# Patient Record
Sex: Male | Born: 1940
Health system: Southern US, Community
[De-identification: ages and names within clinical notes are randomized; demographics above are authoritative.]

## PROBLEM LIST (undated history)

## (undated) DIAGNOSIS — I10 Essential (primary) hypertension: Secondary | ICD-10-CM

## (undated) DIAGNOSIS — E119 Type 2 diabetes mellitus without complications: Secondary | ICD-10-CM

## (undated) DIAGNOSIS — I82409 Acute embolism and thrombosis of unspecified deep veins of unspecified lower extremity: Secondary | ICD-10-CM

## (undated) DIAGNOSIS — N183 Chronic kidney disease, stage 3 unspecified: Secondary | ICD-10-CM

## (undated) DIAGNOSIS — K219 Gastro-esophageal reflux disease without esophagitis: Secondary | ICD-10-CM

## (undated) DIAGNOSIS — Z794 Long term (current) use of insulin: Secondary | ICD-10-CM

## (undated) DIAGNOSIS — IMO0001 Reserved for inherently not codable concepts without codable children: Secondary | ICD-10-CM

## (undated) DIAGNOSIS — D472 Monoclonal gammopathy: Secondary | ICD-10-CM

## (undated) DIAGNOSIS — C61 Malignant neoplasm of prostate: Secondary | ICD-10-CM

## (undated) DIAGNOSIS — I5032 Chronic diastolic (congestive) heart failure: Secondary | ICD-10-CM

## (undated) DIAGNOSIS — E7800 Pure hypercholesterolemia, unspecified: Secondary | ICD-10-CM

## (undated) HISTORY — DX: Gastro-esophageal reflux disease without esophagitis: K21.9

## (undated) HISTORY — PX: PROSTATECTOMY: SHX69

---

## 2013-03-07 DIAGNOSIS — C61 Malignant neoplasm of prostate: Secondary | ICD-10-CM

## 2013-03-07 HISTORY — DX: Malignant neoplasm of prostate: C61

## 2015-02-18 ENCOUNTER — Emergency Department
Admission: EM | Admit: 2015-02-18 | Discharge: 2015-02-18 | Disposition: A | Discharge status: Home / Self Care | Payer: Medicare (Managed Care) | Attending: Emergency Medicine | Admitting: Emergency Medicine

## 2015-02-18 ENCOUNTER — Emergency Department: Payer: Medicare (Managed Care)

## 2015-02-18 DIAGNOSIS — E119 Type 2 diabetes mellitus without complications: Secondary | ICD-10-CM | POA: Diagnosis not present

## 2015-02-18 DIAGNOSIS — Z794 Long term (current) use of insulin: Secondary | ICD-10-CM | POA: Insufficient documentation

## 2015-02-18 DIAGNOSIS — Z79899 Other long term (current) drug therapy: Secondary | ICD-10-CM | POA: Insufficient documentation

## 2015-02-18 DIAGNOSIS — M545 Low back pain: Secondary | ICD-10-CM | POA: Diagnosis present

## 2015-02-18 DIAGNOSIS — I10 Essential (primary) hypertension: Secondary | ICD-10-CM | POA: Diagnosis not present

## 2015-02-18 DIAGNOSIS — M5442 Lumbago with sciatica, left side: Secondary | ICD-10-CM | POA: Insufficient documentation

## 2015-02-18 HISTORY — DX: Malignant neoplasm of prostate: C61

## 2015-02-18 HISTORY — DX: Pure hypercholesterolemia, unspecified: E78.00

## 2015-02-18 HISTORY — DX: Essential (primary) hypertension: I10

## 2015-02-18 HISTORY — DX: Acute embolism and thrombosis of unspecified deep veins of unspecified lower extremity: I82.409

## 2015-02-18 MED ORDER — TRAMADOL HCL 50 MG PO TABS: 50.0000 mg | ORAL_TABLET | Freq: Four times a day (QID) | ORAL | Status: DC | PRN

## 2015-02-18 NOTE — ED Provider Notes (Signed)
[facility] Emergency Department Provider Note  ____________________________________________  Time seen: Approximately 11:27 AM  I have reviewed the triage vital signs and the nursing notes.   HISTORY  Chief Complaint Back Pain  HPI Jesse Dickson is a 74 y.o. male complaint of back pain for 3 years. Patient states that currently his left lower back hurts with radiation to his left leg for the past 2-3 weeks. He denies any injury to his back. He states that he has had back pain for approximately 3 years and was seeing someone in [place]. He states that his back was x-rayed one year ago that he was never told what was wrong with his back. Family member states that he was told to exercise and not to sit for long periods of time. Patient has not been taking any over-the-counter medication at home for his pain. He denies any urinary symptoms, fever, chills, nausea. Currently he rates his pain as a 10 out of 10.   Past Medical History  Diagnosis Date  . Hypertension   . Diabetes mellitus without complication ([place])   . CHF (congestive heart failure) ([place])   . DVT (deep venous thrombosis) ([place])   . Hypercholesteremia   . Prostate CA ([place])     There are no active problems to display for this patient.   Past Surgical History  Procedure Laterality Date  . Prostatectomy      Current Outpatient Rx  Name  Route  Sig  Dispense  Refill  . atorvastatin (LIPITOR) 20 MG tablet   Oral   Take 20 mg by mouth daily.         . carvedilol (COREG) 25 MG tablet   Oral   Take 25 mg by mouth 2 (two) times daily.         Marland Kitchen docusate sodium (COLACE) 100 MG capsule   Oral   Take 100 mg by mouth 2 (two) times daily.         . furosemide (LASIX) 40 MG tablet   Oral   Take 40 mg by mouth 2 (two) times daily.         . insulin aspart (NOVOLOG) 100 UNIT/ML injection   Subcutaneous   Inject into the skin 3 (three) times daily before meals. Sliding scale coverage          . insulin glargine (LANTUS) 100 UNIT/ML injection   Subcutaneous   Inject 20 Units into the skin at bedtime.         . tamsulosin (FLOMAX) 0.4 MG CAPS capsule   Oral   Take 0.4 mg by mouth at bedtime.         . traMADol (ULTRAM) 50 MG tablet   Oral   Take 1 tablet (50 mg total) by mouth every 6 (six) hours as needed.   20 tablet   0     Allergies Review of patient's allergies indicates not on file.  No family history on file.  Social History Social History  Substance Use Topics  . Smoking status: Never Smoker   . Smokeless tobacco: None  . Alcohol Use: Yes    Review of Systems Constitutional: No fever/chills Eyes: No visual changes. ENT: No sore throat. Cardiovascular: Denies chest pain. Respiratory: Denies shortness of breath. Gastrointestinal: No abdominal pain.  No nausea, no vomiting.   Genitourinary: Negative for dysuria. Musculoskeletal: Positive for back pain. Skin: Negative for rash. Neurological: Negative for headaches. Positive left leg radicular pain  10-point ROS otherwise negative.  ____________________________________________   PHYSICAL EXAM:  VITAL SIGNS: ED Triage Vitals  Enc Vitals Group     BP 02/18/15 1052 132/55 mmHg     Pulse Rate 02/18/15 1052 79     Resp 02/18/15 1052 16     Temp 02/18/15 1052 97.8 F (36.6 C)     Temp Source 02/18/15 1052 Oral     SpO2 02/18/15 1052 100 %     Weight 02/18/15 1052 190 lb (86.183 kg)     Height 02/18/15 1052 5\' 8"  (1.727 m)     Head Cir --      Peak Flow --      Pain Score 02/18/15 1105 10     Pain Loc --      Pain Edu? --      Excl. in [place]? --     Constitutional: Alert and oriented. Well appearing and in no acute distress. Eyes: Conjunctivae are normal. PERRL. EOMI. Head: Atraumatic. Nose: No congestion/rhinnorhea. Mouth/Throat: Mucous membranes are moist.  Oropharynx non-erythematous. Neck: No stridor.  Moderate tenderness on palpation of cervical spine posteriorly and  trapezius muscle to the left. Range of motion is restricted secondary to pain. Hematological/Lymphatic/Immunilogical: No cervical lymphadenopathy. Cardiovascular: Normal rate, regular rhythm. Grossly normal heart sounds.  Good peripheral circulation. Respiratory: Normal respiratory effort.  No retractions. Lungs CTAB. Gastrointestinal: Soft and nontender. No distention.  Musculoskeletal: No lower extremity tenderness nor edema.  No joint effusions. Neurologic:  Normal speech and language. No gross focal neurologic deficits are appreciated.  Reflexes are 1+ bilaterally. Skin:  Skin is warm, dry and intact. No rash noted. Psychiatric: Mood and affect are normal. Speech and behavior are normal.  ____________________________________________   LABS (all labs ordered are listed, but only abnormal results are displayed)  Labs Reviewed - No data to display   RADIOLOGY  Lumbar spine x-ray per radiologist shows L5-S1 disc space loss. No acute findings were seen. ____________________________________________   PROCEDURES  Procedure(s) performed: None  Critical Care performed: No  ____________________________________________   INITIAL IMPRESSION / ASSESSMENT AND PLAN / ED COURSE  Pertinent labs & imaging results that were available during my care of the patient were reviewed by me and considered in my medical decision making (see chart for details).  Patient was placed tramadol as needed for pain. He was given the name of the orthopedist on call to call and make an appointment since he does not have a PCP in this area.  Appointment with Dr. Marry Guan, he is encouraged to call. ____________________________________________   FINAL CLINICAL IMPRESSION(S) / ED DIAGNOSES  Final diagnoses:  Left-sided low back pain with left-sided sciatica      Johnn Hai, PA-C 02/18/15 1512  Eula Listen, MD 02/20/15 [phone]

## 2015-02-18 NOTE — Discharge Instructions (Signed)
You will need to  make an appointment with Dr. Marry Guan about your chronic back pain. Take tramadol as needed for pain. Continued to move frequently. You may continue using icy hot to your back but do not apply heating pad when using this ointment.

## 2015-02-18 NOTE — ED Notes (Signed)
Pt c/o left lower back pain that radiates into leg for the past couple of weeks.Jesse Dickson

## 2015-04-08 ENCOUNTER — Other Ambulatory Visit: Payer: Self-pay | Admitting: Orthopedic Surgery

## 2015-04-08 DIAGNOSIS — M5416 Radiculopathy, lumbar region: Secondary | ICD-10-CM

## 2015-04-08 DIAGNOSIS — M5136 Other intervertebral disc degeneration, lumbar region: Secondary | ICD-10-CM

## 2015-04-28 ENCOUNTER — Ambulatory Visit: Payer: Medicare (Managed Care)

## 2015-05-14 DIAGNOSIS — E7800 Pure hypercholesterolemia, unspecified: Secondary | ICD-10-CM | POA: Diagnosis not present

## 2015-05-14 DIAGNOSIS — I5022 Chronic systolic (congestive) heart failure: Secondary | ICD-10-CM | POA: Diagnosis not present

## 2015-05-14 DIAGNOSIS — E119 Type 2 diabetes mellitus without complications: Secondary | ICD-10-CM | POA: Diagnosis not present

## 2015-05-14 DIAGNOSIS — Z794 Long term (current) use of insulin: Secondary | ICD-10-CM | POA: Diagnosis not present

## 2015-05-14 DIAGNOSIS — M5442 Lumbago with sciatica, left side: Secondary | ICD-10-CM | POA: Diagnosis not present

## 2015-05-15 DIAGNOSIS — Z794 Long term (current) use of insulin: Secondary | ICD-10-CM | POA: Diagnosis not present

## 2015-05-15 DIAGNOSIS — E119 Type 2 diabetes mellitus without complications: Secondary | ICD-10-CM | POA: Diagnosis not present

## 2015-05-18 DIAGNOSIS — N183 Chronic kidney disease, stage 3 unspecified: Secondary | ICD-10-CM | POA: Insufficient documentation

## 2015-05-18 DIAGNOSIS — D638 Anemia in other chronic diseases classified elsewhere: Secondary | ICD-10-CM | POA: Insufficient documentation

## 2015-05-22 DIAGNOSIS — I5022 Chronic systolic (congestive) heart failure: Secondary | ICD-10-CM | POA: Diagnosis not present

## 2015-06-02 ENCOUNTER — Ambulatory Visit
Admission: RE | Admit: 2015-06-02 | Discharge: 2015-06-02 | Disposition: A | Discharge status: Home / Self Care | Payer: Medicare Other | Source: Ambulatory Visit | Attending: Orthopedic Surgery | Admitting: Orthopedic Surgery

## 2015-06-02 DIAGNOSIS — M5416 Radiculopathy, lumbar region: Secondary | ICD-10-CM

## 2015-06-02 DIAGNOSIS — M5136 Other intervertebral disc degeneration, lumbar region: Secondary | ICD-10-CM | POA: Diagnosis not present

## 2015-06-02 DIAGNOSIS — M4806 Spinal stenosis, lumbar region: Secondary | ICD-10-CM | POA: Diagnosis not present

## 2015-06-02 DIAGNOSIS — M5186 Other intervertebral disc disorders, lumbar region: Secondary | ICD-10-CM | POA: Diagnosis not present

## 2015-06-02 DIAGNOSIS — M5126 Other intervertebral disc displacement, lumbar region: Secondary | ICD-10-CM | POA: Diagnosis not present

## 2016-03-15 DIAGNOSIS — N183 Chronic kidney disease, stage 3 (moderate): Secondary | ICD-10-CM | POA: Diagnosis not present

## 2016-03-25 DIAGNOSIS — E1122 Type 2 diabetes mellitus with diabetic chronic kidney disease: Secondary | ICD-10-CM | POA: Diagnosis not present

## 2016-03-25 DIAGNOSIS — D631 Anemia in chronic kidney disease: Secondary | ICD-10-CM | POA: Diagnosis not present

## 2016-03-25 DIAGNOSIS — N183 Chronic kidney disease, stage 3 (moderate): Secondary | ICD-10-CM | POA: Diagnosis not present

## 2016-03-25 DIAGNOSIS — I10 Essential (primary) hypertension: Secondary | ICD-10-CM | POA: Diagnosis not present

## 2016-04-08 DIAGNOSIS — Z794 Long term (current) use of insulin: Secondary | ICD-10-CM | POA: Diagnosis not present

## 2016-04-08 DIAGNOSIS — E119 Type 2 diabetes mellitus without complications: Secondary | ICD-10-CM | POA: Diagnosis not present

## 2016-04-08 DIAGNOSIS — D638 Anemia in other chronic diseases classified elsewhere: Secondary | ICD-10-CM | POA: Diagnosis not present

## 2016-04-08 DIAGNOSIS — M5442 Lumbago with sciatica, left side: Secondary | ICD-10-CM | POA: Diagnosis not present

## 2016-04-08 DIAGNOSIS — G8929 Other chronic pain: Secondary | ICD-10-CM | POA: Diagnosis not present

## 2016-04-08 DIAGNOSIS — E7800 Pure hypercholesterolemia, unspecified: Secondary | ICD-10-CM | POA: Diagnosis not present

## 2016-04-08 DIAGNOSIS — N183 Chronic kidney disease, stage 3 (moderate): Secondary | ICD-10-CM | POA: Diagnosis not present

## 2016-06-20 DIAGNOSIS — E119 Type 2 diabetes mellitus without complications: Secondary | ICD-10-CM | POA: Diagnosis not present

## 2016-06-20 DIAGNOSIS — C61 Malignant neoplasm of prostate: Secondary | ICD-10-CM | POA: Diagnosis not present

## 2016-06-20 DIAGNOSIS — E784 Other hyperlipidemia: Secondary | ICD-10-CM | POA: Diagnosis not present

## 2016-06-20 DIAGNOSIS — R0602 Shortness of breath: Secondary | ICD-10-CM | POA: Diagnosis not present

## 2016-06-20 DIAGNOSIS — I10 Essential (primary) hypertension: Secondary | ICD-10-CM | POA: Diagnosis not present

## 2016-06-20 DIAGNOSIS — Z125 Encounter for screening for malignant neoplasm of prostate: Secondary | ICD-10-CM | POA: Diagnosis not present

## 2016-06-20 DIAGNOSIS — R5381 Other malaise: Secondary | ICD-10-CM | POA: Diagnosis not present

## 2016-06-20 DIAGNOSIS — R079 Chest pain, unspecified: Secondary | ICD-10-CM | POA: Diagnosis not present

## 2016-06-20 DIAGNOSIS — M5440 Lumbago with sciatica, unspecified side: Secondary | ICD-10-CM | POA: Diagnosis not present

## 2016-06-22 DIAGNOSIS — M5440 Lumbago with sciatica, unspecified side: Secondary | ICD-10-CM | POA: Diagnosis not present

## 2016-06-22 DIAGNOSIS — C61 Malignant neoplasm of prostate: Secondary | ICD-10-CM | POA: Diagnosis not present

## 2016-06-22 DIAGNOSIS — R079 Chest pain, unspecified: Secondary | ICD-10-CM | POA: Diagnosis not present

## 2016-07-05 DIAGNOSIS — M5440 Lumbago with sciatica, unspecified side: Secondary | ICD-10-CM | POA: Diagnosis not present

## 2016-07-05 DIAGNOSIS — C61 Malignant neoplasm of prostate: Secondary | ICD-10-CM | POA: Diagnosis not present

## 2016-07-05 DIAGNOSIS — R0602 Shortness of breath: Secondary | ICD-10-CM | POA: Diagnosis not present

## 2016-08-04 DIAGNOSIS — E875 Hyperkalemia: Secondary | ICD-10-CM | POA: Diagnosis not present

## 2016-08-04 DIAGNOSIS — D631 Anemia in chronic kidney disease: Secondary | ICD-10-CM | POA: Diagnosis not present

## 2016-08-04 DIAGNOSIS — N2581 Secondary hyperparathyroidism of renal origin: Secondary | ICD-10-CM | POA: Diagnosis not present

## 2016-08-04 DIAGNOSIS — I10 Essential (primary) hypertension: Secondary | ICD-10-CM | POA: Diagnosis not present

## 2016-08-04 DIAGNOSIS — N183 Chronic kidney disease, stage 3 (moderate): Secondary | ICD-10-CM | POA: Diagnosis not present

## 2016-08-06 ENCOUNTER — Emergency Department
Admission: EM | Admit: 2016-08-06 | Discharge: 2016-08-06 | Disposition: A | Discharge status: Home / Self Care | Payer: Medicare HMO | Attending: Emergency Medicine | Admitting: Emergency Medicine

## 2016-08-06 ENCOUNTER — Encounter: Payer: Self-pay | Admitting: Emergency Medicine

## 2016-08-06 DIAGNOSIS — Z79899 Other long term (current) drug therapy: Secondary | ICD-10-CM | POA: Insufficient documentation

## 2016-08-06 DIAGNOSIS — Z794 Long term (current) use of insulin: Secondary | ICD-10-CM | POA: Insufficient documentation

## 2016-08-06 DIAGNOSIS — I110 Hypertensive heart disease with heart failure: Secondary | ICD-10-CM | POA: Diagnosis not present

## 2016-08-06 DIAGNOSIS — Z8546 Personal history of malignant neoplasm of prostate: Secondary | ICD-10-CM | POA: Diagnosis not present

## 2016-08-06 DIAGNOSIS — I509 Heart failure, unspecified: Secondary | ICD-10-CM | POA: Diagnosis not present

## 2016-08-06 DIAGNOSIS — R319 Hematuria, unspecified: Secondary | ICD-10-CM | POA: Diagnosis not present

## 2016-08-06 DIAGNOSIS — E119 Type 2 diabetes mellitus without complications: Secondary | ICD-10-CM | POA: Insufficient documentation

## 2016-08-06 LAB — CBC
HCT: 28 % — ABNORMAL LOW (ref 40.0–52.0)
HEMOGLOBIN: 9.2 g/dL — AB (ref 13.0–18.0)
MCH: 29.9 pg (ref 26.0–34.0)
MCHC: 32.9 g/dL (ref 32.0–36.0)
MCV: 90.9 fL (ref 80.0–100.0)
Platelets: 204 10*3/uL (ref 150–440)
RBC: 3.08 MIL/uL — ABNORMAL LOW (ref 4.40–5.90)
RDW: 12.8 % (ref 11.5–14.5)
WBC: 6.1 10*3/uL (ref 3.8–10.6)

## 2016-08-06 LAB — COMPREHENSIVE METABOLIC PANEL
ALBUMIN: 3.9 g/dL (ref 3.5–5.0)
ALK PHOS: 91 U/L (ref 38–126)
ALT: 21 U/L (ref 17–63)
ANION GAP: 6 (ref 5–15)
AST: 16 U/L (ref 15–41)
BUN: 34 mg/dL — ABNORMAL HIGH (ref 6–20)
CO2: 28 mmol/L (ref 22–32)
Calcium: 9.4 mg/dL (ref 8.9–10.3)
Chloride: 106 mmol/L (ref 101–111)
Creatinine, Ser: 1.85 mg/dL — ABNORMAL HIGH (ref 0.61–1.24)
GFR calc Af Amer: 40 mL/min — ABNORMAL LOW (ref >60)
GFR calc non Af Amer: 34 mL/min — ABNORMAL LOW (ref >60)
GLUCOSE: 95 mg/dL (ref 65–99)
POTASSIUM: 5.1 mmol/L (ref 3.5–5.1)
Sodium: 140 mmol/L (ref 135–145)
Total Bilirubin: 0.6 mg/dL (ref 0.3–1.2)
Total Protein: 7 g/dL (ref 6.5–8.1)

## 2016-08-06 LAB — URINALYSIS, COMPLETE (UACMP) WITH MICROSCOPIC
Bacteria, UA: NONE SEEN
Bilirubin Urine: NEGATIVE
GLUCOSE, UA: NEGATIVE mg/dL
KETONES UR: NEGATIVE mg/dL
Leukocytes, UA: NEGATIVE
NITRITE: NEGATIVE
PROTEIN: 100 mg/dL — AB
Specific Gravity, Urine: 1.016 (ref 1.005–1.030)
pH: 5 (ref 5.0–8.0)

## 2016-08-06 NOTE — ED Provider Notes (Signed)
[facility] Emergency Department Provider Note   ____________________________________________    I have reviewed the triage vital signs and the nursing notes.   HISTORY  Chief Complaint Hematuria     HPI Jesse Dickson is a 76 y.o. male who presents with complaints of blood in his urine. Patient reports he is urinating today and noticed a reddish tinge to his urine. He denies dysuria. No abdominal pain. No fevers or chills. No back pain. No difficulty urinating. He does have a distant history of prostate cancer. No history of kidney disease. No flank pain.   Past Medical History:  Diagnosis Date  . CHF (congestive heart failure) ([place])   . Diabetes mellitus without complication ([place])   . DVT (deep venous thrombosis) ([place])   . Hypercholesteremia   . Hypertension   . Prostate CA ([place])     There are no active problems to display for this patient.   Past Surgical History:  Procedure Laterality Date  . PROSTATECTOMY      Prior to Admission medications   Medication Sig Start Date End Date Taking? Authorizing Provider  atorvastatin (LIPITOR) 20 MG tablet Take 20 mg by mouth daily.    [provider]  carvedilol (COREG) 25 MG tablet Take 25 mg by mouth 2 (two) times daily.    [provider]  docusate sodium (COLACE) 100 MG capsule Take 100 mg by mouth 2 (two) times daily.    [provider]  furosemide (LASIX) 40 MG tablet Take 40 mg by mouth 2 (two) times daily.    [provider]  insulin aspart (NOVOLOG) 100 UNIT/ML injection Inject into the skin 3 (three) times daily before meals. Sliding scale coverage    [provider]  insulin glargine (LANTUS) 100 UNIT/ML injection Inject 20 Units into the skin at bedtime.    [provider]  tamsulosin (FLOMAX) 0.4 MG CAPS capsule Take 0.4 mg by mouth at bedtime.    [provider]  traMADol (ULTRAM) 50 MG tablet Take 1 tablet (50 mg  total) by mouth every 6 (six) hours as needed. 02/18/15   Johnn Hai, PA-C     Allergies Patient has no known allergies.  History reviewed. No pertinent family history.  Social History Social History  Substance Use Topics  . Smoking status: Never Smoker  . Smokeless tobacco: Never Used  . Alcohol use Yes    Review of Systems  Constitutional: No fever/chills Eyes: No visual changes.  ENT: No sore throat. Cardiovascular: Denies chest pain. Respiratory: Denies shortness of breath. Gastrointestinal: No abdominal pain.  No nausea, no vomiting.   Genitourinary: Negative for dysuria. Musculoskeletal: Negative for back pain. Skin: Negative for rash. Neurological: Negative for headaches    ____________________________________________   PHYSICAL EXAM:  VITAL SIGNS: ED Triage Vitals  Enc Vitals Group     BP 08/06/16 1240 122/61     Pulse Rate 08/06/16 1240 67     Resp 08/06/16 1240 16     Temp 08/06/16 1240 98 F (36.7 C)     Temp Source 08/06/16 1240 Oral     SpO2 08/06/16 1240 99 %     Weight 08/06/16 1240 86.2 kg (190 lb)     Height --      Head Circumference --      Peak Flow --      Pain Score 08/06/16 1238 0     Pain Loc --      Pain Edu? --  Excl. in [place]? --     Constitutional: Alert and oriented. No acute distress. Pleasant and interactive Eyes: Conjunctivae are normal.   Mouth/Throat: Mucous membranes are moist.    Cardiovascular: Normal rate, regular rhythm. Grossly normal heart sounds.  Good peripheral circulation. Respiratory: Normal respiratory effort.  No retractions. Lungs CTAB. Gastrointestinal: Soft and nontender. No distention.  No CVA tenderness. Genitourinary: Normal exam Musculoskeletal:   Warm and well perfused Neurologic:  Normal speech and language. No gross focal neurologic deficits are appreciated.  Skin:  Skin is warm, dry and intact. No rash noted. Psychiatric: Mood and affect are normal. Speech and behavior are  normal.  ____________________________________________   LABS (all labs ordered are listed, but only abnormal results are displayed)  Labs Reviewed  URINALYSIS, COMPLETE (UACMP) WITH MICROSCOPIC - Abnormal; Notable for the following:       Result Value   Color, Urine YELLOW (*)    APPearance HAZY (*)    Hgb urine dipstick MODERATE (*)    Protein, ur 100 (*)    Squamous Epithelial / LPF 0-5 (*)    All other components within normal limits  CBC - Abnormal; Notable for the following:    RBC 3.08 (*)    Hemoglobin 9.2 (*)    HCT 28.0 (*)    All other components within normal limits  COMPREHENSIVE METABOLIC PANEL - Abnormal; Notable for the following:    BUN 34 (*)    Creatinine, Ser 1.85 (*)    GFR calc non Af Amer 34 (*)    GFR calc Af Amer 40 (*)    All other components within normal limits  URINE CULTURE   ____________________________________________  EKG  None ____________________________________________  RADIOLOGY  None ____________________________________________   PROCEDURES  Procedure(s) performed: No    Critical Care performed: No ____________________________________________   INITIAL IMPRESSION / ASSESSMENT AND PLAN / ED COURSE  Pertinent labs & imaging results that were available during my care of the patient were reviewed by me and considered in my medical decision making (see chart for details).  Urinalysis does show red blood cells but no evidence of infection, lab work is overall unremarkable although he has a mild elevation of BUN/creatinine, encouraged increased by mouth hydration. We will send urine culture and the patient follow up with urology for further evaluation of his hematuria. Not consistent with kidney stone or UTI    ____________________________________________   FINAL CLINICAL IMPRESSION(S) / ED DIAGNOSES  Final diagnoses:  Hematuria, unspecified type      NEW MEDICATIONS STARTED DURING THIS VISIT:  Discharge Medication  List as of 08/06/2016  3:08 PM       Note:  This document was prepared using Dragon voice recognition software and may include unintentional dictation errors.    Lavonia Drafts, MD 08/06/16 [phone]

## 2016-08-06 NOTE — ED Triage Notes (Signed)
Pt to ed with c/o bloody urine since yesterday.  Denies pain.

## 2016-08-08 LAB — URINE CULTURE

## 2016-08-16 DIAGNOSIS — M5440 Lumbago with sciatica, unspecified side: Secondary | ICD-10-CM | POA: Diagnosis not present

## 2016-08-16 DIAGNOSIS — C61 Malignant neoplasm of prostate: Secondary | ICD-10-CM | POA: Diagnosis not present

## 2016-08-31 DIAGNOSIS — R69 Illness, unspecified: Secondary | ICD-10-CM | POA: Diagnosis not present

## 2016-09-13 ENCOUNTER — Inpatient Hospital Stay: Payer: Medicare HMO | Attending: Oncology | Admitting: Oncology

## 2016-09-13 ENCOUNTER — Encounter: Payer: Self-pay | Admitting: Oncology

## 2016-09-13 ENCOUNTER — Inpatient Hospital Stay: Payer: Medicare HMO

## 2016-09-13 VITALS — BP 137/61 | HR 72 | Temp 96.5°F | Resp 18 | Ht 66.93 in | Wt 186.5 lb

## 2016-09-13 DIAGNOSIS — M545 Low back pain, unspecified: Secondary | ICD-10-CM | POA: Insufficient documentation

## 2016-09-13 DIAGNOSIS — E7800 Pure hypercholesterolemia, unspecified: Secondary | ICD-10-CM | POA: Insufficient documentation

## 2016-09-13 DIAGNOSIS — I130 Hypertensive heart and chronic kidney disease with heart failure and stage 1 through stage 4 chronic kidney disease, or unspecified chronic kidney disease: Secondary | ICD-10-CM | POA: Diagnosis not present

## 2016-09-13 DIAGNOSIS — Z7982 Long term (current) use of aspirin: Secondary | ICD-10-CM

## 2016-09-13 DIAGNOSIS — I5032 Chronic diastolic (congestive) heart failure: Secondary | ICD-10-CM

## 2016-09-13 DIAGNOSIS — N189 Chronic kidney disease, unspecified: Secondary | ICD-10-CM

## 2016-09-13 DIAGNOSIS — R5381 Other malaise: Secondary | ICD-10-CM | POA: Insufficient documentation

## 2016-09-13 DIAGNOSIS — Z809 Family history of malignant neoplasm, unspecified: Secondary | ICD-10-CM | POA: Diagnosis not present

## 2016-09-13 DIAGNOSIS — Z794 Long term (current) use of insulin: Secondary | ICD-10-CM

## 2016-09-13 DIAGNOSIS — K219 Gastro-esophageal reflux disease without esophagitis: Secondary | ICD-10-CM | POA: Diagnosis not present

## 2016-09-13 DIAGNOSIS — R531 Weakness: Secondary | ICD-10-CM | POA: Insufficient documentation

## 2016-09-13 DIAGNOSIS — R5383 Other fatigue: Secondary | ICD-10-CM | POA: Insufficient documentation

## 2016-09-13 DIAGNOSIS — IMO0001 Reserved for inherently not codable concepts without codable children: Secondary | ICD-10-CM | POA: Insufficient documentation

## 2016-09-13 DIAGNOSIS — Z79899 Other long term (current) drug therapy: Secondary | ICD-10-CM

## 2016-09-13 DIAGNOSIS — D8989 Other specified disorders involving the immune mechanism, not elsewhere classified: Secondary | ICD-10-CM | POA: Insufficient documentation

## 2016-09-13 DIAGNOSIS — M549 Dorsalgia, unspecified: Secondary | ICD-10-CM | POA: Insufficient documentation

## 2016-09-13 DIAGNOSIS — N183 Chronic kidney disease, stage 3 (moderate): Secondary | ICD-10-CM

## 2016-09-13 DIAGNOSIS — E119 Type 2 diabetes mellitus without complications: Secondary | ICD-10-CM | POA: Diagnosis not present

## 2016-09-13 DIAGNOSIS — G8929 Other chronic pain: Secondary | ICD-10-CM | POA: Diagnosis not present

## 2016-09-13 DIAGNOSIS — Z8546 Personal history of malignant neoplasm of prostate: Secondary | ICD-10-CM | POA: Insufficient documentation

## 2016-09-13 DIAGNOSIS — N2581 Secondary hyperparathyroidism of renal origin: Secondary | ICD-10-CM

## 2016-09-13 DIAGNOSIS — D631 Anemia in chronic kidney disease: Secondary | ICD-10-CM | POA: Insufficient documentation

## 2016-09-13 DIAGNOSIS — E1122 Type 2 diabetes mellitus with diabetic chronic kidney disease: Secondary | ICD-10-CM | POA: Insufficient documentation

## 2016-09-13 DIAGNOSIS — R0602 Shortness of breath: Secondary | ICD-10-CM

## 2016-09-13 DIAGNOSIS — I5033 Acute on chronic diastolic (congestive) heart failure: Secondary | ICD-10-CM | POA: Insufficient documentation

## 2016-09-13 DIAGNOSIS — M459 Ankylosing spondylitis of unspecified sites in spine: Secondary | ICD-10-CM | POA: Diagnosis not present

## 2016-09-13 DIAGNOSIS — D638 Anemia in other chronic diseases classified elsewhere: Secondary | ICD-10-CM

## 2016-09-13 DIAGNOSIS — I129 Hypertensive chronic kidney disease with stage 1 through stage 4 chronic kidney disease, or unspecified chronic kidney disease: Secondary | ICD-10-CM

## 2016-09-13 LAB — COMPREHENSIVE METABOLIC PANEL
ALBUMIN: 3.9 g/dL (ref 3.5–5.0)
ALT: 17 U/L (ref 17–63)
AST: 16 U/L (ref 15–41)
Alkaline Phosphatase: 87 U/L (ref 38–126)
Anion gap: 10 (ref 5–15)
BUN: 34 mg/dL — AB (ref 6–20)
CHLORIDE: 101 mmol/L (ref 101–111)
CO2: 27 mmol/L (ref 22–32)
CREATININE: 1.99 mg/dL — AB (ref 0.61–1.24)
Calcium: 9.2 mg/dL (ref 8.9–10.3)
GFR calc Af Amer: 37 mL/min — ABNORMAL LOW (ref >60)
GFR, EST NON AFRICAN AMERICAN: 32 mL/min — AB (ref >60)
GLUCOSE: 154 mg/dL — AB (ref 65–99)
POTASSIUM: 4.8 mmol/L (ref 3.5–5.1)
Sodium: 138 mmol/L (ref 135–145)
Total Bilirubin: 0.6 mg/dL (ref 0.3–1.2)
Total Protein: 7 g/dL (ref 6.5–8.1)

## 2016-09-13 LAB — CBC WITH DIFFERENTIAL/PLATELET
BASOS PCT: 1 %
Basophils Absolute: 0.1 10*3/uL (ref 0–0.1)
Eosinophils Absolute: 0.2 10*3/uL (ref 0–0.7)
Eosinophils Relative: 3 %
HEMATOCRIT: 28.5 % — AB (ref 40.0–52.0)
HEMOGLOBIN: 9.6 g/dL — AB (ref 13.0–18.0)
LYMPHS PCT: 34 %
Lymphs Abs: 2.3 10*3/uL (ref 1.0–3.6)
MCH: 29.8 pg (ref 26.0–34.0)
MCHC: 33.6 g/dL (ref 32.0–36.0)
MCV: 88.6 fL (ref 80.0–100.0)
MONO ABS: 0.6 10*3/uL (ref 0.2–1.0)
Monocytes Relative: 9 %
NEUTROS ABS: 3.5 10*3/uL (ref 1.4–6.5)
NEUTROS PCT: 53 %
Platelets: 203 10*3/uL (ref 150–440)
RBC: 3.22 MIL/uL — ABNORMAL LOW (ref 4.40–5.90)
RDW: 13.1 % (ref 11.5–14.5)
WBC: 6.7 10*3/uL (ref 3.8–10.6)

## 2016-09-13 LAB — RETICULOCYTES
RBC.: 3.33 MIL/uL — AB (ref 4.40–5.90)
RETIC COUNT ABSOLUTE: 46.6 10*3/uL (ref 19.0–183.0)
Retic Ct Pct: 1.4 % (ref 0.4–3.1)

## 2016-09-13 LAB — IRON AND TIBC
Iron: 43 ug/dL — ABNORMAL LOW (ref 45–182)
SATURATION RATIOS: 18 % (ref 17.9–39.5)
TIBC: 240 ug/dL — AB (ref 250–450)
UIBC: 197 ug/dL

## 2016-09-13 LAB — FERRITIN: Ferritin: 371 ng/mL — ABNORMAL HIGH (ref 24–336)

## 2016-09-13 LAB — TSH: TSH: 1.247 u[IU]/mL (ref 0.350–4.500)

## 2016-09-13 LAB — FOLATE: FOLATE: 13 ng/mL (ref >5.9)

## 2016-09-13 LAB — VITAMIN B12: VITAMIN B 12: 570 pg/mL (ref 180–914)

## 2016-09-13 NOTE — Progress Notes (Signed)
Here for new pt evaluation. Vague medical and family hx given.

## 2016-09-13 NOTE — Progress Notes (Signed)
Hematology/Oncology Consult note [facility] [phone] [phone]  Patient Care Team: Cletis Athens, MD as PCP - General (Internal Medicine)   Name of the patient: Jesse Dickson  [id number]  1940/11/17    Reason for referral- anemia of chronic kidney disease    Referring physician- Dr. Holley Raring  Date of visit: 09/13/16   History of presenting illness- patient is a 76 year old male who was seen by Dr. Holley Raring for chronic kidney disease. He has type 2 diabetes, anemia of chronic kidney disease and stage III CKD along with hypertension and secondary hyperparathyroidism. In January 2018 his hemoglobin was10.7. Iron saturation 20% serum creatinine was elevated at 1.64 CBC in 08/05/2016 showed white count of 6.4, H&H of 9.1/28.6 with an MCV of 91 and a platelet count of 210. Serum creatinine elevated at 1.4. Patient also noted to have M spike in his urine of 13.2%  Reports fatigue and exertional SOB. Has chronic back pain. Denies other complaints  ECOG PS- 1  Pain scale- 9- chronic back pain   Review of systems- Review of Systems  Constitutional: Negative for chills, fever, malaise/fatigue and weight loss.  HENT: Negative for congestion, ear discharge and nosebleeds.   Eyes: Negative for blurred vision.  Respiratory: Negative for cough, hemoptysis, sputum production, shortness of breath and wheezing.   Cardiovascular: Negative for chest pain, palpitations, orthopnea and claudication.  Gastrointestinal: Negative for abdominal pain, blood in stool, constipation, diarrhea, heartburn, melena, nausea and vomiting.  Genitourinary: Negative for dysuria, flank pain, frequency, hematuria and urgency.  Musculoskeletal: Negative for back pain, joint pain and myalgias.  Skin: Negative for rash.  Neurological: Negative for dizziness, tingling, focal weakness, seizures, weakness and headaches.  Endo/Heme/Allergies: Does not bruise/bleed easily.    Psychiatric/Behavioral: Negative for depression and suicidal ideas. The patient does not have insomnia.     No Known Allergies  Patient Active Problem List   Diagnosis Date Noted  . Chronic diastolic congestive heart failure ([place]) 09/13/2016  . Chronic low back pain 09/13/2016  . GERD without esophagitis 09/13/2016  . Insulin dependent diabetes mellitus ([place]) 09/13/2016  . Pure hypercholesterolemia 09/13/2016  . Anemia of chronic disease 05/18/2015  . CKD (chronic kidney disease) stage 3, GFR 30-59 ml/min 05/18/2015     Past Medical History:  Diagnosis Date  . CHF (congestive heart failure) ([place])   . Diabetes mellitus without complication ([place])   . DVT (deep venous thrombosis) ([place])   . GERD (gastroesophageal reflux disease)   . Hypercholesteremia   . Hypertension   . Prostate CA ([place]) 2015   only surgery per pt     Past Surgical History:  Procedure Laterality Date  . PROSTATECTOMY      Social History   Social History  . Marital status: Married    Spouse name: N/A  . Number of children: N/A  . Years of education: N/A   Occupational History  . Not on file.   Social History Main Topics  . Smoking status: Never Smoker  . Smokeless tobacco: Never Used  . Alcohol use Yes  . Drug use: No  . Sexual activity: No   Other Topics Concern  . Not on file   Social History Narrative  . No narrative on file     Family History  Problem Relation Age of Onset  . Diabetes Mother   . COPD Mother   . Cancer Sister   . Cancer Sister   . Dementia Sister      Current Outpatient Prescriptions:  .  aspirin EC 81 MG tablet, Take by mouth., Disp: , Rfl:  .  atorvastatin (LIPITOR) 20 MG tablet, Take 20 mg by mouth daily., Disp: , Rfl:  .  carvedilol (COREG) 25 MG tablet, Take 25 mg by mouth 2 (two) times daily., Disp: , Rfl:  .  docusate sodium (COLACE) 100 MG capsule, Take 100 mg by mouth 2 (two) times daily., Disp: , Rfl:  .  famotidine (PEPCID) 20 MG tablet, Take by  mouth., Disp: , Rfl:  .  furosemide (LASIX) 40 MG tablet, Take 40 mg by mouth 2 (two) times daily., Disp: , Rfl:  .  gabapentin (NEURONTIN) 100 MG capsule, Take 200 mg by mouth daily., Disp: , Rfl: 2 .  glucose blood (BAYER CONTOUR TEST) test strip, Use 2 (two) times daily. Use as instructed., Disp: , Rfl:  .  insulin glargine (LANTUS) 100 UNIT/ML injection, Inject 25 Units into the skin at bedtime. , Disp: , Rfl:  .  Insulin Syringe-Needle U-100 (INSULIN SYRINGE 1CC/30GX5/16") 30G X 5/16" 1 ML MISC, Use 1 Dose 4 (four) times daily., Disp: , Rfl:  .  tamsulosin (FLOMAX) 0.4 MG CAPS capsule, Take 0.4 mg by mouth at bedtime., Disp: , Rfl:  .  traMADol (ULTRAM) 50 MG tablet, Take 1 tablet (50 mg total) by mouth every 6 (six) hours as needed., Disp: 20 tablet, Rfl: 0 .  polyethylene glycol (MIRALAX / GLYCOLAX) packet, Take by mouth., Disp: , Rfl:    Physical exam:  Vitals:   09/13/16 1457  BP: 137/61  Pulse: 72  Resp: 18  Temp: (!) 96.5 F (35.8 C)  TempSrc: Tympanic  Weight: 186 lb 8 oz (84.6 kg)  Height: 5' 6.93" (1.7 m)   Physical Exam  Constitutional: He is oriented to person, place, and time and well-developed, well-nourished, and in no distress.  HENT:  Head: Normocephalic and atraumatic.  Eyes: EOM are normal. Pupils are equal, round, and reactive to light.  Neck: Normal range of motion.  Cardiovascular: Normal rate, regular rhythm and normal heart sounds.   Pulmonary/Chest: Effort normal and breath sounds normal.  Abdominal: Soft. Bowel sounds are normal.  Neurological: He is alert and oriented to person, place, and time.  Skin: Skin is warm and dry.       CMP Latest Ref Rng & Units 08/06/2016  Glucose 65 - 99 mg/dL 95  BUN 6 - 20 mg/dL 34(H)  Creatinine 0.61 - 1.24 mg/dL 1.85(H)  Sodium 135 - 145 mmol/L 140  Potassium 3.5 - 5.1 mmol/L 5.1  Chloride 101 - 111 mmol/L 106  CO2 22 - 32 mmol/L 28  Calcium 8.9 - 10.3 mg/dL 9.4  Total Protein 6.5 - 8.1 g/dL 7.0  Total  Bilirubin 0.3 - 1.2 mg/dL 0.6  Alkaline Phos 38 - 126 U/L 91  AST 15 - 41 U/L 16  ALT 17 - 63 U/L 21   CBC Latest Ref Rng & Units 08/06/2016  WBC 3.8 - 10.6 K/uL 6.1  Hemoglobin 13.0 - 18.0 g/dL 9.2(L)  Hematocrit 40.0 - 52.0 % 28.0(L)  Platelets 150 - 440 K/uL 204     Assessment and plan- Patient is a 76 y.o. male referred for anemia of chronic kidney disease  Today I will check cbc with diff, cmp, iron studies, ferritin, b12, folate, retic count, haptoglobin, myeloma panel, TSH and UPEP and urine IFE. I will see her back in 2 weeks time and discuss further management. If no other cause for anemia apparent, we could consider EPO shots for  her if his hb is consistently less than 10.   Thank you for this kind referral and the opportunity to participate in the care of this patient   Visit Diagnosis 1. Anemia of chronic renal failure, unspecified CKD stage     Dr. Randa Evens, MD, MPH [place] [facility] Pager- [phone] 09/13/2016

## 2016-09-27 ENCOUNTER — Encounter: Payer: Self-pay | Admitting: Oncology

## 2016-09-27 ENCOUNTER — Inpatient Hospital Stay (HOSPITAL_BASED_OUTPATIENT_CLINIC_OR_DEPARTMENT_OTHER): Payer: Medicare HMO | Admitting: Oncology

## 2016-09-27 VITALS — BP 103/60 | HR 69 | Temp 97.6°F | Ht 68.0 in | Wt 188.3 lb

## 2016-09-27 DIAGNOSIS — R5383 Other fatigue: Secondary | ICD-10-CM

## 2016-09-27 DIAGNOSIS — R531 Weakness: Secondary | ICD-10-CM

## 2016-09-27 DIAGNOSIS — Z794 Long term (current) use of insulin: Secondary | ICD-10-CM | POA: Diagnosis not present

## 2016-09-27 DIAGNOSIS — R0602 Shortness of breath: Secondary | ICD-10-CM

## 2016-09-27 DIAGNOSIS — K219 Gastro-esophageal reflux disease without esophagitis: Secondary | ICD-10-CM

## 2016-09-27 DIAGNOSIS — E1122 Type 2 diabetes mellitus with diabetic chronic kidney disease: Secondary | ICD-10-CM | POA: Diagnosis not present

## 2016-09-27 DIAGNOSIS — G8929 Other chronic pain: Secondary | ICD-10-CM

## 2016-09-27 DIAGNOSIS — D8989 Other specified disorders involving the immune mechanism, not elsewhere classified: Secondary | ICD-10-CM

## 2016-09-27 DIAGNOSIS — Z8546 Personal history of malignant neoplasm of prostate: Secondary | ICD-10-CM

## 2016-09-27 DIAGNOSIS — N2581 Secondary hyperparathyroidism of renal origin: Secondary | ICD-10-CM

## 2016-09-27 DIAGNOSIS — E119 Type 2 diabetes mellitus without complications: Secondary | ICD-10-CM | POA: Diagnosis not present

## 2016-09-27 DIAGNOSIS — I130 Hypertensive heart and chronic kidney disease with heart failure and stage 1 through stage 4 chronic kidney disease, or unspecified chronic kidney disease: Secondary | ICD-10-CM | POA: Diagnosis not present

## 2016-09-27 DIAGNOSIS — E7800 Pure hypercholesterolemia, unspecified: Secondary | ICD-10-CM

## 2016-09-27 DIAGNOSIS — Z809 Family history of malignant neoplasm, unspecified: Secondary | ICD-10-CM

## 2016-09-27 DIAGNOSIS — Z7982 Long term (current) use of aspirin: Secondary | ICD-10-CM

## 2016-09-27 DIAGNOSIS — I129 Hypertensive chronic kidney disease with stage 1 through stage 4 chronic kidney disease, or unspecified chronic kidney disease: Secondary | ICD-10-CM | POA: Diagnosis not present

## 2016-09-27 DIAGNOSIS — M549 Dorsalgia, unspecified: Secondary | ICD-10-CM

## 2016-09-27 DIAGNOSIS — D631 Anemia in chronic kidney disease: Secondary | ICD-10-CM

## 2016-09-27 DIAGNOSIS — R5381 Other malaise: Secondary | ICD-10-CM

## 2016-09-27 DIAGNOSIS — Z79899 Other long term (current) drug therapy: Secondary | ICD-10-CM

## 2016-09-27 DIAGNOSIS — N183 Chronic kidney disease, stage 3 (moderate): Secondary | ICD-10-CM

## 2016-09-27 DIAGNOSIS — I5032 Chronic diastolic (congestive) heart failure: Secondary | ICD-10-CM

## 2016-09-27 NOTE — Progress Notes (Signed)
Patient here for follow up states he has been feeling tired lately and shrot of breath on excertion.

## 2016-09-28 ENCOUNTER — Other Ambulatory Visit: Payer: Self-pay | Admitting: Internal Medicine

## 2016-09-29 NOTE — Progress Notes (Signed)
Hematology/Oncology Consult note [facility]  Telephone:([phone] [phone]  Patient Care Team: Cletis Athens, MD as PCP - General (Internal Medicine)   Name of the patient: Jesse Dickson  [id number]  [birth year]   Date of visit: 09/29/16  Diagnosis- 1. Anemia of chronic kidney disease  2. 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 Holley Raring for chronic kidney disease. He has type 2 diabetes, anemia of chronic kidney disease and stage III CKD along with hypertension and secondary hyperparathyroidism. In January 2018 his hemoglobin was10.7. Iron saturation 20% serum creatinine was elevated at 1.64 CBC in 08/05/2016 showed white count of 6.4, H&H of 9.1/28.6 with an MCV of 91 and a platelet count of 210. Serum creatinine elevated at 1.4. Patient also noted to have M spike in his urine of 13.2%  Results of bloodwork from 09/13/2016 were as follows: CBC showed white count of 6.7, H&H of 9.6/28.5 and a platelet count of 238. CMP was significant for elevated BUN of 34 and creatinine of 1.99. Blood sugar was elevated at 154. TSH was within normal limits. B12 folate and iron studies were within normal limits. Ferritin was high normal at 371. Haptoglobin was elevated at 432. Multiple myeloma panel revealed a monoclonal free lambda light chains. Quantitative immunoglobulins are within normal limits. Serum free lambda Light chain ratio was elevated at 50. Random urine protein electrophoresis and immunofixation revealed Bence Jones protein positive lambda type. M spike of 14.6% noted total protein in the random sample was 110 mg/dL   Interval history- reports feeling tired and has chronic back pain.  ECOG PS- 2 Pain scale- 4  Review of systems- Review of Systems  Constitutional: Positive for malaise/fatigue.  Negative for chills, fever and weight loss.  HENT: Negative for congestion, ear discharge and nosebleeds.   Eyes: Negative for blurred vision.  Respiratory: Positive for shortness of breath. Negative for cough, hemoptysis, sputum production and wheezing.   Cardiovascular: Negative for chest pain, palpitations, orthopnea and claudication.  Gastrointestinal: Negative for abdominal pain, blood in stool, constipation, diarrhea, heartburn, melena, nausea and vomiting.  Genitourinary: Negative for dysuria, flank pain, frequency, hematuria and urgency.  Musculoskeletal: Positive for back pain. Negative for joint pain and myalgias.  Skin: Negative for rash.  Neurological: Positive for weakness. Negative for dizziness, tingling, focal weakness, seizures and headaches.  Endo/Heme/Allergies: Does not bruise/bleed easily.  Psychiatric/Behavioral: Negative for depression and suicidal ideas. The patient does not have insomnia.       No Known Allergies   Past Medical History:  Diagnosis Date  . CHF (congestive heart failure) ([place])   . Diabetes mellitus without complication ([place])   . DVT (deep venous thrombosis) ([place])   . GERD (gastroesophageal reflux disease)   . Hypercholesteremia   . Hypertension   . Prostate CA ([place]) 2015   only surgery per pt     Past Surgical History:  Procedure Laterality Date  . PROSTATECTOMY      Social History   Social History  . Marital status: Married    Spouse name: N/A  . Number of children: N/A  . Years of education: N/A   Occupational History  . Not on file.   Social History Main Topics  . Smoking status: Never Smoker  . Smokeless tobacco: Never Used  . Alcohol use Yes  . Drug use: No  .  Sexual activity: No   Other Topics Concern  . Not on file   Social History Narrative  . No narrative on file    Family History  Problem Relation Age of Onset  . Diabetes Mother   . COPD Mother   . Cancer Sister   . Cancer Sister   . Dementia Sister        Current Outpatient Prescriptions:  .  aspirin EC 81 MG tablet, Take by mouth., Disp: , Rfl:  .  atorvastatin (LIPITOR) 20 MG tablet, Take 20 mg by mouth daily., Disp: , Rfl:  .  carvedilol (COREG) 25 MG tablet, Take 25 mg by mouth 2 (two) times daily., Disp: , Rfl:  .  docusate sodium (COLACE) 100 MG capsule, Take 100 mg by mouth 2 (two) times daily., Disp: , Rfl:  .  famotidine (PEPCID) 20 MG tablet, Take by mouth., Disp: , Rfl:  .  furosemide (LASIX) 40 MG tablet, Take 40 mg by mouth 2 (two) times daily., Disp: , Rfl:  .  gabapentin (NEURONTIN) 100 MG capsule, Take 200 mg by mouth daily., Disp: , Rfl: 2 .  glucose blood (BAYER CONTOUR TEST) test strip, Use 2 (two) times daily. Use as instructed., Disp: , Rfl:  .  insulin glargine (LANTUS) 100 UNIT/ML injection, Inject 25 Units into the skin at bedtime. , Disp: , Rfl:  .  Insulin Syringe-Needle U-100 (INSULIN SYRINGE 1CC/30GX5/16") 30G X 5/16" 1 ML MISC, Use 1 Dose 4 (four) times daily., Disp: , Rfl:  .  polyethylene glycol (MIRALAX / GLYCOLAX) packet, Take by mouth., Disp: , Rfl:  .  tamsulosin (FLOMAX) 0.4 MG CAPS capsule, Take 0.4 mg by mouth at bedtime., Disp: , Rfl:  .  traMADol (ULTRAM) 50 MG tablet, Take 1 tablet (50 mg total) by mouth every 6 (six) hours as needed., Disp: 20 tablet, Rfl: 0  Physical exam:  Vitals:   09/27/16 1154  BP: 103/60  Pulse: 69  Temp: 97.6 F (36.4 C)  TempSrc: Tympanic  Weight: 188 lb 4.8 oz (85.4 kg)  Height: '5\' 8"'$  (1.727 m)   Physical Exam  Constitutional: He is oriented to person, place, and time and well-developed, well-nourished, and in no distress.  Ambulates with a cane  HENT:  Head: Normocephalic and atraumatic.  Eyes: Pupils are equal, round, and reactive to light. EOM are normal.  Neck: Normal range of motion.  Cardiovascular: Normal rate, regular rhythm and normal heart sounds.   Pulmonary/Chest: Effort normal and breath sounds normal.  Abdominal: Soft. Bowel sounds are normal.   Neurological: He is alert and oriented to person, place, and time.  Skin: Skin is warm and dry.     CMP Latest Ref Rng & Units 09/13/2016  Glucose 65 - 99 mg/dL 154(H)  BUN 6 - 20 mg/dL 34(H)  Creatinine 0.61 - 1.24 mg/dL 1.99(H)  Sodium 135 - 145 mmol/L 138  Potassium 3.5 - 5.1 mmol/L 4.8  Chloride 101 - 111 mmol/L 101  CO2 22 - 32 mmol/L 27  Calcium 8.9 - 10.3 mg/dL 9.2  Total Protein 6.5 - 8.1 g/dL 7.0  Total Bilirubin 0.3 - 1.2 mg/dL 0.6  Alkaline Phos 38 - 126 U/L 87  AST 15 - 41 U/L 16  ALT 17 - 63 U/L 17   CBC Latest Ref Rng & Units 09/13/2016  WBC 3.8 - 10.6 K/uL 6.7  Hemoglobin 13.0 - 18.0 g/dL 9.6(L)  Hematocrit 40.0 - 52.0 % 28.5(L)  Platelets 150 - 440 K/uL 203  Assessment and plan- Patient is a 76 y.o. male referred for anemia of chronic kidney disease  I discussed the results of the blood work with the patient which shows abnormal free light chain ratio of 50. Patient also noted to have free light chains in his serum as well as urine. Light chain disease could be seen in conditions such as multiple myeloma, waldenstorms macroglobulinemia or amyloidosis. I will therefore proceed with bone marrow biopsy for definitive diagnosis. I will also obtain 24-hour urine protein as well as urine protein electrophoresis, immunofixation and free light chain ratio in the urine. I will proceed with a PET scan to look for any bony lesions that may be suggestive of multiple myeloma. I will see the patient back in 2 weeks' time to discuss the results of his blood work and further workup and management   Visit Diagnosis 1. Light chain disease ([place])      Dr. Randa Evens, MD, MPH [facility] at [facility] Pager- [phone] 09/29/2016 8:08 AM

## 2016-10-02 DIAGNOSIS — D631 Anemia in chronic kidney disease: Secondary | ICD-10-CM | POA: Diagnosis not present

## 2016-10-02 DIAGNOSIS — I130 Hypertensive heart and chronic kidney disease with heart failure and stage 1 through stage 4 chronic kidney disease, or unspecified chronic kidney disease: Secondary | ICD-10-CM | POA: Diagnosis not present

## 2016-10-02 DIAGNOSIS — E1122 Type 2 diabetes mellitus with diabetic chronic kidney disease: Secondary | ICD-10-CM | POA: Diagnosis not present

## 2016-10-02 DIAGNOSIS — E119 Type 2 diabetes mellitus without complications: Secondary | ICD-10-CM | POA: Diagnosis not present

## 2016-10-02 DIAGNOSIS — Z794 Long term (current) use of insulin: Secondary | ICD-10-CM | POA: Diagnosis not present

## 2016-10-02 DIAGNOSIS — N2581 Secondary hyperparathyroidism of renal origin: Secondary | ICD-10-CM | POA: Diagnosis not present

## 2016-10-02 DIAGNOSIS — D8989 Other specified disorders involving the immune mechanism, not elsewhere classified: Secondary | ICD-10-CM | POA: Diagnosis not present

## 2016-10-02 DIAGNOSIS — N183 Chronic kidney disease, stage 3 (moderate): Secondary | ICD-10-CM | POA: Diagnosis not present

## 2016-10-02 DIAGNOSIS — I129 Hypertensive chronic kidney disease with stage 1 through stage 4 chronic kidney disease, or unspecified chronic kidney disease: Secondary | ICD-10-CM | POA: Diagnosis not present

## 2016-10-02 DIAGNOSIS — R531 Weakness: Secondary | ICD-10-CM | POA: Diagnosis not present

## 2016-10-03 ENCOUNTER — Other Ambulatory Visit: Payer: Self-pay | Admitting: *Deleted

## 2016-10-03 DIAGNOSIS — N183 Chronic kidney disease, stage 3 unspecified: Secondary | ICD-10-CM

## 2016-10-03 LAB — IFE AND PE, RANDOM URINE
% BETA, Urine: 34.4 %
ALPHA 1 URINE: 3.2 %
ALPHA 2 UR: 5.1 %
Albumin, U: 42.4 %
GAMMA GLOBULIN URINE: 14.9 %
M-SPIKE %, Urine: 14.6 % — ABNORMAL HIGH
TOTAL PROTEIN, URINE-UPE24: 110.6 mg/dL

## 2016-10-03 LAB — MULTIPLE MYELOMA PANEL, SERUM
ALPHA2 GLOB SERPL ELPH-MCNC: 1.1 g/dL — AB (ref 0.4–1.0)
Albumin SerPl Elph-Mcnc: 3.5 g/dL (ref 2.9–4.4)
Albumin/Glob SerPl: 1.1 (ref 0.7–1.7)
Alpha 1: 0.3 g/dL (ref 0.0–0.4)
B-Globulin SerPl Elph-Mcnc: 1 g/dL (ref 0.7–1.3)
Gamma Glob SerPl Elph-Mcnc: 1 g/dL (ref 0.4–1.8)
Globulin, Total: 3.3 g/dL (ref 2.2–3.9)
IGG (IMMUNOGLOBIN G), SERUM: 1003 mg/dL (ref 700–1600)
IGM, SERUM: 37 mg/dL (ref 15–143)
IgA: 132 mg/dL (ref 61–437)
TOTAL PROTEIN ELP: 6.8 g/dL (ref 6.0–8.5)

## 2016-10-03 LAB — KAPPA/LAMBDA LIGHT CHAINS
KAPPA FREE LGHT CHN: 24.5 mg/L — AB (ref 3.3–19.4)
KAPPA, LAMDA LIGHT CHAIN RATIO: 0.02 — AB (ref 0.26–1.65)
LAMDA FREE LIGHT CHAINS: 1376.2 mg/L — AB (ref 5.7–26.3)

## 2016-10-03 LAB — HAPTOGLOBIN: HAPTOGLOBIN: 432 mg/dL — AB (ref 34–200)

## 2016-10-04 ENCOUNTER — Ambulatory Visit: Payer: Medicare HMO

## 2016-10-04 ENCOUNTER — Other Ambulatory Visit: Payer: Self-pay | Admitting: *Deleted

## 2016-10-04 ENCOUNTER — Telehealth: Payer: Self-pay | Admitting: *Deleted

## 2016-10-04 DIAGNOSIS — D8989 Other specified disorders involving the immune mechanism, not elsewhere classified: Secondary | ICD-10-CM

## 2016-10-04 LAB — IFE+PROTEIN ELECTRO, 24-HR UR
% BETA, Urine: 35.1 %
ALBUMIN, U: 38.7 %
ALPHA 1 URINE: 2.4 %
ALPHA 2 UR: 7.6 %
GAMMA GLOBULIN URINE: 16.2 %
M-SPIKE %, Urine: 15.9 % — ABNORMAL HIGH
M-Spike, Mg/24 Hr: 91 mg/24 hr — ABNORMAL HIGH
TOTAL PROTEIN, URINE-UPE24: 28.7 mg/dL
Total Protein, Urine-Ur/day: 574 mg/24 hr — ABNORMAL HIGH (ref 30–150)
Total Volume: 2000

## 2016-10-04 NOTE — Telephone Encounter (Signed)
Called pt's house and spoke to pt's wife and let her know that the PET scan was denied. Dr. Janese Banks got on the phone with medical director at insurance and she was still denied. We are going to move ahead with a skeletal survey.  It is an xray and it does not require and appt. If [facility] could [facility] anytime Monday through friday 7:30 to 6:30 and check in at admitting desk and tell them he is here to have xray and once he is checked in he will go to next desk-radiology and have xray.  It wil take about 15-20 min. Dr Janese Banks would like him to have it before he has bone marrow bs which is 8/7.  Wife will tell pt and they will try to get over there before above date. She is agreeable to the plan

## 2016-10-05 ENCOUNTER — Telehealth: Payer: Self-pay | Admitting: *Deleted

## 2016-10-05 NOTE — Telephone Encounter (Signed)
I spoke to pt's wife yest. And I did cancel the pet scan due to insurance denial. Thank you.

## 2016-10-05 NOTE — Telephone Encounter (Signed)
[place] call to inform MD that the PET Scan had been denied for this patient. If you have questions you can call [phone] refer to case number [id number].

## 2016-10-06 ENCOUNTER — Ambulatory Visit: Payer: Medicare HMO

## 2016-10-10 ENCOUNTER — Other Ambulatory Visit: Payer: Self-pay | Admitting: Radiology

## 2016-10-11 ENCOUNTER — Other Ambulatory Visit (HOSPITAL_COMMUNITY)
Admission: RE | Admit: 2016-10-11 | Discharge: 2016-10-11 | Disposition: A | Discharge status: Home / Self Care | Payer: Medicare HMO | Source: Ambulatory Visit | Attending: Oncology | Admitting: Oncology

## 2016-10-11 ENCOUNTER — Ambulatory Visit: Admission: RE | Admit: 2016-10-11 | Payer: Medicare HMO | Source: Ambulatory Visit

## 2016-10-11 ENCOUNTER — Ambulatory Visit: Payer: Medicare HMO | Admitting: Oncology

## 2016-10-11 ENCOUNTER — Ambulatory Visit
Admission: RE | Admit: 2016-10-11 | Discharge: 2016-10-11 | Disposition: A | Discharge status: Home / Self Care | Payer: Medicare HMO | Source: Ambulatory Visit | Attending: Oncology | Admitting: Oncology

## 2016-10-11 DIAGNOSIS — D8989 Other specified disorders involving the immune mechanism, not elsewhere classified: Secondary | ICD-10-CM | POA: Insufficient documentation

## 2016-10-11 DIAGNOSIS — D4989 Neoplasm of unspecified behavior of other specified sites: Secondary | ICD-10-CM | POA: Diagnosis not present

## 2016-10-11 DIAGNOSIS — D649 Anemia, unspecified: Secondary | ICD-10-CM | POA: Diagnosis not present

## 2016-10-11 DIAGNOSIS — D808 Other immunodeficiencies with predominantly antibody defects: Secondary | ICD-10-CM | POA: Diagnosis not present

## 2016-10-11 LAB — CBC WITH DIFFERENTIAL/PLATELET
Basophils Absolute: 0 10*3/uL (ref 0–0.1)
Basophils Relative: 1 %
Eosinophils Absolute: 0.3 10*3/uL (ref 0–0.7)
Eosinophils Relative: 5 %
HCT: 28.9 % — ABNORMAL LOW (ref 40.0–52.0)
HEMOGLOBIN: 9.6 g/dL — AB (ref 13.0–18.0)
LYMPHS ABS: 1.4 10*3/uL (ref 1.0–3.6)
LYMPHS PCT: 25 %
MCH: 29.6 pg (ref 26.0–34.0)
MCHC: 33.3 g/dL (ref 32.0–36.0)
MCV: 88.7 fL (ref 80.0–100.0)
MONOS PCT: 9 %
Monocytes Absolute: 0.5 10*3/uL (ref 0.2–1.0)
NEUTROS PCT: 60 %
Neutro Abs: 3.4 10*3/uL (ref 1.4–6.5)
Platelets: 179 10*3/uL (ref 150–440)
RBC: 3.26 MIL/uL — AB (ref 4.40–5.90)
RDW: 13.7 % (ref 11.5–14.5)
WBC: 5.5 10*3/uL (ref 3.8–10.6)

## 2016-10-11 LAB — APTT: APTT: 31 s (ref 24–36)

## 2016-10-11 LAB — GLUCOSE, CAPILLARY: GLUCOSE-CAPILLARY: 130 mg/dL — AB (ref 65–99)

## 2016-10-11 LAB — PROTIME-INR
INR: 1.11
Prothrombin Time: 14.4 seconds (ref 11.4–15.2)

## 2016-10-11 MED ORDER — FENTANYL CITRATE (PF) 100 MCG/2ML IJ SOLN
INTRAMUSCULAR | Status: AC | PRN
  Administered 2016-10-11: 25 ug via INTRAVENOUS

## 2016-10-11 MED ORDER — SODIUM CHLORIDE 0.9 % IV SOLN
INTRAVENOUS | Status: DC
  Administered 2016-10-11: 10 mL/h via INTRAVENOUS

## 2016-10-11 MED ORDER — MIDAZOLAM HCL 2 MG/2ML IJ SOLN
INTRAMUSCULAR | Status: AC
  Filled 2016-10-11: qty 4

## 2016-10-11 MED ORDER — HEPARIN SOD (PORK) LOCK FLUSH 100 UNIT/ML IV SOLN
INTRAVENOUS | Status: AC
  Filled 2016-10-11: qty 5

## 2016-10-11 MED ORDER — HYDROCODONE-ACETAMINOPHEN 5-325 MG PO TABS
1.0000 | ORAL_TABLET | ORAL | Status: DC | PRN
  Filled 2016-10-11: qty 2

## 2016-10-11 MED ORDER — BUPIVACAINE HCL (PF) 0.25 % IJ SOLN
INTRAMUSCULAR | Status: AC
  Filled 2016-10-11: qty 30

## 2016-10-11 MED ORDER — FENTANYL CITRATE (PF) 100 MCG/2ML IJ SOLN
INTRAMUSCULAR | Status: AC
  Filled 2016-10-11: qty 2

## 2016-10-11 NOTE — Procedures (Signed)
CT Bone Marrow Biopsy without difficulty  Complications:  None  Blood Loss: none  See dictation in canopy pacs  

## 2016-10-11 NOTE — Discharge Instructions (Signed)
Bone Marrow Aspiration and Bone Marrow Biopsy, Adult, Care After °This sheet gives you information about how to care for yourself after your procedure. Your health care provider may also give you more specific instructions. If you have problems or questions, contact your health care provider. °What can I expect after the procedure? °After the procedure, it is common to have: °· Mild pain and tenderness. °· Swelling. °· Bruising. ° °Follow these instructions at home: °· Take over-the-counter or prescription medicines only as told by your health care provider. °· Do not take baths, swim, or use a hot tub until your health care provider approves. Ask if you can take a shower or have a sponge bath. °· Follow instructions from your health care provider about how to take care of the puncture site. Make sure you: °? Wash your hands with soap and water before you change your bandage (dressing). If soap and water are not available, use hand sanitizer. °? Change your dressing as told by your health care provider. °· Check your puncture site every day for signs of infection. Check for: °? More redness, swelling, or pain. °? More fluid or blood. °? Warmth. °? Pus or a bad smell. °· Return to your normal activities as told by your health care provider. Ask your health care provider what activities are safe for you. °· Do not drive for 24 hours if you were given a medicine to help you relax (sedative). °· Keep all follow-up visits as told by your health care provider. This is important. °Contact a health care provider if: °· You have more redness, swelling, or pain around the puncture site. °· You have more fluid or blood coming from the puncture site. °· Your puncture site feels warm to the touch. °· You have pus or a bad smell coming from the puncture site. °· You have a fever. °· Your pain is not controlled with medicine. °This information is not intended to replace advice given to you by your health care provider. Make sure  you discuss any questions you have with your health care provider. °Document Released: 09/10/2004 Document Revised: 09/11/2015 Document Reviewed: 08/05/2015 °Elsevier Interactive Patient Education © 2018 [employer] Inc. ° °

## 2016-10-14 ENCOUNTER — Ambulatory Visit: Payer: Medicare HMO | Admitting: Oncology

## 2016-10-31 ENCOUNTER — Encounter (HOSPITAL_COMMUNITY): Payer: Self-pay

## 2016-11-01 ENCOUNTER — Telehealth: Payer: Self-pay | Admitting: *Deleted

## 2016-11-01 ENCOUNTER — Ambulatory Visit
Admission: RE | Admit: 2016-11-01 | Discharge: 2016-11-01 | Disposition: A | Discharge status: Home / Self Care | Payer: Medicare HMO | Source: Ambulatory Visit | Attending: Oncology | Admitting: Oncology

## 2016-11-01 ENCOUNTER — Inpatient Hospital Stay: Payer: Medicare HMO | Attending: Oncology | Admitting: Oncology

## 2016-11-01 ENCOUNTER — Encounter: Payer: Self-pay | Admitting: Oncology

## 2016-11-01 VITALS — BP 110/63 | HR 77 | Temp 98.0°F | Ht 68.0 in | Wt 188.4 lb

## 2016-11-01 DIAGNOSIS — D8989 Other specified disorders involving the immune mechanism, not elsewhere classified: Secondary | ICD-10-CM | POA: Diagnosis not present

## 2016-11-01 DIAGNOSIS — D631 Anemia in chronic kidney disease: Secondary | ICD-10-CM | POA: Diagnosis not present

## 2016-11-01 DIAGNOSIS — Z79899 Other long term (current) drug therapy: Secondary | ICD-10-CM

## 2016-11-01 DIAGNOSIS — I10 Essential (primary) hypertension: Secondary | ICD-10-CM | POA: Diagnosis not present

## 2016-11-01 DIAGNOSIS — Z7982 Long term (current) use of aspirin: Secondary | ICD-10-CM

## 2016-11-01 DIAGNOSIS — K219 Gastro-esophageal reflux disease without esophagitis: Secondary | ICD-10-CM | POA: Diagnosis not present

## 2016-11-01 DIAGNOSIS — Z794 Long term (current) use of insulin: Secondary | ICD-10-CM

## 2016-11-01 DIAGNOSIS — C61 Malignant neoplasm of prostate: Secondary | ICD-10-CM | POA: Diagnosis not present

## 2016-11-01 DIAGNOSIS — M7989 Other specified soft tissue disorders: Secondary | ICD-10-CM | POA: Diagnosis not present

## 2016-11-01 DIAGNOSIS — I129 Hypertensive chronic kidney disease with stage 1 through stage 4 chronic kidney disease, or unspecified chronic kidney disease: Secondary | ICD-10-CM

## 2016-11-01 DIAGNOSIS — N183 Chronic kidney disease, stage 3 (moderate): Secondary | ICD-10-CM | POA: Diagnosis not present

## 2016-11-01 DIAGNOSIS — Z86718 Personal history of other venous thrombosis and embolism: Secondary | ICD-10-CM

## 2016-11-01 DIAGNOSIS — E119 Type 2 diabetes mellitus without complications: Secondary | ICD-10-CM

## 2016-11-01 DIAGNOSIS — D638 Anemia in other chronic diseases classified elsewhere: Secondary | ICD-10-CM

## 2016-11-01 DIAGNOSIS — I509 Heart failure, unspecified: Secondary | ICD-10-CM | POA: Diagnosis not present

## 2016-11-01 DIAGNOSIS — E7800 Pure hypercholesterolemia, unspecified: Secondary | ICD-10-CM | POA: Diagnosis not present

## 2016-11-01 LAB — CHROMOSOME ANALYSIS, BONE MARROW

## 2016-11-01 LAB — TISSUE HYBRIDIZATION (BONE MARROW)-NCBH

## 2016-11-01 NOTE — Telephone Encounter (Signed)
Called dr. Gari Crown and asked him to add on congo red stain and he said he will and put the results in addendum.

## 2016-11-01 NOTE — Progress Notes (Signed)
Hematology/Oncology Consult note [facility]  Telephone:([phone] Fax:([phone]  Patient Care Team: Corky Downs, MD as PCP - General (Internal Medicine)   Name of the patient: Jesse Dickson  [id number]  [birth year]   Date of visit: 11/01/16  Diagnosis- 1. Anemia of chronic kidney disease  2. Possible light chain disease  Chief complaint/ Reason for visit- discuss bone marrow biopsy results  Heme/Onc history: patient is a 76 year old male who was seen by Dr. Cherylann Ratel for chronic kidney disease. He has type 2 diabetes, anemia of chronic kidney disease and stage III CKD along with hypertension and secondary hyperparathyroidism. In January 2018 hishemoglobin was10.7. Iron saturation 20% serum creatinine was elevated at 1.64 CBC in 08/05/2016 showed white count of 6.4, H&H of 9.1/28.6 with an MCV of 91 and a platelet count of 210. Serum creatinine elevated at 1.4. Patient also noted to have M spike in his urine of13.2%  Results of bloodwork from 09/13/2016 were as follows: CBC showed white count of 6.7, H&H of 9.6/28.5 and a platelet count of 238. CMP was significant for elevated BUN of 34 and creatinine of 1.99. Blood sugar was elevated at 154. TSH was within normal limits. B12 folate and iron studies were within normal limits. Ferritin was high normal at 371. Haptoglobin was elevated at 432. Multiple myeloma panel revealed a monoclonal free lambda light chains. Quantitative immunoglobulins are within normal limits. Serum free lambda Light chain ratio was elevated at 50. Random urine protein electrophoresis and immunofixation revealed Bence Jones protein positive lambda type. M spike of 14.6% noted total protein in the random sample was 110 mg/dL  Bone marrow biopsy showed slightly increased number of plasma cells about 6% with lack of large aggregate partially. Immunohistochemical stain showed plasma cells are lambda light chain restricted consistent  with a plasma cell neoplasm. No molecular cytogenetic abnormalities were noted. 24-hour urine protein revealed 574 mg of protein and 91 mg of M spike. Immunofixation in the urine showed Bence Jones protein positive lambda type   Interval history- reports fatigue and back pain. Denies other complaints  ECOG PS- 1-2 Pain scale- 5 Opioid associated constipation- no  Review of systems- Review of Systems  Constitutional: Positive for malaise/fatigue. Negative for chills, fever and weight loss.  HENT: Negative for congestion, ear discharge and nosebleeds.   Eyes: Negative for blurred vision.  Respiratory: Negative for cough, hemoptysis, sputum production, shortness of breath and wheezing.   Cardiovascular: Negative for chest pain, palpitations, orthopnea and claudication.  Gastrointestinal: Negative for abdominal pain, blood in stool, constipation, diarrhea, heartburn, melena, nausea and vomiting.  Genitourinary: Negative for dysuria, flank pain, frequency, hematuria and urgency.  Musculoskeletal: Positive for back pain. Negative for joint pain and myalgias.  Skin: Negative for rash.  Neurological: Negative for dizziness, tingling, focal weakness, seizures, weakness and headaches.  Endo/Heme/Allergies: Does not bruise/bleed easily.  Psychiatric/Behavioral: Negative for depression and suicidal ideas. The patient does not have insomnia.       No Known Allergies   Past Medical History:  Diagnosis Date  . CHF (congestive heart failure) ([facility])   . Diabetes mellitus without complication ([facility])   . DVT (deep venous thrombosis) ([facility])   . GERD (gastroesophageal reflux disease)   . Hypercholesteremia   . Hypertension   . Prostate CA ([facility]) 2015   only surgery per pt     Past Surgical History:  Procedure Laterality Date  . PROSTATECTOMY      Social History   Social History  .  Marital status: Married    Spouse name: N/A  . Number of children: N/A  . Years of education: N/A    Occupational History  . Not on file.   Social History Main Topics  . Smoking status: Never Smoker  . Smokeless tobacco: Never Used  . Alcohol use Yes  . Drug use: No  . Sexual activity: No   Other Topics Concern  . Not on file   Social History Narrative  . No narrative on file    Family History  Problem Relation Age of Onset  . Diabetes Mother   . COPD Mother   . Cancer Sister   . Cancer Sister   . Dementia Sister      Current Outpatient Prescriptions:  .  aspirin EC 81 MG tablet, Take by mouth., Disp: , Rfl:  .  atorvastatin (LIPITOR) 20 MG tablet, Take 20 mg by mouth daily., Disp: , Rfl:  .  carvedilol (COREG) 25 MG tablet, Take 25 mg by mouth 2 (two) times daily., Disp: , Rfl:  .  docusate sodium (COLACE) 100 MG capsule, Take 100 mg by mouth 2 (two) times daily., Disp: , Rfl:  .  famotidine (PEPCID) 20 MG tablet, Take by mouth., Disp: , Rfl:  .  furosemide (LASIX) 40 MG tablet, Take 40 mg by mouth 2 (two) times daily., Disp: , Rfl:  .  gabapentin (NEURONTIN) 100 MG capsule, Take 200 mg by mouth daily., Disp: , Rfl: 2 .  glucose blood (BAYER CONTOUR TEST) test strip, Use 2 (two) times daily. Use as instructed., Disp: , Rfl:  .  insulin glargine (LANTUS) 100 UNIT/ML injection, Inject 25 Units into the skin at bedtime. , Disp: , Rfl:  .  Insulin Syringe-Needle U-100 (INSULIN SYRINGE 1CC/30GX5/16") 30G X 5/16" 1 ML MISC, Use 1 Dose 4 (four) times daily., Disp: , Rfl:  .  polyethylene glycol (MIRALAX / GLYCOLAX) packet, Take by mouth., Disp: , Rfl:  .  tamsulosin (FLOMAX) 0.4 MG CAPS capsule, Take 0.4 mg by mouth at bedtime., Disp: , Rfl:  .  traMADol (ULTRAM) 50 MG tablet, Take 1 tablet (50 mg total) by mouth every 6 (six) hours as needed., Disp: 20 tablet, Rfl: 0  Physical exam:  Vitals:   11/01/16 0924  BP: 110/63  Pulse: 77  Temp: 98 F (36.7 C)  TempSrc: Tympanic  Weight: 188 lb 6.4 oz (85.5 kg)  Height: 5\' 8"  (1.727 m)   Physical Exam  Constitutional: He  is oriented to person, place, and time and well-developed, well-nourished, and in no distress.  HENT:  Head: Normocephalic and atraumatic.  Eyes: Pupils are equal, round, and reactive to light. EOM are normal.  Neck: Normal range of motion.  Cardiovascular: Normal rate, regular rhythm and normal heart sounds.   Pulmonary/Chest: Effort normal and breath sounds normal.  Abdominal: Soft. Bowel sounds are normal.  Neurological: He is alert and oriented to person, place, and time.  Skin: Skin is warm and dry.     CMP Latest Ref Rng & Units 09/13/2016  Glucose 65 - 99 mg/dL 11/14/2016)  BUN 6 - 20 mg/dL 072(T)  Creatinine 82(E - 1.24 mg/dL 8.33)  Sodium 7.44(Z - 146 mmol/L 138  Potassium 3.5 - 5.1 mmol/L 4.8  Chloride 101 - 111 mmol/L 101  CO2 22 - 32 mmol/L 27  Calcium 8.9 - 10.3 mg/dL 9.2  Total Protein 6.5 - 8.1 g/dL 7.0  Total Bilirubin 0.3 - 1.2 mg/dL 0.6  Alkaline Phos 38 - 126 U/L  87  AST 15 - 41 U/L 16  ALT 17 - 63 U/L 17   CBC Latest Ref Rng & Units 10/11/2016  WBC 3.8 - 10.6 K/uL 5.5  Hemoglobin 13.0 - 18.0 g/dL 9.6(L)  Hematocrit 40.0 - 52.0 % 28.9(L)  Platelets 150 - 440 K/uL 179    No images are attached to the encounter.  Ct Bone Marrow Biopsy & Aspiration  Result Date: 10/11/2016 INDICATION: Light chain disease EXAM: CT-GUIDED BONE MARROW ASPIRATION AND BIOPSY MEDICATIONS: None. ANESTHESIA/SEDATION: Moderate (conscious) sedation was employed during this procedure. A total of Fentanyl 25 mcg was administered intravenously. Moderate Sedation Time: 10 minutes. The patient's level of consciousness and vital signs were monitored continuously by radiology nursing throughout the procedure under my direct supervision. FLUOROSCOPY TIME:  Not applicable COMPLICATIONS: None immediate. PROCEDURE: Informed written consent was obtained from the patient after a thorough discussion of the procedural risks, benefits and alternatives. All questions were addressed. Maximal Sterile Barrier Technique  was utilized including caps, mask, sterile gowns, sterile gloves, sterile drape, hand hygiene and skin antiseptic. A timeout was performed prior to the initiation of the procedure. Utilizing 0.25% Marcaine and CT fluoroscopic guidance, the bone biopsy needle was placed into the left iliac wing adjacent to the sacroiliac joint. Initial aspiration biopsy was performed and sent to pathology. Two bone marrow core biopsies were then obtained without difficulty. These were also sent to pathology. The patient tolerated the procedure well. Hemostasis was obtained at the puncture site. The puncture site was then dressed in the standard sterile manner. Patient was returned his room in satisfactory condition. IMPRESSION: Successful CT-guided bone marrow aspiration and core biopsy as described. Electronically Signed   By: Inez Catalina M.D.   On: 10/11/2016 11:03     Assessment and plan- Patient is a 76 y.o. malereferred for anemia of chronic kidney disease found to have light chain disease  Results of bone marrow biopsy, urine and peripheral blood work up reveal bence jones lambda light chain protein. He only has 6% plasma cells in his bone marrow and does not yet meet criteria for multiple myeloma. No heavy chain noted. I will obtain congo red staining on his bone marrow. He will proceed with bone survey today and if negative, will need PET/CT scan. No overt evidence of amyloidosis. No palpable hepatosplenomegaly. Last ECHO from 2017 was normal. Will check pro BNP. If above tests are inconclusive he may need kidney biopsy to ascertain etiology of ckd and if he has light chain disease involvement of his kidney  I will see him back in 1 month with cbc, cmp, free light chains, myeloma panel and 24 hour urine studies.    Visit Diagnosis 1. Anemia of chronic disease   2. Light chain disease, lambda type ([place])      Dr. Randa Evens, MD, MPH [facility] Pager- [phone] 11/01/2016 1:06  PM

## 2016-11-01 NOTE — Progress Notes (Signed)
Patient here for follow up. No changes since last appointment.  

## 2016-11-11 DIAGNOSIS — I10 Essential (primary) hypertension: Secondary | ICD-10-CM | POA: Diagnosis not present

## 2016-11-11 DIAGNOSIS — N2581 Secondary hyperparathyroidism of renal origin: Secondary | ICD-10-CM | POA: Diagnosis not present

## 2016-11-11 DIAGNOSIS — D631 Anemia in chronic kidney disease: Secondary | ICD-10-CM | POA: Diagnosis not present

## 2016-11-11 DIAGNOSIS — E875 Hyperkalemia: Secondary | ICD-10-CM | POA: Diagnosis not present

## 2016-11-11 DIAGNOSIS — N183 Chronic kidney disease, stage 3 (moderate): Secondary | ICD-10-CM | POA: Diagnosis not present

## 2016-11-15 DIAGNOSIS — C61 Malignant neoplasm of prostate: Secondary | ICD-10-CM | POA: Diagnosis not present

## 2016-11-15 DIAGNOSIS — E785 Hyperlipidemia, unspecified: Secondary | ICD-10-CM | POA: Diagnosis not present

## 2016-11-15 DIAGNOSIS — E119 Type 2 diabetes mellitus without complications: Secondary | ICD-10-CM | POA: Diagnosis not present

## 2016-11-15 DIAGNOSIS — M5440 Lumbago with sciatica, unspecified side: Secondary | ICD-10-CM | POA: Diagnosis not present

## 2016-11-22 ENCOUNTER — Other Ambulatory Visit: Payer: Self-pay

## 2016-11-22 ENCOUNTER — Inpatient Hospital Stay: Payer: Medicare HMO | Attending: Oncology

## 2016-11-22 DIAGNOSIS — G8929 Other chronic pain: Secondary | ICD-10-CM | POA: Diagnosis not present

## 2016-11-22 DIAGNOSIS — Z809 Family history of malignant neoplasm, unspecified: Secondary | ICD-10-CM | POA: Diagnosis not present

## 2016-11-22 DIAGNOSIS — Z794 Long term (current) use of insulin: Secondary | ICD-10-CM | POA: Diagnosis not present

## 2016-11-22 DIAGNOSIS — N2581 Secondary hyperparathyroidism of renal origin: Secondary | ICD-10-CM | POA: Insufficient documentation

## 2016-11-22 DIAGNOSIS — Z7982 Long term (current) use of aspirin: Secondary | ICD-10-CM | POA: Diagnosis not present

## 2016-11-22 DIAGNOSIS — D8989 Other specified disorders involving the immune mechanism, not elsewhere classified: Secondary | ICD-10-CM

## 2016-11-22 DIAGNOSIS — K219 Gastro-esophageal reflux disease without esophagitis: Secondary | ICD-10-CM | POA: Insufficient documentation

## 2016-11-22 DIAGNOSIS — D631 Anemia in chronic kidney disease: Secondary | ICD-10-CM | POA: Insufficient documentation

## 2016-11-22 DIAGNOSIS — C9000 Multiple myeloma not having achieved remission: Secondary | ICD-10-CM | POA: Insufficient documentation

## 2016-11-22 DIAGNOSIS — Z86718 Personal history of other venous thrombosis and embolism: Secondary | ICD-10-CM | POA: Diagnosis not present

## 2016-11-22 DIAGNOSIS — I509 Heart failure, unspecified: Secondary | ICD-10-CM | POA: Diagnosis not present

## 2016-11-22 DIAGNOSIS — Z79899 Other long term (current) drug therapy: Secondary | ICD-10-CM | POA: Insufficient documentation

## 2016-11-22 DIAGNOSIS — E119 Type 2 diabetes mellitus without complications: Secondary | ICD-10-CM | POA: Diagnosis not present

## 2016-11-22 DIAGNOSIS — E7800 Pure hypercholesterolemia, unspecified: Secondary | ICD-10-CM | POA: Diagnosis not present

## 2016-11-22 DIAGNOSIS — Z8546 Personal history of malignant neoplasm of prostate: Secondary | ICD-10-CM | POA: Insufficient documentation

## 2016-11-22 DIAGNOSIS — I129 Hypertensive chronic kidney disease with stage 1 through stage 4 chronic kidney disease, or unspecified chronic kidney disease: Secondary | ICD-10-CM | POA: Insufficient documentation

## 2016-11-22 DIAGNOSIS — N183 Chronic kidney disease, stage 3 (moderate): Secondary | ICD-10-CM | POA: Diagnosis not present

## 2016-11-22 DIAGNOSIS — M545 Low back pain: Secondary | ICD-10-CM | POA: Insufficient documentation

## 2016-11-22 DIAGNOSIS — D638 Anemia in other chronic diseases classified elsewhere: Secondary | ICD-10-CM

## 2016-11-22 LAB — CBC WITH DIFFERENTIAL/PLATELET
BASOS ABS: 0 10*3/uL (ref 0–0.1)
BASOS PCT: 1 %
EOS PCT: 4 %
Eosinophils Absolute: 0.2 10*3/uL (ref 0–0.7)
HCT: 28.8 % — ABNORMAL LOW (ref 40.0–52.0)
Hemoglobin: 9.6 g/dL — ABNORMAL LOW (ref 13.0–18.0)
LYMPHS PCT: 35 %
Lymphs Abs: 2.1 10*3/uL (ref 1.0–3.6)
MCH: 29.9 pg (ref 26.0–34.0)
MCHC: 33.4 g/dL (ref 32.0–36.0)
MCV: 89.5 fL (ref 80.0–100.0)
MONO ABS: 0.6 10*3/uL (ref 0.2–1.0)
Monocytes Relative: 10 %
Neutro Abs: 3 10*3/uL (ref 1.4–6.5)
Neutrophils Relative %: 50 %
PLATELETS: 201 10*3/uL (ref 150–440)
RBC: 3.22 MIL/uL — ABNORMAL LOW (ref 4.40–5.90)
RDW: 13.1 % (ref 11.5–14.5)
WBC: 6.1 10*3/uL (ref 3.8–10.6)

## 2016-11-22 LAB — COMPREHENSIVE METABOLIC PANEL
ALT: 22 U/L (ref 17–63)
AST: 19 U/L (ref 15–41)
Albumin: 3.8 g/dL (ref 3.5–5.0)
Alkaline Phosphatase: 80 U/L (ref 38–126)
Anion gap: 8 (ref 5–15)
BUN: 36 mg/dL — AB (ref 6–20)
CHLORIDE: 102 mmol/L (ref 101–111)
CO2: 27 mmol/L (ref 22–32)
Calcium: 9.5 mg/dL (ref 8.9–10.3)
Creatinine, Ser: 1.95 mg/dL — ABNORMAL HIGH (ref 0.61–1.24)
GFR calc Af Amer: 38 mL/min — ABNORMAL LOW (ref >60)
GFR, EST NON AFRICAN AMERICAN: 32 mL/min — AB (ref >60)
Glucose, Bld: 156 mg/dL — ABNORMAL HIGH (ref 65–99)
POTASSIUM: 4.6 mmol/L (ref 3.5–5.1)
Sodium: 137 mmol/L (ref 135–145)
Total Bilirubin: 0.6 mg/dL (ref 0.3–1.2)
Total Protein: 7.1 g/dL (ref 6.5–8.1)

## 2016-11-22 LAB — BRAIN NATRIURETIC PEPTIDE: B NATRIURETIC PEPTIDE 5: 234 pg/mL — AB (ref 0.0–100.0)

## 2016-11-23 LAB — KAPPA/LAMBDA LIGHT CHAINS
Kappa free light chain: 23.9 mg/L — ABNORMAL HIGH (ref 3.3–19.4)
Kappa, lambda light chain ratio: 0.02 — ABNORMAL LOW (ref 0.26–1.65)
Lambda free light chains: 1370.1 mg/L — ABNORMAL HIGH (ref 5.7–26.3)

## 2016-11-24 DIAGNOSIS — G8929 Other chronic pain: Secondary | ICD-10-CM | POA: Diagnosis not present

## 2016-11-24 DIAGNOSIS — C9000 Multiple myeloma not having achieved remission: Secondary | ICD-10-CM | POA: Diagnosis not present

## 2016-11-24 DIAGNOSIS — M545 Low back pain: Secondary | ICD-10-CM | POA: Diagnosis not present

## 2016-11-24 DIAGNOSIS — E119 Type 2 diabetes mellitus without complications: Secondary | ICD-10-CM | POA: Diagnosis not present

## 2016-11-24 DIAGNOSIS — I129 Hypertensive chronic kidney disease with stage 1 through stage 4 chronic kidney disease, or unspecified chronic kidney disease: Secondary | ICD-10-CM | POA: Diagnosis not present

## 2016-11-24 DIAGNOSIS — N183 Chronic kidney disease, stage 3 (moderate): Secondary | ICD-10-CM | POA: Diagnosis not present

## 2016-11-24 DIAGNOSIS — K219 Gastro-esophageal reflux disease without esophagitis: Secondary | ICD-10-CM | POA: Diagnosis not present

## 2016-11-24 DIAGNOSIS — N2581 Secondary hyperparathyroidism of renal origin: Secondary | ICD-10-CM | POA: Diagnosis not present

## 2016-11-24 DIAGNOSIS — I509 Heart failure, unspecified: Secondary | ICD-10-CM | POA: Diagnosis not present

## 2016-11-24 DIAGNOSIS — D631 Anemia in chronic kidney disease: Secondary | ICD-10-CM | POA: Diagnosis not present

## 2016-11-24 LAB — MULTIPLE MYELOMA PANEL, SERUM
ALPHA2 GLOB SERPL ELPH-MCNC: 1.1 g/dL — AB (ref 0.4–1.0)
Albumin SerPl Elph-Mcnc: 3.3 g/dL (ref 2.9–4.4)
Albumin/Glob SerPl: 1.1 (ref 0.7–1.7)
Alpha 1: 0.2 g/dL (ref 0.0–0.4)
B-Globulin SerPl Elph-Mcnc: 0.9 g/dL (ref 0.7–1.3)
Gamma Glob SerPl Elph-Mcnc: 1 g/dL (ref 0.4–1.8)
Globulin, Total: 3.2 g/dL (ref 2.2–3.9)
IGA: 128 mg/dL (ref 61–437)
IGG (IMMUNOGLOBIN G), SERUM: 1025 mg/dL (ref 700–1600)
IGM (IMMUNOGLOBULIN M), SRM: 37 mg/dL (ref 15–143)
TOTAL PROTEIN ELP: 6.5 g/dL (ref 6.0–8.5)

## 2016-11-25 ENCOUNTER — Other Ambulatory Visit: Payer: Self-pay

## 2016-11-25 DIAGNOSIS — D8989 Other specified disorders involving the immune mechanism, not elsewhere classified: Secondary | ICD-10-CM

## 2016-11-25 DIAGNOSIS — D638 Anemia in other chronic diseases classified elsewhere: Secondary | ICD-10-CM

## 2016-11-28 LAB — UPEP/TP, 24-HR URINE
ALPHA 2, URINE: 5 %
Albumin, U: 42.3 %
Alpha 1, Urine: 1.5 %
BETA UR: 30.2 %
GAMMA UR: 20.9 %
M-SPIKE, MG/24 HR U24PEL: 66 mg/(24.h) — AB
M-spike, %: 11.5 % — ABNORMAL HIGH
TOTAL PROTEIN, URINE-UPE24: 34.8 mg/dL
Total Protein, Urine-Ur/day: 574 mg/24 hr — ABNORMAL HIGH (ref 30–150)
Total Volume: 1650

## 2016-11-29 ENCOUNTER — Inpatient Hospital Stay (HOSPITAL_BASED_OUTPATIENT_CLINIC_OR_DEPARTMENT_OTHER): Payer: Medicare HMO | Admitting: Oncology

## 2016-11-29 VITALS — BP 118/56 | HR 73 | Temp 95.5°F | Resp 18 | Wt 190.3 lb

## 2016-11-29 DIAGNOSIS — D631 Anemia in chronic kidney disease: Secondary | ICD-10-CM

## 2016-11-29 DIAGNOSIS — Z86718 Personal history of other venous thrombosis and embolism: Secondary | ICD-10-CM

## 2016-11-29 DIAGNOSIS — G8929 Other chronic pain: Secondary | ICD-10-CM

## 2016-11-29 DIAGNOSIS — N183 Chronic kidney disease, stage 3 unspecified: Secondary | ICD-10-CM

## 2016-11-29 DIAGNOSIS — E7800 Pure hypercholesterolemia, unspecified: Secondary | ICD-10-CM

## 2016-11-29 DIAGNOSIS — N2581 Secondary hyperparathyroidism of renal origin: Secondary | ICD-10-CM

## 2016-11-29 DIAGNOSIS — Z8546 Personal history of malignant neoplasm of prostate: Secondary | ICD-10-CM

## 2016-11-29 DIAGNOSIS — C9000 Multiple myeloma not having achieved remission: Secondary | ICD-10-CM | POA: Diagnosis not present

## 2016-11-29 DIAGNOSIS — D8989 Other specified disorders involving the immune mechanism, not elsewhere classified: Secondary | ICD-10-CM

## 2016-11-29 DIAGNOSIS — I129 Hypertensive chronic kidney disease with stage 1 through stage 4 chronic kidney disease, or unspecified chronic kidney disease: Secondary | ICD-10-CM | POA: Diagnosis not present

## 2016-11-29 DIAGNOSIS — E119 Type 2 diabetes mellitus without complications: Secondary | ICD-10-CM | POA: Diagnosis not present

## 2016-11-29 DIAGNOSIS — M545 Low back pain: Secondary | ICD-10-CM | POA: Diagnosis not present

## 2016-11-29 DIAGNOSIS — Z809 Family history of malignant neoplasm, unspecified: Secondary | ICD-10-CM

## 2016-11-29 DIAGNOSIS — Z79899 Other long term (current) drug therapy: Secondary | ICD-10-CM

## 2016-11-29 DIAGNOSIS — Z794 Long term (current) use of insulin: Secondary | ICD-10-CM

## 2016-11-29 DIAGNOSIS — K219 Gastro-esophageal reflux disease without esophagitis: Secondary | ICD-10-CM | POA: Diagnosis not present

## 2016-11-29 DIAGNOSIS — I509 Heart failure, unspecified: Secondary | ICD-10-CM

## 2016-11-29 DIAGNOSIS — Z7982 Long term (current) use of aspirin: Secondary | ICD-10-CM

## 2016-11-29 NOTE — Progress Notes (Signed)
Hematology/Oncology Consult note [facility]  Telephone:([phone] [phone]  Patient Care Team: Cletis Athens, MD as PCP - General (Internal Medicine)   Name of the patient: Jesse Dickson  [id number]  11-24-1940   Date of visit: 11/29/16  Diagnosis-  1. Anemia of chronic kidney disease  2. 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 Holley Raring for chronic kidney disease. He has type 2 diabetes, anemia of chronic kidney disease and stage III CKD along with hypertension and secondary hyperparathyroidism. In January 2018 hishemoglobin was10.7. Iron saturation 20% serum creatinine was elevated at 1.64 CBC in 08/05/2016 showed white count of 6.4, H&H of 9.1/28.6 with an MCV of 91 and a platelet count of 210. Serum creatinine elevated at 1.4. Patient also noted to have M spike in his urine of13.2%  Results of bloodwork from 09/13/2016 were as follows: CBC showed white count of 6.7, H&H of 9.6/28.5 and a platelet count of 238. CMP was significant for elevated BUN of 34 and creatinine of 1.99. Blood sugar was elevated at 154. TSH was within normal limits. B12 folate and iron studies were within normal limits. Ferritin was high normal at 371. Haptoglobin was elevated at 432. Multiple myeloma panel revealed a monoclonal free lambda light chains. Quantitative immunoglobulins are within normal limits. Serum free lambda Light chain ratio was elevated at 50. Random urine protein electrophoresis and immunofixation revealed Bence Jones protein positive lambda type. M spike of 14.6% noted total protein in the random sample was 110 mg/dL  Bone marrow biopsy showed slightly increased number of plasma cells about 6% with lack of large aggregate partially. Immunohistochemical stain showed plasma cells are lambda light chain restricted consistent with a  plasma cell neoplasm. No molecular cytogenetic abnormalities were noted. 24-hour urine protein revealed 574 mg of protein and 91 mg of M spike. Immunofixation in the urine showed Bence Jones protein positive lambda type  Bone survey did not show any lytic lesions  Interval history- continues to have chronic low back pain that has remaiend unchanged. eh does ot wish to see multiple doctors  ECOG PS- 2 Pain scale- 5   Review of systems- Review of Systems  Constitutional: Positive for malaise/fatigue. Negative for chills, fever and weight loss.  HENT: Negative for congestion, ear discharge and nosebleeds.   Eyes: Negative for blurred vision.  Respiratory: Negative for cough, hemoptysis, sputum production, shortness of breath and wheezing.   Cardiovascular: Negative for chest pain, palpitations, orthopnea and claudication.  Gastrointestinal: Negative for abdominal pain, blood in stool, constipation, diarrhea, heartburn, melena, nausea and vomiting.  Genitourinary: Negative for dysuria, flank pain, frequency, hematuria and urgency.  Musculoskeletal: Positive for back pain. Negative for joint pain and myalgias.  Skin: Negative for rash.  Neurological: Negative for dizziness, tingling, focal weakness, seizures, weakness and headaches.  Endo/Heme/Allergies: Does not bruise/bleed easily.  Psychiatric/Behavioral: Negative for depression and suicidal ideas. The patient does not have insomnia.       No Known Allergies   Past Medical History:  Diagnosis Date  . CHF (congestive heart failure) ([place])   . Diabetes mellitus without complication ([place])   . DVT (deep venous thrombosis) ([place])   . GERD (gastroesophageal reflux disease)   . Hypercholesteremia   . Hypertension   . Prostate CA ([place]) 2015   only surgery per pt     Past Surgical History:  Procedure  Laterality Date  . PROSTATECTOMY      Social History   Social History  . Marital status: Married    Spouse name: N/A  . Number of  children: N/A  . Years of education: N/A   Occupational History  . Not on file.   Social History Main Topics  . Smoking status: Never Smoker  . Smokeless tobacco: Never Used  . Alcohol use Yes  . Drug use: No  . Sexual activity: No   Other Topics Concern  . Not on file   Social History Narrative  . No narrative on file    Family History  Problem Relation Age of Onset  . Diabetes Mother   . COPD Mother   . Cancer Sister   . Cancer Sister   . Dementia Sister      Current Outpatient Prescriptions:  .  aspirin EC 81 MG tablet, Take by mouth., Disp: , Rfl:  .  atorvastatin (LIPITOR) 20 MG tablet, Take 20 mg by mouth daily., Disp: , Rfl:  .  calcitRIOL (ROCALTROL) 0.25 MCG capsule, Take 0.25 mcg by mouth daily., Disp: , Rfl: 6 .  carvedilol (COREG) 25 MG tablet, Take 25 mg by mouth 2 (two) times daily., Disp: , Rfl:  .  docusate sodium (COLACE) 100 MG capsule, Take 100 mg by mouth 2 (two) times daily., Disp: , Rfl:  .  famotidine (PEPCID) 20 MG tablet, Take by mouth., Disp: , Rfl:  .  furosemide (LASIX) 40 MG tablet, Take 40 mg by mouth 2 (two) times daily., Disp: , Rfl:  .  gabapentin (NEURONTIN) 100 MG capsule, Take 200 mg by mouth daily., Disp: , Rfl: 2 .  insulin glargine (LANTUS) 100 UNIT/ML injection, Inject 25 Units into the skin at bedtime. , Disp: , Rfl:  .  Insulin Syringe-Needle U-100 (INSULIN SYRINGE 1CC/30GX5/16") 30G X 5/16" 1 ML MISC, Use 1 Dose 4 (four) times daily., Disp: , Rfl:  .  tamsulosin (FLOMAX) 0.4 MG CAPS capsule, Take 0.4 mg by mouth at bedtime., Disp: , Rfl:  .  traMADol (ULTRAM) 50 MG tablet, Take 1 tablet (50 mg total) by mouth every 6 (six) hours as needed., Disp: 20 tablet, Rfl: 0 .  polyethylene glycol (MIRALAX / GLYCOLAX) packet, Take by mouth., Disp: , Rfl:   Physical exam:  Vitals:   11/29/16 1148  BP: (!) 118/56  Pulse: 73  Resp: 18  Temp: (!) 95.5 F (35.3 C)  TempSrc: Tympanic  Weight: 190 lb 4.8 oz (86.3 kg)   Physical Exam    Constitutional: He is oriented to person, place, and time and well-developed, well-nourished, and in no distress.  HENT:  Head: Normocephalic and atraumatic.  Eyes: Pupils are equal, round, and reactive to light. EOM are normal.  Neck: Normal range of motion.  Cardiovascular: Normal rate, regular rhythm and normal heart sounds.   Pulmonary/Chest: Effort normal and breath sounds normal.  Abdominal: Soft. Bowel sounds are normal.  Neurological: He is alert and oriented to person, place, and time.  Skin: Skin is warm and dry.     CMP Latest Ref Rng & Units 11/22/2016  Glucose 65 - 99 mg/dL 156(H)  BUN 6 - 20 mg/dL 36(H)  Creatinine 0.61 - 1.24 mg/dL 1.95(H)  Sodium 135 - 145 mmol/L 137  Potassium 3.5 - 5.1 mmol/L 4.6  Chloride 101 - 111 mmol/L 102  CO2 22 - 32 mmol/L 27  Calcium 8.9 - 10.3 mg/dL 9.5  Total Protein 6.5 - 8.1 g/dL 7.1  Total Bilirubin 0.3 - 1.2 mg/dL 0.6  Alkaline Phos 38 - 126 U/L 80  AST 15 - 41 U/L 19  ALT 17 - 63 U/L 22   CBC Latest Ref Rng & Units 11/22/2016  WBC 3.8 - 10.6 K/uL 6.1  Hemoglobin 13.0 - 18.0 g/dL 9.6(L)  Hematocrit 40.0 - 52.0 % 28.8(L)  Platelets 150 - 440 K/uL 201    No images are attached to the encounter.  Dg Bone Survey Met  Result Date: 11/01/2016 CLINICAL DATA:  Pelvis and lower extremities. EXAM: METASTATIC BONE SURVEY COMPARISON:  None in PACs FINDINGS: Chest x-ray: The right lung is well-expanded. On the left the hemidiaphragm is elevated. The heart is top-normal in size. The pulmonary vascularity is normal. There is no pleural effusion. No lytic or blastic bony lesions are observed. Calvarium and spine: No lytic or blastic lesions are observed. There are degenerative changes in the mid cervical and lower lumbar spine. Upper extremities: There are soft tissue calcifications associated with the right shoulder. No lytic nor blastic bony lesions of the upper extremities are observed. Pelvis and lower extremities no lytic or blastic bony  lesions are observed. IMPRESSION: There are no bony changes suspicious for myeloma. Electronically Signed   By: David  Martinique M.D.   On: 11/01/2016 14:12     Assessment and plan- Patient is a 76 y.o. male with possible light chain disease  Overall his hb is stable around 11. Creatinine stable around 1.9. He does have elevated free lambda around 1700 which has remained stable and lambda kappa ratio stable at 50. He still does not meet criteria for multiple myemloma. He only had 9% plasma cells in his bone marrow. I would recommend repeating free light chains, cbc, cmp and myeloma panel in 3 and 6 months and I will see him in 6 months  I also spoke to DR. lateef that if he does not wish to see multiple doctors, he should all these labs monitored by them Q3-6 months and if there is any concern for worsening free light chain ratio, anemia of kidney functions- repeat bone marrow biopsy or kidney biopsy can be considered at that time    Visit Diagnosis 1. CKD (chronic kidney disease) stage 3, GFR 30-59 ml/min   2. 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 Randa Evens, MD, MPH [facility] Pager- [phone] 11/29/2016 2:45 PM

## 2016-11-29 NOTE — Progress Notes (Signed)
Here for follow up

## 2016-11-30 ENCOUNTER — Encounter: Payer: Self-pay | Admitting: Student in an Organized Health Care Education/Training Program

## 2016-11-30 ENCOUNTER — Ambulatory Visit
Payer: Medicare HMO | Attending: Student in an Organized Health Care Education/Training Program | Admitting: Student in an Organized Health Care Education/Training Program

## 2016-11-30 VITALS — BP 132/109 | HR 70 | Temp 98.1°F | Resp 18 | Ht 68.0 in | Wt 190.0 lb

## 2016-11-30 DIAGNOSIS — Z7982 Long term (current) use of aspirin: Secondary | ICD-10-CM | POA: Diagnosis not present

## 2016-11-30 DIAGNOSIS — M48061 Spinal stenosis, lumbar region without neurogenic claudication: Secondary | ICD-10-CM

## 2016-11-30 DIAGNOSIS — Z8546 Personal history of malignant neoplasm of prostate: Secondary | ICD-10-CM | POA: Insufficient documentation

## 2016-11-30 DIAGNOSIS — I5032 Chronic diastolic (congestive) heart failure: Secondary | ICD-10-CM | POA: Diagnosis not present

## 2016-11-30 DIAGNOSIS — Z86718 Personal history of other venous thrombosis and embolism: Secondary | ICD-10-CM | POA: Diagnosis not present

## 2016-11-30 DIAGNOSIS — M5416 Radiculopathy, lumbar region: Secondary | ICD-10-CM

## 2016-11-30 DIAGNOSIS — N183 Chronic kidney disease, stage 3 unspecified: Secondary | ICD-10-CM

## 2016-11-30 DIAGNOSIS — K219 Gastro-esophageal reflux disease without esophagitis: Secondary | ICD-10-CM | POA: Insufficient documentation

## 2016-11-30 DIAGNOSIS — M9983 Other biomechanical lesions of lumbar region: Secondary | ICD-10-CM | POA: Diagnosis not present

## 2016-11-30 DIAGNOSIS — I130 Hypertensive heart and chronic kidney disease with heart failure and stage 1 through stage 4 chronic kidney disease, or unspecified chronic kidney disease: Secondary | ICD-10-CM | POA: Insufficient documentation

## 2016-11-30 DIAGNOSIS — E1122 Type 2 diabetes mellitus with diabetic chronic kidney disease: Secondary | ICD-10-CM | POA: Diagnosis not present

## 2016-11-30 DIAGNOSIS — Z825 Family history of asthma and other chronic lower respiratory diseases: Secondary | ICD-10-CM | POA: Insufficient documentation

## 2016-11-30 DIAGNOSIS — E7800 Pure hypercholesterolemia, unspecified: Secondary | ICD-10-CM | POA: Diagnosis not present

## 2016-11-30 DIAGNOSIS — G894 Chronic pain syndrome: Secondary | ICD-10-CM | POA: Insufficient documentation

## 2016-11-30 DIAGNOSIS — M48062 Spinal stenosis, lumbar region with neurogenic claudication: Secondary | ICD-10-CM | POA: Insufficient documentation

## 2016-11-30 DIAGNOSIS — Z833 Family history of diabetes mellitus: Secondary | ICD-10-CM | POA: Insufficient documentation

## 2016-11-30 DIAGNOSIS — Z79899 Other long term (current) drug therapy: Secondary | ICD-10-CM | POA: Diagnosis not present

## 2016-11-30 DIAGNOSIS — Z794 Long term (current) use of insulin: Secondary | ICD-10-CM | POA: Diagnosis not present

## 2016-11-30 DIAGNOSIS — M5136 Other intervertebral disc degeneration, lumbar region: Secondary | ICD-10-CM | POA: Diagnosis not present

## 2016-11-30 DIAGNOSIS — M5116 Intervertebral disc disorders with radiculopathy, lumbar region: Secondary | ICD-10-CM | POA: Diagnosis not present

## 2016-11-30 DIAGNOSIS — Z9079 Acquired absence of other genital organ(s): Secondary | ICD-10-CM | POA: Insufficient documentation

## 2016-11-30 DIAGNOSIS — D631 Anemia in chronic kidney disease: Secondary | ICD-10-CM | POA: Diagnosis not present

## 2016-11-30 DIAGNOSIS — M545 Low back pain: Secondary | ICD-10-CM | POA: Diagnosis present

## 2016-11-30 NOTE — Progress Notes (Signed)
Patient's Name: Jesse Dickson  MRN: [id number]  Referring Provider: Anthonette Legato, MD  DOB: [birth year]  PCP: Jesse [place], MD  DOS: 11/30/2016  Note by: Gillis Santa, MD  Service setting: Ambulatory outpatient  Specialty: Interventional Pain Management  Location: [facility] (AMB) Pain Management Facility  Visit type: Initial Patient Evaluation  Patient type: New Patient   Primary Reason(s) for Visit: Encounter for initial evaluation of one or more chronic problems (new to examiner) potentially causing chronic pain, and posing a threat to normal musculoskeletal function. (Level of risk: High) CC: Back Pain (low) and Leg Pain (left)  HPI  Jesse Dickson is a 76 y.o. year old, male patient, who comes today to see [place] for the first time for an initial evaluation of his chronic pain. He has Anemia of chronic disease; Chronic diastolic congestive heart failure ([place]); Chronic low back pain; CKD (chronic kidney disease) stage 3, GFR 30-59 ml/min; GERD without esophagitis; Insulin dependent diabetes mellitus ([place]); and Pure hypercholesterolemia on his problem list. Today he comes in for evaluation of his Back Pain (low) and Leg Pain (left)  Pain Assessment: Location: Lower Back Radiating: left leg Onset: More than a month ago Duration: Chronic pain Quality: Aching, Stabbing, Shooting Severity: 8 /10 (self-reported pain score)  Note: Reported level is compatible with observation.                   When using our objective Pain Scale, levels between 6 and 10/10 are said to belong in an emergency room, as it progressively worsens from a 6/10, described as severely limiting, requiring emergency care not usually available at an outpatient pain management facility. At a 6/10 level, communication becomes difficult and requires great effort. Assistance to reach the emergency department may be required. Facial flushing and profuse sweating along with potentially dangerous increases in heart rate and blood pressure  will be evident. Effect on ADL:   Timing: Intermittent Modifying factors: sitting  Onset and Duration: Gradual Cause of pain: Arthritis Severity: Getting worse Timing: Not influenced by the time of the day Aggravating Factors: Bending, Kneeling, Lifiting, Motion, Walking, Walking uphill and Walking downhill Alleviating Factors: Resting and Sitting Associated Problems: Constipation and Tingling Quality of Pain: Agonizing, Annoying, Cramping, Disabling, Horrible, Sharp, Shooting, Stabbing and Superficial Previous Examinations or Tests: Bone scan, MRI scan and X-rays Previous Treatments: Epidural steroid injections  The patient comes into the clinics today for the first time for a chronic pain management evaluation.   76 year old male with a past medical history of CHF, type 2 diabetes on insulin, history of DVT, GERD, hypertension, history of prostate cancer status post prostatectomy, stage III, chronic kidney disease who presents with low back pain that radiates into bilateral legs. Patient states that his pain is gone worse over time. This pain is exacerbated by movement specifically ambulating and extending back. He states that when he is at the grocery store he has to lean forward over the shopping cart to walk around. No obvious weakness noted also states that left leg gets weak with prolonged ambulation.No obvious bowel or bladder issues. Patient does endorse chronic constipation.  He has tried Tramadol which isn't very effective. Patient also has had transforaminal epidural steroid injections done over a year ago which were not very helpful  Today I took the time to provide the patient with information regarding my pain practice. The patient was informed that my practice is divided into two sections: an interventional pain management section, as well as a completely separate and  distinct medication management section. I explained that I have procedure days for my interventional therapies,  and evaluation days for follow-ups and medication management. Because of the amount of documentation required during both, they are kept separated. This means that there is the possibility that he may be scheduled for a procedure on one day, and medication management the next. I have also informed him that because of staffing and facility limitations, I no longer take patients for medication management only. To illustrate the reasons for this, I gave the patient the example of surgeons, and how inappropriate it would be to refer a patient to his/her care, just to write for the post-surgical antibiotics on a surgery done by a different surgeon.   Because interventional pain management is my board-certified specialty, the patient was informed that joining my practice means that they are open to any and all interventional therapies. I made it clear that this does not mean that they will be forced to have any procedures done. What this means is that I believe interventional therapies to be essential part of the diagnosis and proper management of chronic pain conditions. Therefore, patients not interested in these interventional alternatives will be better served under the care of a different practitioner.  The patient was also made aware of my Comprehensive Pain Management Safety Guidelines where by joining my practice, they limit all of their nerve blocks and joint injections to those done by our practice, for as long as we are retained to manage their care.   Historic Controlled Substance Pharmacotherapy Review  PMP and historical list of controlled substances: tramadol 50 mg, quantity 90, last fill 10/20/2016. MME/day: 15 mg/day Medications: The patient did not bring the medication(s) to the appointment, as requested in our "New Patient Package" Pharmacodynamics: Desired effects: Analgesia: The patient reports >50% benefit. Reported improvement in function: The patient reports medication allows him to  accomplish basic ADLs. Clinically meaningful improvement in function (CMIF): Sustained CMIF goals met Perceived effectiveness: Described as relatively effective, allowing for increase in activities of daily living (ADL) Undesirable effects: Side-effects or Adverse reactions: None reported Historical Monitoring: The patient  reports that he does not use drugs. List of all UDS Test(s): No results found for: MDMA, COCAINSCRNUR, PCPSCRNUR, PCPQUANT, CANNABQUANT, THCU, [place] List of all Serum Drug Screening Test(s):  No results found for: AMPHSCRSER, BARBSCRSER, BENZOSCRSER, COCAINSCRSER, PCPSCRSER, PCPQUANT, THCSCRSER, CANNABQUANT, OPIATESCRSER, OXYSCRSER, PROPOXSCRSER Historical Background Evaluation: [place] PMP: Six (6) year initial data search conducted.             PMP NARX Score Report:  Narcotic:280 Sedative: 120 Stimulant: 0 [place] Department of public safety, offender search: Editor, commissioning Information) Non-contributory Risk Assessment Profile: Aberrant behavior: None observed or detected today Risk factors for fatal opioid overdose: None identified today PMP NARX Overdose Risk Score: 230 Fatal overdose hazard ratio (HR): Calculation deferred Non-fatal overdose hazard ratio (HR): Calculation deferred Risk of opioid abuse or dependence: 0.7-3.0% with doses ? 36 MME/day and 6.1-26% with doses ? 120 MME/day. Substance use disorder (SUD) risk level: Low Opioid risk tool (ORT) (Total Score): 0     Opioid Risk Tool - 11/30/16 1419      Family History of Substance Abuse   Alcohol Negative   Illegal Drugs Negative   Rx Drugs Negative     Personal History of Substance Abuse   Alcohol Negative   Illegal Drugs Negative   Rx Drugs Negative     Age   Age between 43-45 years  No  History of Preadolescent Sexual Abuse   History of Preadolescent Sexual Abuse Negative or Male     Psychological Disease   Psychological Disease Negative   Depression Negative     Total Score   Opioid Risk Tool  Scoring 0   Opioid Risk Interpretation Low Risk     ORT Scoring interpretation table:  Score <3 = Low Risk for SUD  Score between 4-7 = Moderate Risk for SUD  Score >8 = High Risk for Opioid Abuse     Pharmacologic Plan: Pending ordered tests and/or consults            Initial impression: Pending review of available data and ordered tests.  Meds   Current Outpatient Prescriptions:  .  aspirin EC 81 MG tablet, Take by mouth., Disp: , Rfl:  .  atorvastatin (LIPITOR) 20 MG tablet, Take 20 mg by mouth daily., Disp: , Rfl:  .  calcitRIOL (ROCALTROL) 0.25 MCG capsule, Take 0.25 mcg by mouth daily., Disp: , Rfl: 6 .  carvedilol (COREG) 25 MG tablet, Take 25 mg by mouth 2 (two) times daily., Disp: , Rfl:  .  docusate sodium (COLACE) 100 MG capsule, Take 100 mg by mouth 2 (two) times daily., Disp: , Rfl:  .  famotidine (PEPCID) 20 MG tablet, Take by mouth., Disp: , Rfl:  .  furosemide (LASIX) 40 MG tablet, Take 40 mg by mouth 2 (two) times daily., Disp: , Rfl:  .  gabapentin (NEURONTIN) 100 MG capsule, Take 200 mg by mouth daily., Disp: , Rfl: 2 .  insulin glargine (LANTUS) 100 UNIT/ML injection, Inject 25 Units into the skin at bedtime. , Disp: , Rfl:  .  Insulin Syringe-Needle U-100 (INSULIN SYRINGE 1CC/30GX5/16") 30G X 5/16" 1 ML MISC, Use 1 Dose 4 (four) times daily., Disp: , Rfl:  .  polyethylene glycol (MIRALAX / GLYCOLAX) packet, Take by mouth., Disp: , Rfl:  .  tamsulosin (FLOMAX) 0.4 MG CAPS capsule, Take 0.4 mg by mouth at bedtime., Disp: , Rfl:  .  traMADol (ULTRAM) 50 MG tablet, Take 1 tablet (50 mg total) by mouth every 6 (six) hours as needed., Disp: 20 tablet, Rfl: 0  Imaging Review    Lumbosacral Imaging: Lumbar MR wo contrast:  Results for orders placed during the hospital encounter of 06/02/15  MR Lumbar Spine Wo Contrast   Narrative CLINICAL DATA:  Low back and left buttock pain radiating down the left leg and into the foot for 3-4 years. No known injury.  Initial encounter.  EXAM: MRI LUMBAR SPINE WITHOUT CONTRAST  TECHNIQUE: Multiplanar, multisequence MR imaging of the lumbar spine was performed. No intravenous contrast was administered.  COMPARISON:  Plain films lumbar spine 02/18/2015.  FINDINGS: Vertebral body height, signal alignment are maintained. The conus medullaris is normal in signal and position. Imaged intra-abdominal contents are unremarkable.  T11-12 is imaged in the sagittal plane only and negative.  T12-L1:  Negative.  L1-2:  Negative.  L2-3:  Negative.  L3-4: Shallow broad-based disc bulge causes mild to moderate central canal narrowing. Mild to moderate bilateral foraminal narrowing is also identified and appears worse on the left.  L4-5: Shallow broad-based disc bulge without central canal or foraminal stenosis.  L5-S1:  Negative.  IMPRESSION: Disc bulge at L3-4 causes mild to moderate central canal and bilateral foraminal narrowing. Foraminal narrowing appears somewhat worse on the left.  Shallow disc bulge L4-5 without central canal or foraminal stenosis.   Electronically Signed   By: Inge Rise M.D.  On: 06/02/2015 09:56     Lumbar DG 2-3 views:  Results for orders placed during the hospital encounter of 02/18/15  DG Lumbar Spine 2-3 Views   Narrative CLINICAL DATA:  76 year old male with left side lumbar back pain for 3 weeks radiating to the left lower extremity with no known injury. Initial encounter.  EXAM: LUMBAR SPINE - 2-3 VIEW  COMPARISON:  None.  FINDINGS: Transitional lumbosacral anatomy. For the purposes of this report, the lowest full size ribs are designated at T11, with hypoplastic or absent ribs at T12, and partially sacralized L5 level. Correlation with radiographs is recommended prior to any operative intervention.  L5-S1 disc space loss, endplate spurring, and bilateral assimilation joints. Endplate spurring but preserved disc spaces elsewhere in  the lumbar spine. SI joints within normal limits. Spina bifida occulta at L5.  Extensive calcified iliac artery atherosclerosis.  IMPRESSION: 1. Transitional lumbosacral anatomy. Correlation with radiographs is recommended prior to any operative intervention. 2. No acute osseous abnormality identified in the lumbar spine. Lumbar endplate degeneration. Bilateral assimilation joint sclerosis at L5-S1.   Electronically Signed   By: Genevie Ann M.D.   On: 02/18/2015 12:53      Complexity Note: Imaging results reviewed. Results discussed using Layman's terms.               ROS  Cardiovascular History: Heart trouble, Heart failure and Weak heart (CHF) Pulmonary or Respiratory History: Shortness of breath Neurological History: No reported neurological signs or symptoms such as seizures, abnormal skin sensations, urinary and/or fecal incontinence, being born with an abnormal open spine and/or a tethered spinal cord Review of Past Neurological Studies: No results found for this or any previous visit. Psychological-Psychiatric History: No reported psychological or psychiatric signs or symptoms such as difficulty sleeping, anxiety, depression, delusions or hallucinations (schizophrenial), mood swings (bipolar disorders) or suicidal ideations or attempts Gastrointestinal History: Irregular, infrequent bowel movements (Constipation) Genitourinary History: Kidney disease Hematological History: Coagulation disorder Endocrine History: No reported endocrine signs or symptoms such as high or low blood sugar, rapid heart rate due to high thyroid levels, obesity or weight gain due to slow thyroid or thyroid disease Rheumatologic History: Joint aches and or swelling due to excess weight (Osteoarthritis) Musculoskeletal History: Negative for myasthenia gravis, muscular dystrophy, multiple sclerosis or malignant hyperthermia Work History: Retired  Allergies  Mr. Nicolson has No Known  Allergies.  Laboratory Chemistry  Inflammation Markers (CRP: Acute Phase) (ESR: Chronic Phase) No results found for: CRP, ESRSEDRATE               Renal Function Markers Lab Results  Component Value Date   BUN 36 (H) 11/22/2016   CREATININE 1.95 (H) 11/22/2016   GFRAA 38 (L) 11/22/2016   GFRNONAA 32 (L) 11/22/2016                 Hepatic Function Markers Lab Results  Component Value Date   AST 19 11/22/2016   ALT 22 11/22/2016   ALBUMIN 3.8 11/22/2016   ALKPHOS 80 11/22/2016                 Electrolytes Lab Results  Component Value Date   NA 137 11/22/2016   K 4.6 11/22/2016   CL 102 11/22/2016   CALCIUM 9.5 11/22/2016                 Neuropathy Markers Lab Results  Component Value Date   VITAMINB12 570 09/13/2016  Bone Pathology Markers Lab Results  Component Value Date   ALKPHOS 80 11/22/2016   CALCIUM 9.5 11/22/2016                 Coagulation Parameters Lab Results  Component Value Date   INR 1.11 10/11/2016   LABPROT 14.4 10/11/2016   APTT 31 10/11/2016   PLT 201 11/22/2016                 Cardiovascular Markers Lab Results  Component Value Date   BNP 234.0 (H) 11/22/2016   HGB 9.6 (L) 11/22/2016   HCT 28.8 (L) 11/22/2016                 Note: Lab results reviewed.  [place]  Drug: Mr. Dahms  reports that he does not use drugs. Alcohol:  reports that he drinks alcohol. Tobacco:  reports that he has never smoked. He has never used smokeless tobacco. Medical:  has a past medical history of CHF (congestive heart failure) ([place]); Diabetes mellitus without complication ([place]); DVT (deep venous thrombosis) ([place]); GERD (gastroesophageal reflux disease); Hypercholesteremia; Hypertension; and Prostate CA ([place]) (2015). Family: family history includes COPD in his mother; Cancer in his sister and sister; Dementia in his sister; Diabetes in his mother.  Past Surgical History:  Procedure Laterality Date  . PROSTATECTOMY     Active  Ambulatory Problems    Diagnosis Date Noted  . Anemia of chronic disease 05/18/2015  . Chronic diastolic congestive heart failure ([place]) 09/13/2016  . Chronic low back pain 09/13/2016  . CKD (chronic kidney disease) stage 3, GFR 30-59 ml/min 05/18/2015  . GERD without esophagitis 09/13/2016  . Insulin dependent diabetes mellitus ([place]) 09/13/2016  . Pure hypercholesterolemia 09/13/2016   Resolved Ambulatory Problems    Diagnosis Date Noted  . No Resolved Ambulatory Problems   Past Medical History:  Diagnosis Date  . CHF (congestive heart failure) ([place])   . Diabetes mellitus without complication ([place])   . DVT (deep venous thrombosis) ([place])   . GERD (gastroesophageal reflux disease)   . Hypercholesteremia   . Hypertension   . Prostate CA ([place]) 2015   Constitutional Exam  General appearance: Well nourished, well developed, and well hydrated. In no apparent acute distress Vitals:   11/30/16 1414  BP: (!) 132/109  Pulse: 70  Resp: 18  Temp: 98.1 F (36.7 C)  TempSrc: Oral  SpO2: 99%  Weight: 190 lb (86.2 kg)  Height: '5\' 8"'$  (1.727 m)   BMI Assessment: Estimated body mass index is 28.89 kg/m as calculated from the following:   Height as of this encounter: '5\' 8"'$  (1.727 m).   Weight as of this encounter: 190 lb (86.2 kg).  BMI interpretation table: BMI level Category Range association with higher incidence of chronic pain  <18 kg/m2 Underweight   18.5-24.9 kg/m2 Ideal body weight   25-29.9 kg/m2 Overweight Increased incidence by 20%  30-34.9 kg/m2 Obese (Class I) Increased incidence by 68%  35-39.9 kg/m2 Severe obesity (Class II) Increased incidence by 136%  >40 kg/m2 Extreme obesity (Class III) Increased incidence by 254%   BMI Readings from Last 4 Encounters:  11/30/16 28.89 kg/m  11/29/16 28.94 kg/m  11/01/16 28.65 kg/m  09/27/16 28.63 kg/m   Wt Readings from Last 4 Encounters:  11/30/16 190 lb (86.2 kg)  11/29/16 190 lb 4.8 oz (86.3 kg)  11/01/16 188 lb 6.4  oz (85.5 kg)  09/27/16 188 lb 4.8 oz (85.4 kg)  Psych/Mental status: Alert, oriented x 3 (person, place, &  time)       Eyes: PERLA Respiratory: No evidence of acute respiratory distress  Cervical Spine Area Exam  Skin & Axial Inspection: No masses, redness, edema, swelling, or associated skin lesions Alignment: Symmetrical Functional ROM: Unrestricted ROM      Stability: No instability detected Muscle Tone/Strength: Functionally intact. No obvious neuro-muscular anomalies detected. Sensory (Neurological): Unimpaired Palpation: No palpable anomalies              Upper Extremity (UE) Exam    Side: Right upper extremity  Side: Left upper extremity  Skin & Extremity Inspection: Skin color, temperature, and hair growth are WNL. No peripheral edema or cyanosis. No masses, redness, swelling, asymmetry, or associated skin lesions. No contractures.  Skin & Extremity Inspection: Skin color, temperature, and hair growth are WNL. No peripheral edema or cyanosis. No masses, redness, swelling, asymmetry, or associated skin lesions. No contractures.  Functional ROM: Unrestricted ROM          Functional ROM: Unrestricted ROM          Muscle Tone/Strength: Functionally intact. No obvious neuro-muscular anomalies detected.  Muscle Tone/Strength: Functionally intact. No obvious neuro-muscular anomalies detected.  Sensory (Neurological): Unimpaired          Sensory (Neurological): Unimpaired          Palpation: No palpable anomalies              Palpation: No palpable anomalies              Specialized Test(s): Deferred         Specialized Test(s): Deferred          Thoracic Spine Area Exam  Skin & Axial Inspection: No masses, redness, or swelling Alignment: Symmetrical Functional ROM: Unrestricted ROM Stability: No instability detected Muscle Tone/Strength: Functionally intact. No obvious neuro-muscular anomalies detected. Sensory (Neurological): Unimpaired Muscle strength & Tone: No palpable  anomalies  Lumbar Spine Area Exam  Skin & Axial Inspection: No masses, redness, or swelling Alignment: Asymmetric Functional ROM: Decreased ROM, bilaterallyseverely limited lumbar extension Stability: No instability detected Muscle Tone/Strength: Functionally intact. No obvious neuro-muscular anomalies detected. Sensory (Neurological): Dermatomal pain pattern and musculoskeletal referral pattern Palpation: Complains of area being tender to palpation Bilateral Fist Percussion Test Provocative Tests: Lumbar Hyperextension and rotation test: Positive bilaterally for facet joint pain.left greater than right Lumbar Lateral bending test: Positive ipsilateral radicular pain, bilaterally. Positive for bilateral foraminal stenosis. Patrick's Maneuver: evaluation deferred today                   Positive straight leg raise test bilaterally Gait & Posture Assessment  Ambulation: Limited using cane Gait: Age-related, senile gait pattern Posture: WNL   Lower Extremity Exam    Side: Right lower extremity  Side: Left lower extremity  Skin & Extremity Inspection: Skin color, temperature, and hair growth are WNL. No peripheral edema or cyanosis. No masses, redness, swelling, asymmetry, or associated skin lesions. No contractures.  Skin & Extremity Inspection: Skin color, temperature, and hair growth are WNL. No peripheral edema or cyanosis. No masses, redness, swelling, asymmetry, or associated skin lesions. No contractures.  Functional ROM: Unrestricted ROM          Functional ROM: Unrestricted ROM          Muscle Tone/Strength: Functionally intact. No obvious neuro-muscular anomalies detected.  Muscle Tone/Strength: Functionally intact. No obvious neuro-muscular anomalies detected.  Sensory (Neurological): Unimpaired  Sensory (Neurological): Unimpaired  Palpation: No palpable anomalies  Palpation: No palpable anomalies  Assessment  Primary Diagnosis & Pertinent Problem List: The primary encounter  diagnosis was Lumbar degenerative disc disease. Diagnoses of Spinal stenosis, lumbar region, with neurogenic claudication, Neuroforaminal stenosis of lumbar spine, Chronic pain syndrome, CKD (chronic kidney disease) stage 3, GFR 30-59 ml/min, Chronic diastolic congestive heart failure ([place]), and Lumbar radiculopathy were also pertinent to this visit.  Visit Diagnosis (New problems to examiner): 1. Lumbar degenerative disc disease   2. Spinal stenosis, lumbar region, with neurogenic claudication   3. Neuroforaminal stenosis of lumbar spine   4. Chronic pain syndrome   5. CKD (chronic kidney disease) stage 3, GFR 30-59 ml/min   6. Chronic diastolic congestive heart failure ([facility])   7. Lumbar radiculopathy    Plan of Care (Initial workup plan)  Note: Please be advised that as per protocol, today's visit has been an evaluation only. We have not taken over the patient's controlled substance management.  General Recommendations: The pain condition that the patient suffers from is best treated with a multidisciplinary approach that involves an increase in physical activity to prevent de-conditioning and worsening of the pain cycle, as well as psychological counseling (formal and/or informal) to address the co-morbid psychological affects of pain. Treatment will often involve judicious use of pain medications and interventional procedures to decrease the pain, allowing the patient to participate in the physical activity that will ultimately produce long-lasting pain reductions. The goal of the multidisciplinary approach is to return the patient to a higher level of overall function and to restore their ability to perform activities of daily living.  77 year old male with a past medical history of CHF, type 2 diabetes on insulin, history of DVT, GERD, hypertension, history of prostate cancer status post prostatectomy, stage III, chronic kidney disease who presents with low back pain that radiates into  bilateral legs secondary to lumbar and neuroforaminal stenosis secondary to disc bulge most pronounced at L3-L4, left greater than right. Physical exam is consistent with lumbosacral radiculopathy given positive straight leg raise test and lateral bending were positive for neuroforaminal disease and radicular disease.  We discussed performing an interlaminar lumbar epidural steroid injection as well as the potential risks and benefits of it. Patient is interested and would like to proceed.  Also provide the patient a referral to physical therapy secondary to his lumbar deconditioning and reduced range of motion. I recommend starting with aquatic therapy first followed by land-based therapy with focus on [place] techniques for spinal rehabilitation.  Plan: -Referral to physical therapy, recommend starting with aquatic therapy first -L3-L4 interlaminar lumbar epidural steroid injection   Ordered Lab-work, Procedure(s), Referral(s), & Consult(s): Orders Placed This Encounter  Procedures  . Lumbar Epidural Injection  . Ambulatory referral to Physical Therapy   Pharmacotherapy (current): Medications ordered:  No orders of the defined types were placed in this encounter.  Medications administered during this visit: Mr. Rodin had no medications administered during this visit.   Pharmacological management options:  Opioid Analgesics: The patient was informed that there is no guarantee that he would be a candidate for opioid analgesics. The decision will be made following CDC guidelines. This decision will be based on the results of diagnostic studies, as well as Mr. Newmark's risk profile.   Membrane stabilizer: To be determined at a later timecurrently on gabapentin 200 mg daily at bedtime  Muscle relaxant: To be determined at a later timecan consider tizanidine baclofen, Flexeril. Make sure these are renally dosed.  NSAID: Medically contraindicated  Other analgesic(s): To be determined at  a  later time   Interventional management options: Mr. Bufford was informed that there is no guarantee that he would be a candidate for interventional therapies. The decision will be based on the results of diagnostic studies, as well as Mr. Karpel's risk profile.  Procedure(s) under consideration:  -lumbar epidural steroid injection -Lumbar medial branch nerve blocks at L3-L5 -Spinal cord stimulator   Provider-requested follow-up: Return for Procedure.  Future Appointments Date Time Provider [place]  02/21/2017 2:00 PM [facility] None  05/30/2017 1:45 PM [facility] None  05/30/2017 2:00 PM Sindy Guadeloupe, MD [facility] None    Primary Care Physician: Jesse Athens, MD Location: [facility] Note by: Gillis Santa, M.D, Date: 11/30/2016; Time: 3:02 PM  Patient Instructions   It was nice meeting you today. Today we discussed the following:  -Lumbar epidural steroid injection for your leg pain. -Referral to physical therapy. Recommend starting with aquatic therapy first and then proceeding to land-based therapy thereafter.GENERAL RISKS AND COMPLICATIONS  What are the risk, side effects and possible complications? Generally speaking, most procedures are safe.  However, with any procedure there are risks, side effects, and the possibility of complications.  The risks and complications are dependent upon the sites that are lesioned, or the type of nerve block to be performed.  The closer the procedure is to the spine, the more serious the risks are.  Great care is taken when placing the radio frequency needles, block needles or lesioning probes, but sometimes complications can occur. 1. Infection: Any time there is an injection through the skin, there is a risk of infection.  This is why sterile conditions are used for these blocks.  There are four possible types of infection. 1. Localized skin infection. 2. Central Nervous  System Infection-This can be in the form of Meningitis, which can be deadly. 3. Epidural Infections-This can be in the form of an epidural abscess, which can cause pressure inside of the spine, causing compression of the spinal cord with subsequent paralysis. This would require an emergency surgery to decompress, and there are no guarantees that the patient would recover from the paralysis. 4. Discitis-This is an infection of the intervertebral discs.  It occurs in about 1% of discography procedures.  It is difficult to treat and it may lead to surgery.        2. Pain: the needles have to go through skin and soft tissues, will cause soreness.       3. Damage to internal structures:  The nerves to be lesioned may be near blood vessels or    other nerves which can be potentially damaged.       4. Bleeding: Bleeding is more common if the patient is taking blood thinners such as  aspirin, Coumadin, Ticiid, Plavix, etc., or if he/she have some genetic predisposition  such as hemophilia. Bleeding into the spinal canal can cause compression of the spinal  cord with subsequent paralysis.  This would require an emergency surgery to  decompress and there are no guarantees that the patient would recover from the  paralysis.       5. Pneumothorax:  Puncturing of a lung is a possibility, every time a needle is introduced in  the area of the chest or upper back.  Pneumothorax refers to free air around the  collapsed lung(s), inside of the thoracic cavity (chest cavity).  Another two possible  complications related to a similar event would include: Hemothorax and Chylothorax.  These are variations of the Pneumothorax, where instead of air around the collapsed  lung(s), you may have blood or chyle, respectively.       6. Spinal headaches: They may occur with any procedures in the area of the spine.       7. Persistent CSF (Cerebro-Spinal Fluid) leakage: This is a rare problem, but may occur  with prolonged intrathecal or  epidural catheters either due to the formation of a fistulous  track or a dural tear.       8. Nerve damage: By working so close to the spinal cord, there is always a possibility of  nerve damage, which could be as serious as a permanent spinal cord injury with  paralysis.       9. Death:  Although rare, severe deadly allergic reactions known as "Anaphylactic  reaction" can occur to any of the medications used.      10. Worsening of the symptoms:  We can always make thing worse.  What are the chances of something like this happening? Chances of any of this occuring are extremely low.  By statistics, you have more of a chance of getting killed in a motor vehicle accident: while driving to the hospital than any of the above occurring .  Nevertheless, you should be aware that they are possibilities.  In general, it is similar to taking a shower.  Everybody knows that you can slip, hit your head and get killed.  Does that mean that you should not shower again?  Nevertheless always keep in mind that statistics do not mean anything if you happen to be on the wrong side of them.  Even if a procedure has a 1 (one) in a 1,000,000 (million) chance of going wrong, it you happen to be that one..Also, keep in mind that by statistics, you have more of a chance of having something go wrong when taking medications.  Who should not have this procedure? If you are on a blood thinning medication (e.g. Coumadin, Plavix, see list of "Blood Thinners"), or if you have an active infection going on, you should not have the procedure.  If you are taking any blood thinners, please inform your physician.  How should I prepare for this procedure?  Do not eat or drink anything at least six hours prior to the procedure.  Bring a driver with you .  It cannot be a taxi.  Come accompanied by an adult that can drive you back, and that is strong enough to help you if your legs get weak or numb from the local anesthetic.  Take all of  your medicines the morning of the procedure with just enough water to swallow them.  If you have diabetes, make sure that you are scheduled to have your procedure done first thing in the morning, whenever possible.  If you have diabetes, take only half of your insulin dose and notify our nurse that you have done so as soon as you arrive at the clinic.  If you are diabetic, but only take blood sugar pills (oral hypoglycemic), then do not take them on the morning of your procedure.  You may take them after you have had the procedure.  Do not take aspirin or any aspirin-containing medications, at least eleven (11) days prior to the procedure.  They may prolong bleeding.  Wear loose fitting clothing that may be easy to take off and that you would not mind if it got stained with Betadine or blood.  Do not wear any jewelry or perfume  Remove any nail coloring.  It will interfere with some of our monitoring equipment.  NOTE: Remember that this is not meant to be interpreted as a complete list of all possible complications.  Unforeseen problems may occur.  BLOOD THINNERS The following drugs contain aspirin or other products, which can cause increased bleeding during surgery and should not be taken for 2 weeks prior to and 1 week after surgery.  If you should need take something for relief of minor pain, you may take acetaminophen which is found in Tylenol,m Datril, Anacin-3 and Panadol. It is not blood thinner. The products listed below are.  Do not take any of the products listed below in addition to any listed on your instruction sheet.  A.P.C or A.P.C with Codeine Codeine Phosphate Capsules #3 Ibuprofen Ridaura  ABC compound Congesprin Imuran rimadil  Advil Cope Indocin Robaxisal  Alka-Seltzer Effervescent Pain Reliever and Antacid Coricidin or Coricidin-D  Indomethacin Rufen  Alka-Seltzer plus Cold Medicine Cosprin Ketoprofen S-A-C Tablets  Anacin Analgesic Tablets or Capsules Coumadin  Korlgesic Salflex  Anacin Extra Strength Analgesic tablets or capsules CP-2 Tablets Lanoril Salicylate  Anaprox Cuprimine Capsules Levenox Salocol  Anexsia-D Dalteparin Magan Salsalate  Anodynos Darvon compound Magnesium Salicylate Sine-off  Ansaid Dasin Capsules Magsal Sodium Salicylate  Anturane Depen Capsules Marnal Soma  APF Arthritis pain formula Dewitt's Pills Measurin Stanback  Argesic Dia-Gesic Meclofenamic Sulfinpyrazone  Arthritis Bayer Timed Release Aspirin Diclofenac Meclomen Sulindac  Arthritis pain formula Anacin Dicumarol Medipren Supac  Analgesic (Safety coated) Arthralgen Diffunasal Mefanamic Suprofen  Arthritis Strength Bufferin Dihydrocodeine Mepro Compound Suprol  Arthropan liquid Dopirydamole Methcarbomol with Aspirin Synalgos  ASA tablets/Enseals Disalcid Micrainin Tagament  Ascriptin Doan's Midol Talwin  Ascriptin A/D Dolene Mobidin Tanderil  Ascriptin Extra Strength Dolobid Moblgesic Ticlid  Ascriptin with Codeine Doloprin or Doloprin with Codeine Momentum Tolectin  Asperbuf Duoprin Mono-gesic Trendar  Aspergum Duradyne Motrin or Motrin IB Triminicin  Aspirin plain, buffered or enteric coated Durasal Myochrisine Trigesic  Aspirin Suppositories Easprin Nalfon Trillsate  Aspirin with Codeine Ecotrin Regular or Extra Strength Naprosyn Uracel  Atromid-S Efficin Naproxen Ursinus  Auranofin Capsules Elmiron Neocylate Vanquish  Axotal Emagrin Norgesic Verin  Azathioprine Empirin or Empirin with Codeine Normiflo Vitamin E  Azolid Emprazil Nuprin Voltaren  Bayer Aspirin plain, buffered or children's or timed BC Tablets or powders Encaprin Orgaran Warfarin Sodium  Buff-a-Comp Enoxaparin Orudis Zorpin  Buff-a-Comp with Codeine Equegesic Os-Cal-Gesic   Buffaprin Excedrin plain, buffered or Extra Strength Oxalid   Bufferin Arthritis Strength Feldene Oxphenbutazone   Bufferin plain or Extra Strength Feldene Capsules Oxycodone with Aspirin   Bufferin with Codeine  Fenoprofen Fenoprofen Pabalate or Pabalate-SF   Buffets II Flogesic Panagesic   Buffinol plain or Extra Strength Florinal or Florinal with Codeine Panwarfarin   Buf-Tabs Flurbiprofen Penicillamine   Butalbital Compound Four-way cold tablets Penicillin   Butazolidin Fragmin Pepto-Bismol   Carbenicillin Geminisyn Percodan   Carna Arthritis Reliever Geopen Persantine   Carprofen Gold's salt Persistin   Chloramphenicol Goody's Phenylbutazone   Chloromycetin Haltrain Piroxlcam   Clmetidine heparin Plaquenil   Cllnoril Hyco-pap Ponstel   Clofibrate Hydroxy chloroquine Propoxyphen         Before stopping any of these medications, be sure to consult the physician who ordered them.  Some, such as Coumadin (Warfarin) are ordered to prevent or treat serious conditions such as "deep thrombosis", "pumonary embolisms", and other heart problems.  The amount of time that you may need off of the  medication may also vary with the medication and the reason for which you were taking it.  If you are taking any of these medications, please make sure you notify your pain physician before you undergo any procedures.         Pain Management Discharge Instructions  General Discharge Instructions :  If you need to reach your doctor call: Monday-Friday 8:00 am - 4:00 pm at [phone] or toll free [phone].  After clinic hours [phone] to have operator reach doctor.  Bring all of your medication bottles to all your appointments in the pain clinic.  To cancel or reschedule your appointment with Pain Management please remember to call 24 hours in advance to avoid a fee.  Refer to the educational materials which you have been given on: General Risks, I had my Procedure. Discharge Instructions, Post Sedation.  Post Procedure Instructions:  The drugs you were given will stay in your system until tomorrow, so for the next 24 hours you should not drive, make any legal decisions or drink any alcoholic  beverages.  You may eat anything you prefer, but it is better to start with liquids then soups and crackers, and gradually work up to solid foods.  Please notify your doctor immediately if you have any unusual bleeding, trouble breathing or pain that is not related to your normal pain.  Depending on the type of procedure that was done, some parts of your body may feel week and/or numb.  This usually clears up by tonight or the next day.  Walk with the use of an assistive device or accompanied by an adult for the 24 hours.  You may use ice on the affected area for the first 24 hours.  Put ice in a Ziploc bag and cover with a towel and place against area 15 minutes on 15 minutes off.  You may switch to heat after 24 hours.Epidural Steroid Injection Patient Information  Description: The epidural space surrounds the nerves as they exit the spinal cord.  In some patients, the nerves can be compressed and inflamed by a bulging disc or a tight spinal canal (spinal stenosis).  By injecting steroids into the epidural space, we can bring irritated nerves into direct contact with a potentially helpful medication.  These steroids act directly on the irritated nerves and can reduce swelling and inflammation which often leads to decreased pain.  Epidural steroids may be injected anywhere along the spine and from the neck to the low back depending upon the location of your pain.   After numbing the skin with local anesthetic (like Novocaine), a small needle is passed into the epidural space slowly.  You may experience a sensation of pressure while this is being done.  The entire block usually last less than 10 minutes.  Conditions which may be treated by epidural steroids:   Low back and leg pain  Neck and arm pain  Spinal stenosis  Post-laminectomy syndrome  Herpes zoster (shingles) pain  Pain from compression fractures  Preparation for the injection:  1. Do not eat any solid food or dairy  products within 8 hours of your appointment.  2. You may drink clear liquids up to 3 hours before appointment.  Clear liquids include water, black coffee, juice or soda.  No milk or cream please. 3. You may take your regular medication, including pain medications, with a sip of water before your appointment  Diabetics should hold regular insulin (if taken separately) and take 1/2 normal NPH dos the  morning of the procedure.  Carry some sugar containing items with you to your appointment. 4. A driver must accompany you and be prepared to drive you home after your procedure.  5. Bring all your current medications with your. 6. An IV may be inserted and sedation may be given at the discretion of the physician.   7. A blood pressure cuff, EKG and other monitors will often be applied during the procedure.  Some patients may need to have extra oxygen administered for a short period. 8. You will be asked to provide medical information, including your allergies, prior to the procedure.  We must know immediately if you are taking blood thinners (like Coumadin/Warfarin)  Or if you are allergic to IV iodine contrast (dye). We must know if you could possible be pregnant.  Possible side-effects:  Bleeding from needle site  Infection (rare, may require surgery)  Nerve injury (rare)  Numbness & tingling (temporary)  Difficulty urinating (rare, temporary)  Spinal headache ( a headache worse with upright posture)  Light -headedness (temporary)  Pain at injection site (several days)  Decreased blood pressure (temporary)  Weakness in arm/leg (temporary)  Pressure sensation in back/neck (temporary)  Call if you experience:  Fever/chills associated with headache or increased back/neck pain.  Headache worsened by an upright position.  New onset weakness or numbness of an extremity below the injection site  Hives or difficulty breathing (go to the emergency room)  Inflammation or drainage at the  infection site  Severe back/neck pain  Any new symptoms which are concerning to you  Please note:  Although the local anesthetic injected can often make your back or neck feel good for several hours after the injection, the pain will likely return.  It takes 3-7 days for steroids to work in the epidural space.  You may not notice any pain relief for at least that one week.  If effective, we will often do a series of three injections spaced 3-6 weeks apart to maximally decrease your pain.  After the initial series, we generally will wait several months before considering a repeat injection of the same type.  If you have any questions, please call [phone] [facility]

## 2016-11-30 NOTE — Progress Notes (Signed)
Safety precautions to be maintained throughout the outpatient stay will include: orient to surroundings, keep bed in low position, maintain call bell within reach at all times, provide assistance with transfer out of bed and ambulation.  

## 2016-11-30 NOTE — Patient Instructions (Addendum)
It was nice meeting you today. Today we discussed the following:  -Lumbar epidural steroid injection for your leg pain. -Referral to physical therapy. Recommend starting with aquatic therapy first and then proceeding to land-based therapy thereafter.GENERAL RISKS AND COMPLICATIONS  What are the risk, side effects and possible complications? Generally speaking, most procedures are safe.  However, with any procedure there are risks, side effects, and the possibility of complications.  The risks and complications are dependent upon the sites that are lesioned, or the type of nerve block to be performed.  The closer the procedure is to the spine, the more serious the risks are.  Great care is taken when placing the radio frequency needles, block needles or lesioning probes, but sometimes complications can occur. 1. Infection: Any time there is an injection through the skin, there is a risk of infection.  This is why sterile conditions are used for these blocks.  There are four possible types of infection. 1. Localized skin infection. 2. Central Nervous System Infection-This can be in the form of Meningitis, which can be deadly. 3. Epidural Infections-This can be in the form of an epidural abscess, which can cause pressure inside of the spine, causing compression of the spinal cord with subsequent paralysis. This would require an emergency surgery to decompress, and there are no guarantees that the patient would recover from the paralysis. 4. Discitis-This is an infection of the intervertebral discs.  It occurs in about 1% of discography procedures.  It is difficult to treat and it may lead to surgery.        2. Pain: the needles have to go through skin and soft tissues, will cause soreness.       3. Damage to internal structures:  The nerves to be lesioned may be near blood vessels or    other nerves which can be potentially damaged.       4. Bleeding: Bleeding is more common if the patient is taking  blood thinners such as  aspirin, Coumadin, Ticiid, Plavix, etc., or if he/she have some genetic predisposition  such as hemophilia. Bleeding into the spinal canal can cause compression of the spinal  cord with subsequent paralysis.  This would require an emergency surgery to  decompress and there are no guarantees that the patient would recover from the  paralysis.       5. Pneumothorax:  Puncturing of a lung is a possibility, every time a needle is introduced in  the area of the chest or upper back.  Pneumothorax refers to free air around the  collapsed lung(s), inside of the thoracic cavity (chest cavity).  Another two possible  complications related to a similar event would include: Hemothorax and Chylothorax.   These are variations of the Pneumothorax, where instead of air around the collapsed  lung(s), you may have blood or chyle, respectively.       6. Spinal headaches: They may occur with any procedures in the area of the spine.       7. Persistent CSF (Cerebro-Spinal Fluid) leakage: This is a rare problem, but may occur  with prolonged intrathecal or epidural catheters either due to the formation of a fistulous  track or a dural tear.       8. Nerve damage: By working so close to the spinal cord, there is always a possibility of  nerve damage, which could be as serious as a permanent spinal cord injury with  paralysis.       9. Death:  Although rare, severe deadly allergic reactions known as "Anaphylactic  reaction" can occur to any of the medications used.      10. Worsening of the symptoms:  We can always make thing worse.  What are the chances of something like this happening? Chances of any of this occuring are extremely low.  By statistics, you have more of a chance of getting killed in a motor vehicle accident: while driving to the hospital than any of the above occurring .  Nevertheless, you should be aware that they are possibilities.  In general, it is similar to taking a shower.  Everybody  knows that you can slip, hit your head and get killed.  Does that mean that you should not shower again?  Nevertheless always keep in mind that statistics do not mean anything if you happen to be on the wrong side of them.  Even if a procedure has a 1 (one) in a 1,000,000 (million) chance of going wrong, it you happen to be that one..Also, keep in mind that by statistics, you have more of a chance of having something go wrong when taking medications.  Who should not have this procedure? If you are on a blood thinning medication (e.g. Coumadin, Plavix, see list of "Blood Thinners"), or if you have an active infection going on, you should not have the procedure.  If you are taking any blood thinners, please inform your physician.  How should I prepare for this procedure?  Do not eat or drink anything at least six hours prior to the procedure.  Bring a driver with you .  It cannot be a taxi.  Come accompanied by an adult that can drive you back, and that is strong enough to help you if your legs get weak or numb from the local anesthetic.  Take all of your medicines the morning of the procedure with just enough water to swallow them.  If you have diabetes, make sure that you are scheduled to have your procedure done first thing in the morning, whenever possible.  If you have diabetes, take only half of your insulin dose and notify our nurse that you have done so as soon as you arrive at the clinic.  If you are diabetic, but only take blood sugar pills (oral hypoglycemic), then do not take them on the morning of your procedure.  You may take them after you have had the procedure.  Do not take aspirin or any aspirin-containing medications, at least eleven (11) days prior to the procedure.  They may prolong bleeding.  Wear loose fitting clothing that may be easy to take off and that you would not mind if it got stained with Betadine or blood.  Do not wear any jewelry or perfume  Remove any nail  coloring.  It will interfere with some of our monitoring equipment.  NOTE: Remember that this is not meant to be interpreted as a complete list of all possible complications.  Unforeseen problems may occur.  BLOOD THINNERS The following drugs contain aspirin or other products, which can cause increased bleeding during surgery and should not be taken for 2 weeks prior to and 1 week after surgery.  If you should need take something for relief of minor pain, you may take acetaminophen which is found in Tylenol,m Datril, Anacin-3 and Panadol. It is not blood thinner. The products listed below are.  Do not take any of the products listed below in addition to any listed on your instruction sheet.  A.P.C or A.P.C with Codeine Codeine Phosphate Capsules #3 Ibuprofen Ridaura  ABC compound Congesprin Imuran rimadil  Advil Cope Indocin Robaxisal  Alka-Seltzer Effervescent Pain Reliever and Antacid Coricidin or Coricidin-D  Indomethacin Rufen  Alka-Seltzer plus Cold Medicine Cosprin Ketoprofen S-A-C Tablets  Anacin Analgesic Tablets or Capsules Coumadin Korlgesic Salflex  Anacin Extra Strength Analgesic tablets or capsules CP-2 Tablets Lanoril Salicylate  Anaprox Cuprimine Capsules Levenox Salocol  Anexsia-D Dalteparin Magan Salsalate  Anodynos Darvon compound Magnesium Salicylate Sine-off  Ansaid Dasin Capsules Magsal Sodium Salicylate  Anturane Depen Capsules Marnal Soma  APF Arthritis pain formula Dewitt's Pills Measurin Stanback  Argesic Dia-Gesic Meclofenamic Sulfinpyrazone  Arthritis Bayer Timed Release Aspirin Diclofenac Meclomen Sulindac  Arthritis pain formula Anacin Dicumarol Medipren Supac  Analgesic (Safety coated) Arthralgen Diffunasal Mefanamic Suprofen  Arthritis Strength Bufferin Dihydrocodeine Mepro Compound Suprol  Arthropan liquid Dopirydamole Methcarbomol with Aspirin Synalgos  ASA tablets/Enseals Disalcid Micrainin Tagament  Ascriptin Doan's Midol Talwin  Ascriptin A/D Dolene  Mobidin Tanderil  Ascriptin Extra Strength Dolobid Moblgesic Ticlid  Ascriptin with Codeine Doloprin or Doloprin with Codeine Momentum Tolectin  Asperbuf Duoprin Mono-gesic Trendar  Aspergum Duradyne Motrin or Motrin IB Triminicin  Aspirin plain, buffered or enteric coated Durasal Myochrisine Trigesic  Aspirin Suppositories Easprin Nalfon Trillsate  Aspirin with Codeine Ecotrin Regular or Extra Strength Naprosyn Uracel  Atromid-S Efficin Naproxen Ursinus  Auranofin Capsules Elmiron Neocylate Vanquish  Axotal Emagrin Norgesic Verin  Azathioprine Empirin or Empirin with Codeine Normiflo Vitamin E  Azolid Emprazil Nuprin Voltaren  Bayer Aspirin plain, buffered or children's or timed BC Tablets or powders Encaprin Orgaran Warfarin Sodium  Buff-a-Comp Enoxaparin Orudis Zorpin  Buff-a-Comp with Codeine Equegesic Os-Cal-Gesic   Buffaprin Excedrin plain, buffered or Extra Strength Oxalid   Bufferin Arthritis Strength Feldene Oxphenbutazone   Bufferin plain or Extra Strength Feldene Capsules Oxycodone with Aspirin   Bufferin with Codeine Fenoprofen Fenoprofen Pabalate or Pabalate-SF   Buffets II Flogesic Panagesic   Buffinol plain or Extra Strength Florinal or Florinal with Codeine Panwarfarin   Buf-Tabs Flurbiprofen Penicillamine   Butalbital Compound Four-way cold tablets Penicillin   Butazolidin Fragmin Pepto-Bismol   Carbenicillin Geminisyn Percodan   Carna Arthritis Reliever Geopen Persantine   Carprofen Gold's salt Persistin   Chloramphenicol Goody's Phenylbutazone   Chloromycetin Haltrain Piroxlcam   Clmetidine heparin Plaquenil   Cllnoril Hyco-pap Ponstel   Clofibrate Hydroxy chloroquine Propoxyphen         Before stopping any of these medications, be sure to consult the physician who ordered them.  Some, such as Coumadin (Warfarin) are ordered to prevent or treat serious conditions such as "deep thrombosis", "pumonary embolisms", and other heart problems.  The amount of time that  you may need off of the medication may also vary with the medication and the reason for which you were taking it.  If you are taking any of these medications, please make sure you notify your pain physician before you undergo any procedures.         Pain Management Discharge Instructions  General Discharge Instructions :  If you need to reach your doctor call: Monday-Friday 8:00 am - 4:00 pm at [phone] or toll free [phone].  After clinic hours [phone] to have operator reach doctor.  Bring all of your medication bottles to all your appointments in the pain clinic.  To cancel or reschedule your appointment with Pain Management please remember to call 24 hours in advance to avoid a fee.  Refer to the educational materials which you have been given  on: General Risks, I had my Procedure. Discharge Instructions, Post Sedation.  Post Procedure Instructions:  The drugs you were given will stay in your system until tomorrow, so for the next 24 hours you should not drive, make any legal decisions or drink any alcoholic beverages.  You may eat anything you prefer, but it is better to start with liquids then soups and crackers, and gradually work up to solid foods.  Please notify your doctor immediately if you have any unusual bleeding, trouble breathing or pain that is not related to your normal pain.  Depending on the type of procedure that was done, some parts of your body may feel week and/or numb.  This usually clears up by tonight or the next day.  Walk with the use of an assistive device or accompanied by an adult for the 24 hours.  You may use ice on the affected area for the first 24 hours.  Put ice in a Ziploc bag and cover with a towel and place against area 15 minutes on 15 minutes off.  You may switch to heat after 24 hours.Epidural Steroid Injection Patient Information  Description: The epidural space surrounds the nerves as they exit the spinal cord.  In some  patients, the nerves can be compressed and inflamed by a bulging disc or a tight spinal canal (spinal stenosis).  By injecting steroids into the epidural space, we can bring irritated nerves into direct contact with a potentially helpful medication.  These steroids act directly on the irritated nerves and can reduce swelling and inflammation which often leads to decreased pain.  Epidural steroids may be injected anywhere along the spine and from the neck to the low back depending upon the location of your pain.   After numbing the skin with local anesthetic (like Novocaine), a small needle is passed into the epidural space slowly.  You may experience a sensation of pressure while this is being done.  The entire block usually last less than 10 minutes.  Conditions which may be treated by epidural steroids:   Low back and leg pain  Neck and arm pain  Spinal stenosis  Post-laminectomy syndrome  Herpes zoster (shingles) pain  Pain from compression fractures  Preparation for the injection:  1. Do not eat any solid food or dairy products within 8 hours of your appointment.  2. You may drink clear liquids up to 3 hours before appointment.  Clear liquids include water, black coffee, juice or soda.  No milk or cream please. 3. You may take your regular medication, including pain medications, with a sip of water before your appointment  Diabetics should hold regular insulin (if taken separately) and take 1/2 normal NPH dos the morning of the procedure.  Carry some sugar containing items with you to your appointment. 4. A driver must accompany you and be prepared to drive you home after your procedure.  5. Bring all your current medications with your. 6. An IV may be inserted and sedation may be given at the discretion of the physician.   7. A blood pressure cuff, EKG and other monitors will often be applied during the procedure.  Some patients may need to have extra oxygen administered for a short  period. 8. You will be asked to provide medical information, including your allergies, prior to the procedure.  We must know immediately if you are taking blood thinners (like Coumadin/Warfarin)  Or if you are allergic to IV iodine contrast (dye). We must know if you  could possible be pregnant.  Possible side-effects:  Bleeding from needle site  Infection (rare, may require surgery)  Nerve injury (rare)  Numbness & tingling (temporary)  Difficulty urinating (rare, temporary)  Spinal headache ( a headache worse with upright posture)  Light -headedness (temporary)  Pain at injection site (several days)  Decreased blood pressure (temporary)  Weakness in arm/leg (temporary)  Pressure sensation in back/neck (temporary)  Call if you experience:  Fever/chills associated with headache or increased back/neck pain.  Headache worsened by an upright position.  New onset weakness or numbness of an extremity below the injection site  Hives or difficulty breathing (go to the emergency room)  Inflammation or drainage at the infection site  Severe back/neck pain  Any new symptoms which are concerning to you  Please note:  Although the local anesthetic injected can often make your back or neck feel good for several hours after the injection, the pain will likely return.  It takes 3-7 days for steroids to work in the epidural space.  You may not notice any pain relief for at least that one week.  If effective, we will often do a series of three injections spaced 3-6 weeks apart to maximally decrease your pain.  After the initial series, we generally will wait several months before considering a repeat injection of the same type.  If you have any questions, please call [phone] [facility]

## 2016-12-02 DIAGNOSIS — Z0000 Encounter for general adult medical examination without abnormal findings: Secondary | ICD-10-CM | POA: Diagnosis not present

## 2016-12-12 ENCOUNTER — Ambulatory Visit (HOSPITAL_BASED_OUTPATIENT_CLINIC_OR_DEPARTMENT_OTHER): Payer: Medicare HMO | Admitting: Student in an Organized Health Care Education/Training Program

## 2016-12-12 ENCOUNTER — Encounter: Payer: Self-pay | Admitting: Student in an Organized Health Care Education/Training Program

## 2016-12-12 ENCOUNTER — Ambulatory Visit
Admission: RE | Admit: 2016-12-12 | Discharge: 2016-12-12 | Disposition: A | Discharge status: Home / Self Care | Payer: Medicare HMO | Source: Ambulatory Visit | Attending: Student in an Organized Health Care Education/Training Program | Admitting: Student in an Organized Health Care Education/Training Program

## 2016-12-12 VITALS — BP 125/67 | HR 70 | Temp 98.2°F | Resp 16 | Ht 68.0 in | Wt 193.0 lb

## 2016-12-12 DIAGNOSIS — M5116 Intervertebral disc disorders with radiculopathy, lumbar region: Secondary | ICD-10-CM | POA: Insufficient documentation

## 2016-12-12 DIAGNOSIS — G894 Chronic pain syndrome: Secondary | ICD-10-CM

## 2016-12-12 DIAGNOSIS — M5416 Radiculopathy, lumbar region: Secondary | ICD-10-CM

## 2016-12-12 DIAGNOSIS — M9983 Other biomechanical lesions of lumbar region: Secondary | ICD-10-CM

## 2016-12-12 DIAGNOSIS — M79605 Pain in left leg: Secondary | ICD-10-CM | POA: Insufficient documentation

## 2016-12-12 DIAGNOSIS — M5136 Other intervertebral disc degeneration, lumbar region: Secondary | ICD-10-CM | POA: Diagnosis not present

## 2016-12-12 DIAGNOSIS — M48062 Spinal stenosis, lumbar region with neurogenic claudication: Secondary | ICD-10-CM

## 2016-12-12 DIAGNOSIS — M48061 Spinal stenosis, lumbar region without neurogenic claudication: Secondary | ICD-10-CM

## 2016-12-12 MED ORDER — LIDOCAINE HCL (PF) 1 % IJ SOLN
10.0000 mL | Freq: Once | INTRAMUSCULAR | Status: AC
  Administered 2016-12-12: 5 mL
  Filled 2016-12-12: qty 10

## 2016-12-12 MED ORDER — DEXAMETHASONE SODIUM PHOSPHATE 10 MG/ML IJ SOLN
10.0000 mg | Freq: Once | INTRAMUSCULAR | Status: AC
  Administered 2016-12-12: 10 mg
  Filled 2016-12-12: qty 1

## 2016-12-12 MED ORDER — SODIUM CHLORIDE 0.9% FLUSH
2.0000 mL | Freq: Once | INTRAVENOUS | Status: AC
  Administered 2016-12-12: 10 mL

## 2016-12-12 MED ORDER — IOPAMIDOL (ISOVUE-M 200) INJECTION 41%
10.0000 mL | Freq: Once | INTRAMUSCULAR | Status: AC
  Administered 2016-12-12: 10 mL via EPIDURAL
  Filled 2016-12-12: qty 10

## 2016-12-12 MED ORDER — ROPIVACAINE HCL 2 MG/ML IJ SOLN
2.0000 mL | Freq: Once | INTRAMUSCULAR | Status: AC
  Administered 2016-12-12: 10 mL via EPIDURAL
  Filled 2016-12-12: qty 10

## 2016-12-12 NOTE — Progress Notes (Signed)
Patient's Name: Jesse Dickson  MRN: [id number]  Referring Provider: Gillis Santa, MD  DOB: 1940/08/20  PCP: Cletis Athens, MD  DOS: 12/12/2016  Note by: Gillis Santa, MD  Service setting: Ambulatory outpatient  Specialty: Interventional Pain Management  Patient type: Established  Location: [facility] (AMB) Pain Management Facility  Visit type: Interventional Procedure   Primary Reason for Visit: Interventional Pain Management Treatment. CC: Back Pain (pain radiating down left leg)  Procedure:  Anesthesia, Analgesia, Anxiolysis:  Type: Therapeutic Inter-Laminar Epidural Steroid Injection Region: Lumbar Level: L5-S1 Level. Laterality: Left-Sided         Type: Local Anesthesia with Moderate (Conscious) Sedation Local Anesthetic: Lidocaine 1% Route: Intravenous (IV) IV Access: Secured Sedation: Meaningful verbal contact was maintained at all times during the procedure  Indication(s): Analgesia and Anxiety   Indications: 1. Lumbar degenerative disc disease   2. Lumbar radiculopathy   3. Neuroforaminal stenosis of lumbar spine   4. Spinal stenosis, lumbar region, with neurogenic claudication   5. Chronic pain syndrome    Pain Score: Pre-procedure: 5 /10 Post-procedure: 0-No pain/10  Pre-op Assessment:  Jesse Dickson is a 76 y.o. (year old), male patient, seen today for interventional treatment. He  has a past surgical history that includes Prostatectomy. Jesse Dickson has a current medication list which includes the following prescription(s): aspirin ec, atorvastatin, calcitriol, carvedilol, docusate sodium, famotidine, furosemide, gabapentin, insulin glargine, insulin syringe 1cc/30gx5/16", polyethylene glycol, tamsulosin, and tramadol. His primarily concern today is the Back Pain (pain radiating down left leg)  Initial Vital Signs: There were no vitals taken for this visit. BMI: Estimated body mass index is 29.35 kg/m as calculated from the following:   Height as of this encounter: 5\' 8"   (1.727 m).   Weight as of this encounter: 193 lb (87.5 kg).  Risk Assessment: Allergies: Reviewed. He has No Known Allergies.  Allergy Precautions: None required Coagulopathies: Reviewed. None identified.  Blood-thinner therapy: None at this time Active Infection(s): Reviewed. None identified. Jesse Dickson is afebrile  Site Confirmation: Jesse Dickson was asked to confirm the procedure and laterality before marking the site Procedure checklist: Completed Consent: Before the procedure and under the influence of no sedative(s), amnesic(s), or anxiolytics, the patient was informed of the treatment options, risks and possible complications. To fulfill our ethical and legal obligations, as recommended by the American Medical Association's Code of Ethics, I have informed the patient of my clinical impression; the nature and purpose of the treatment or procedure; the risks, benefits, and possible complications of the intervention; the alternatives, including doing nothing; the risk(s) and benefit(s) of the alternative treatment(s) or procedure(s); and the risk(s) and benefit(s) of doing nothing. The patient was provided information about the general risks and possible complications associated with the procedure. These may include, but are not limited to: failure to achieve desired goals, infection, bleeding, organ or nerve damage, allergic reactions, paralysis, and death. In addition, the patient was informed of those risks and complications associated to Spine-related procedures, such as failure to decrease pain; infection (i.e.: Meningitis, epidural or intraspinal abscess); bleeding (i.e.: epidural hematoma, subarachnoid hemorrhage, or any other type of intraspinal or peri-dural bleeding); organ or nerve damage (i.e.: Any type of peripheral nerve, nerve root, or spinal cord injury) with subsequent damage to sensory, motor, and/or autonomic systems, resulting in permanent pain, numbness, and/or weakness of  one or several areas of the body; allergic reactions; (i.e.: anaphylactic reaction); and/or death. Furthermore, the patient was informed of those risks and complications associated with the medications. These  include, but are not limited to: allergic reactions (i.e.: anaphylactic or anaphylactoid reaction(s)); adrenal axis suppression; blood sugar elevation that in diabetics may result in ketoacidosis or comma; water retention that in patients with history of congestive heart failure may result in shortness of breath, pulmonary edema, and decompensation with resultant heart failure; weight gain; swelling or edema; medication-induced neural toxicity; particulate matter embolism and blood vessel occlusion with resultant organ, and/or nervous system infarction; and/or aseptic necrosis of one or more joints. Finally, the patient was informed that Medicine is not an exact science; therefore, there is also the possibility of unforeseen or unpredictable risks and/or possible complications that may result in a catastrophic outcome. The patient indicated having understood very clearly. We have given the patient no guarantees and we have made no promises. Enough time was given to the patient to ask questions, all of which were answered to the patient's satisfaction. Jesse Dickson has indicated that he wanted to continue with the procedure. Attestation: I, the ordering provider, attest that I have discussed with the patient the benefits, risks, side-effects, alternatives, likelihood of achieving goals, and potential problems during recovery for the procedure that I have provided informed consent. Date: 12/12/2016; Time: 10:03 AM  Pre-Procedure Preparation:  Monitoring: As per clinic protocol. Respiration, ETCO2, SpO2, BP, heart rate and rhythm monitor placed and checked for adequate function Safety Precautions: Patient was assessed for positional comfort and pressure points before starting the procedure. Time-out: I  initiated and conducted the "Time-out" before starting the procedure, as per protocol. The patient was asked to participate by confirming the accuracy of the "Time Out" information. Verification of the correct person, site, and procedure were performed and confirmed by me, the nursing staff, and the patient. "Time-out" conducted as per Joint Commission's Universal Protocol (UP.01.01.01). "Time-out" Date & Time: 12/12/2016; 1040 hrs.  Description of Procedure Process:   Position: Prone with head of the table was raised to facilitate breathing. Target Area: The interlaminar space, initially targeting the lower laminar border of the superior vertebral body. Approach: Paramedial approach. Area Prepped: Entire Posterior Lumbar Region Prepping solution: ChloraPrep (2% chlorhexidine gluconate and 70% isopropyl alcohol) Safety Precautions: Aspiration looking for blood return was conducted prior to all injections. At no point did we inject any substances, as a needle was being advanced. No attempts were made at seeking any paresthesias. Safe injection practices and needle disposal techniques used. Medications properly checked for expiration dates. SDV (single dose vial) medications used. Description of the Procedure: Protocol guidelines were followed. The procedure needle was introduced through the skin, ipsilateral to the reported pain, and advanced to the target area. Bone was contacted and the needle walked caudad, until the lamina was cleared. The epidural space was identified using "loss-of-resistance technique" with 2-3 ml of PF-NaCl (0.9% NSS), in a 5cc LOR glass syringe. Vitals:   12/12/16 1047 12/12/16 1052 12/12/16 1058 12/12/16 1059  BP: 125/66 (!) 121/56 127/69 125/67  Pulse: 68 68 68 70  Resp: 14 14 13 16   Temp:      TempSrc:      SpO2: 99% 100% 100% 100%  Weight:      Height:        Start Time: 1040 hrs. End Time: 1059 hrs. Materials:  Needle(s) Type: Epidural needle Gauge: 17G Length:  3.5-in Medication(s): We administered iopamidol, ropivacaine (PF) 2 mg/mL (0.2%), sodium chloride flush, dexamethasone, and lidocaine (PF). Please see chart orders for dosing details. Solution made of 5 cc of normal saline preservative-free, 1 cc of  Decadron 10 mg/cc, 2 cc of 0.2% ropivacaine.Only got 3 cc into the epidural space given leg spasms that patient was experiencing. Patient was unable to tolerate laying supine given spasms so epidural needle was removed after injection of 3 cc.  Patient was able to sit up and stretch his left leg resulting in improvement of leg spasm. Imaging Guidance (Spinal):  Type of Imaging Technique: Fluoroscopy Guidance (Spinal) Indication(s): Assistance in needle guidance and placement for procedures requiring needle placement in or near specific anatomical locations not easily accessible without such assistance. Exposure Time: Please see nurses notes. Contrast: Before injecting any contrast, we confirmed that the patient did not have an allergy to iodine, shellfish, or radiological contrast. Once satisfactory needle placement was completed at the desired level, radiological contrast was injected. Contrast injected under live fluoroscopy. No contrast complications. See chart for type and volume of contrast used. Fluoroscopic Guidance: I was personally present during the use of fluoroscopy. "Tunnel Vision Technique" used to obtain the best possible view of the target area. Parallax error corrected before commencing the procedure. "Direction-depth-direction" technique used to introduce the needle under continuous pulsed fluoroscopy. Once target was reached, antero-posterior, oblique, and lateral fluoroscopic projection used confirm needle placement in all planes. Images permanently stored in EMR. Interpretation: I personally interpreted the imaging intraoperatively. Adequate needle placement confirmed in multiple planes. Appropriate spread of contrast into desired area was  observed. No evidence of afferent or efferent intravascular uptake. No intrathecal or subarachnoid spread observed. Permanent images saved into the patient's record.  Antibiotic Prophylaxis:  Indication(s): None identified Antibiotic given: None  Post-operative Assessment:  EBL: None Complications: No immediate post-treatment complications observed by team, or reported by patient. Note: The patient tolerated the entire procedure well. A repeat set of vitals were taken after the procedure and the patient was kept under observation following institutional policy, for this type of procedure. Post-procedural neurological assessment was performed, showing return to baseline, prior to discharge. The patient was provided with post-procedure discharge instructions, including a section on how to identify potential problems. Should any problems arise concerning this procedure, the patient was given instructions to immediately contact us, at any time, without hesitation. In any case, we plan to contact the patient by telephone for a follow-up status report regarding this interventional procedure. Comments:  No additional relevant information.  Plan of Care  5 out of 5 strength bilateral lower extremity: Plantar flexion, dorsiflexion, knee flexion, knee extension.  Imaging Orders     DG C-Arm 1-60 Min-No Report Procedure Orders    No procedure(s) ordered today    Medications ordered for procedure: Meds ordered this encounter  Medications  . iopamidol (ISOVUE-M) 41 % intrathecal injection 10 mL  . ropivacaine (PF) 2 mg/mL (0.2%) (NAROPIN) injection 2 mL  . sodium chloride flush (NS) 0.9 % injection 2 mL  . dexamethasone (DECADRON) injection 10 mg  . lidocaine (PF) (XYLOCAINE) 1 % injection 10 mL   Medications administered: We administered iopamidol, ropivacaine (PF) 2 mg/mL (0.2%), sodium chloride flush, dexamethasone, and lidocaine (PF).  See the medical record for exact dosing, route, and time  of administration.  New Prescriptions   No medications on file   Disposition: Discharge home  Discharge Date & Time: 12/12/2016; 1105 hrs.   Physician-requested Follow-up: Return in about 2 weeks (around 12/26/2016) for Post Procedure Evaluation. Future Appointments Date Time Provider [place]  01/10/2017 11:30 AM Gillis Santa, MD [facility] None  02/21/2017 2:00 PM [facility] None  05/30/2017 1:45 PM  [facility] None  05/30/2017 2:00 PM Sindy Guadeloupe, MD [facility] None   Primary Care Physician: Cletis Athens, MD Location: [facility] Out[facility] Facility Note by: Gillis Santa, MD Date: 12/12/2016; Time: 1:49 PM  Disclaimer:  Medicine is not an exact science. The only guarantee in medicine is that nothing is guaranteed. It is important to note that the decision to proceed with this intervention was based on the information collected from the patient. The Data and conclusions were drawn from the patient's questionnaire, the interview, and the physical examination. Because the information was provided in large part by the patient, it cannot be guaranteed that it has not been purposely or unconsciously manipulated. Every effort has been made to obtain as much relevant data as possible for this evaluation. It is important to note that the conclusions that lead to this procedure are derived in large part from the available data. Always take into account that the treatment will also be dependent on availability of resources and existing treatment guidelines, considered by other Pain Management Practitioners as being common knowledge and practice, at the time of the intervention. For Medico-Legal purposes, it is also important to point out that variation in procedural techniques and pharmacological choices are the acceptable norm. The indications, contraindications, technique, and results of the above procedure should only be interpreted and judged by a  Board-Certified Interventional Pain Specialist with extensive familiarity and expertise in the same exact procedure and technique.

## 2016-12-12 NOTE — Patient Instructions (Signed)

## 2016-12-25 DIAGNOSIS — R69 Illness, unspecified: Secondary | ICD-10-CM | POA: Diagnosis not present

## 2016-12-27 ENCOUNTER — Telehealth: Payer: Self-pay | Admitting: Student in an Organized Health Care Education/Training Program

## 2016-12-27 ENCOUNTER — Ambulatory Visit
Payer: Medicare HMO | Attending: Student in an Organized Health Care Education/Training Program | Admitting: Student in an Organized Health Care Education/Training Program

## 2016-12-27 ENCOUNTER — Encounter: Payer: Self-pay | Admitting: Student in an Organized Health Care Education/Training Program

## 2016-12-27 VITALS — BP 121/65 | HR 71 | Temp 98.3°F | Resp 16 | Ht 68.0 in | Wt 193.0 lb

## 2016-12-27 DIAGNOSIS — Z79899 Other long term (current) drug therapy: Secondary | ICD-10-CM | POA: Diagnosis not present

## 2016-12-27 DIAGNOSIS — E1122 Type 2 diabetes mellitus with diabetic chronic kidney disease: Secondary | ICD-10-CM | POA: Insufficient documentation

## 2016-12-27 DIAGNOSIS — Z8546 Personal history of malignant neoplasm of prostate: Secondary | ICD-10-CM | POA: Diagnosis not present

## 2016-12-27 DIAGNOSIS — D638 Anemia in other chronic diseases classified elsewhere: Secondary | ICD-10-CM | POA: Insufficient documentation

## 2016-12-27 DIAGNOSIS — Z7982 Long term (current) use of aspirin: Secondary | ICD-10-CM | POA: Diagnosis not present

## 2016-12-27 DIAGNOSIS — N183 Chronic kidney disease, stage 3 (moderate): Secondary | ICD-10-CM | POA: Insufficient documentation

## 2016-12-27 DIAGNOSIS — M5416 Radiculopathy, lumbar region: Secondary | ICD-10-CM | POA: Diagnosis not present

## 2016-12-27 DIAGNOSIS — M5116 Intervertebral disc disorders with radiculopathy, lumbar region: Secondary | ICD-10-CM | POA: Insufficient documentation

## 2016-12-27 DIAGNOSIS — E7800 Pure hypercholesterolemia, unspecified: Secondary | ICD-10-CM | POA: Insufficient documentation

## 2016-12-27 DIAGNOSIS — I130 Hypertensive heart and chronic kidney disease with heart failure and stage 1 through stage 4 chronic kidney disease, or unspecified chronic kidney disease: Secondary | ICD-10-CM | POA: Insufficient documentation

## 2016-12-27 DIAGNOSIS — Z794 Long term (current) use of insulin: Secondary | ICD-10-CM | POA: Insufficient documentation

## 2016-12-27 DIAGNOSIS — Z79891 Long term (current) use of opiate analgesic: Secondary | ICD-10-CM | POA: Diagnosis not present

## 2016-12-27 DIAGNOSIS — M48062 Spinal stenosis, lumbar region with neurogenic claudication: Secondary | ICD-10-CM | POA: Insufficient documentation

## 2016-12-27 DIAGNOSIS — M5136 Other intervertebral disc degeneration, lumbar region: Secondary | ICD-10-CM

## 2016-12-27 DIAGNOSIS — M9983 Other biomechanical lesions of lumbar region: Secondary | ICD-10-CM | POA: Diagnosis not present

## 2016-12-27 DIAGNOSIS — I5032 Chronic diastolic (congestive) heart failure: Secondary | ICD-10-CM | POA: Diagnosis not present

## 2016-12-27 DIAGNOSIS — G894 Chronic pain syndrome: Secondary | ICD-10-CM | POA: Diagnosis not present

## 2016-12-27 DIAGNOSIS — M48061 Spinal stenosis, lumbar region without neurogenic claudication: Secondary | ICD-10-CM

## 2016-12-27 DIAGNOSIS — M549 Dorsalgia, unspecified: Secondary | ICD-10-CM | POA: Insufficient documentation

## 2016-12-27 DIAGNOSIS — K219 Gastro-esophageal reflux disease without esophagitis: Secondary | ICD-10-CM | POA: Insufficient documentation

## 2016-12-27 DIAGNOSIS — Z86718 Personal history of other venous thrombosis and embolism: Secondary | ICD-10-CM | POA: Insufficient documentation

## 2016-12-27 NOTE — Progress Notes (Signed)
Safety precautions to be maintained throughout the outpatient stay will include: orient to surroundings, keep bed in low position, maintain call bell within reach at all times, provide assistance with transfer out of bed and ambulation.  

## 2016-12-27 NOTE — Patient Instructions (Addendum)
1. Follow up for caudal ESI GENERAL RISKS AND COMPLICATIONS  What are the risk, side effects and possible complications? Generally speaking, most procedures are safe.  However, with any procedure there are risks, side effects, and the possibility of complications.  The risks and complications are dependent upon the sites that are lesioned, or the type of nerve block to be performed.  The closer the procedure is to the spine, the more serious the risks are.  Great care is taken when placing the radio frequency needles, block needles or lesioning probes, but sometimes complications can occur. 1. Infection: Any time there is an injection through the skin, there is a risk of infection.  This is why sterile conditions are used for these blocks.  There are four possible types of infection. 1. Localized skin infection. 2. Central Nervous System Infection-This can be in the form of Meningitis, which can be deadly. 3. Epidural Infections-This can be in the form of an epidural abscess, which can cause pressure inside of the spine, causing compression of the spinal cord with subsequent paralysis. This would require an emergency surgery to decompress, and there are no guarantees that the patient would recover from the paralysis. 4. Discitis-This is an infection of the intervertebral discs.  It occurs in about 1% of discography procedures.  It is difficult to treat and it may lead to surgery.        2. Pain: the needles have to go through skin and soft tissues, will cause soreness.       3. Damage to internal structures:  The nerves to be lesioned may be near blood vessels or    other nerves which can be potentially damaged.       4. Bleeding: Bleeding is more common if the patient is taking blood thinners such as  aspirin, Coumadin, Ticiid, Plavix, etc., or if he/she have some genetic predisposition  such as hemophilia. Bleeding into the spinal canal can cause compression of the spinal  cord with subsequent  paralysis.  This would require an emergency surgery to  decompress and there are no guarantees that the patient would recover from the  paralysis.       5. Pneumothorax:  Puncturing of a lung is a possibility, every time a needle is introduced in  the area of the chest or upper back.  Pneumothorax refers to free air around the  collapsed lung(s), inside of the thoracic cavity (chest cavity).  Another two possible  complications related to a similar event would include: Hemothorax and Chylothorax.   These are variations of the Pneumothorax, where instead of air around the collapsed  lung(s), you may have blood or chyle, respectively.       6. Spinal headaches: They may occur with any procedures in the area of the spine.       7. Persistent CSF (Cerebro-Spinal Fluid) leakage: This is a rare problem, but may occur  with prolonged intrathecal or epidural catheters either due to the formation of a fistulous  track or a dural tear.       8. Nerve damage: By working so close to the spinal cord, there is always a possibility of  nerve damage, which could be as serious as a permanent spinal cord injury with  paralysis.       9. Death:  Although rare, severe deadly allergic reactions known as "Anaphylactic  reaction" can occur to any of the medications used.      10. Worsening of the symptoms:  We can always make thing worse.  What are the chances of something like this happening? Chances of any of this occuring are extremely low.  By statistics, you have more of a chance of getting killed in a motor vehicle accident: while driving to the hospital than any of the above occurring .  Nevertheless, you should be aware that they are possibilities.  In general, it is similar to taking a shower.  Everybody knows that you can slip, hit your head and get killed.  Does that mean that you should not shower again?  Nevertheless always keep in mind that statistics do not mean anything if you happen to be on the wrong side of  them.  Even if a procedure has a 1 (one) in a 1,000,000 (million) chance of going wrong, it you happen to be that one..Also, keep in mind that by statistics, you have more of a chance of having something go wrong when taking medications.  Who should not have this procedure? If you are on a blood thinning medication (e.g. Coumadin, Plavix, see list of "Blood Thinners"), or if you have an active infection going on, you should not have the procedure.  If you are taking any blood thinners, please inform your physician.  How should I prepare for this procedure?  Do not eat or drink anything at least six hours prior to the procedure.  Bring a driver with you .  It cannot be a taxi.  Come accompanied by an adult that can drive you back, and that is strong enough to help you if your legs get weak or numb from the local anesthetic.  Take all of your medicines the morning of the procedure with just enough water to swallow them.  If you have diabetes, make sure that you are scheduled to have your procedure done first thing in the morning, whenever possible.  If you have diabetes, take only half of your insulin dose and notify our nurse that you have done so as soon as you arrive at the clinic.  If you are diabetic, but only take blood sugar pills (oral hypoglycemic), then do not take them on the morning of your procedure.  You may take them after you have had the procedure.  Do not take aspirin or any aspirin-containing medications, at least eleven (11) days prior to the procedure.  They may prolong bleeding.  Wear loose fitting clothing that may be easy to take off and that you would not mind if it got stained with Betadine or blood.  Do not wear any jewelry or perfume  Remove any nail coloring.  It will interfere with some of our monitoring equipment.  NOTE: Remember that this is not meant to be interpreted as a complete list of all possible complications.  Unforeseen problems may occur.  BLOOD  THINNERS The following drugs contain aspirin or other products, which can cause increased bleeding during surgery and should not be taken for 2 weeks prior to and 1 week after surgery.  If you should need take something for relief of minor pain, you may take acetaminophen which is found in Tylenol,m Datril, Anacin-3 and Panadol. It is not blood thinner. The products listed below are.  Do not take any of the products listed below in addition to any listed on your instruction sheet.  A.P.C or A.P.C with Codeine Codeine Phosphate Capsules #3 Ibuprofen Ridaura  ABC compound Congesprin Imuran rimadil  Advil Cope Indocin Robaxisal  Alka-Seltzer Effervescent Pain Reliever and Antacid  Coricidin or Coricidin-D  Indomethacin Rufen  Alka-Seltzer plus Cold Medicine Cosprin Ketoprofen S-A-C Tablets  Anacin Analgesic Tablets or Capsules Coumadin Korlgesic Salflex  Anacin Extra Strength Analgesic tablets or capsules CP-2 Tablets Lanoril Salicylate  Anaprox Cuprimine Capsules Levenox Salocol  Anexsia-D Dalteparin Magan Salsalate  Anodynos Darvon compound Magnesium Salicylate Sine-off  Ansaid Dasin Capsules Magsal Sodium Salicylate  Anturane Depen Capsules Marnal Soma  APF Arthritis pain formula Dewitt's Pills Measurin Stanback  Argesic Dia-Gesic Meclofenamic Sulfinpyrazone  Arthritis Bayer Timed Release Aspirin Diclofenac Meclomen Sulindac  Arthritis pain formula Anacin Dicumarol Medipren Supac  Analgesic (Safety coated) Arthralgen Diffunasal Mefanamic Suprofen  Arthritis Strength Bufferin Dihydrocodeine Mepro Compound Suprol  Arthropan liquid Dopirydamole Methcarbomol with Aspirin Synalgos  ASA tablets/Enseals Disalcid Micrainin Tagament  Ascriptin Doan's Midol Talwin  Ascriptin A/D Dolene Mobidin Tanderil  Ascriptin Extra Strength Dolobid Moblgesic Ticlid  Ascriptin with Codeine Doloprin or Doloprin with Codeine Momentum Tolectin  Asperbuf Duoprin Mono-gesic Trendar  Aspergum Duradyne Motrin or Motrin  IB Triminicin  Aspirin plain, buffered or enteric coated Durasal Myochrisine Trigesic  Aspirin Suppositories Easprin Nalfon Trillsate  Aspirin with Codeine Ecotrin Regular or Extra Strength Naprosyn Uracel  Atromid-S Efficin Naproxen Ursinus  Auranofin Capsules Elmiron Neocylate Vanquish  Axotal Emagrin Norgesic Verin  Azathioprine Empirin or Empirin with Codeine Normiflo Vitamin E  Azolid Emprazil Nuprin Voltaren  Bayer Aspirin plain, buffered or children's or timed BC Tablets or powders Encaprin Orgaran Warfarin Sodium  Buff-a-Comp Enoxaparin Orudis Zorpin  Buff-a-Comp with Codeine Equegesic Os-Cal-Gesic   Buffaprin Excedrin plain, buffered or Extra Strength Oxalid   Bufferin Arthritis Strength Feldene Oxphenbutazone   Bufferin plain or Extra Strength Feldene Capsules Oxycodone with Aspirin   Bufferin with Codeine Fenoprofen Fenoprofen Pabalate or Pabalate-SF   Buffets II Flogesic Panagesic   Buffinol plain or Extra Strength Florinal or Florinal with Codeine Panwarfarin   Buf-Tabs Flurbiprofen Penicillamine   Butalbital Compound Four-way cold tablets Penicillin   Butazolidin Fragmin Pepto-Bismol   Carbenicillin Geminisyn Percodan   Carna Arthritis Reliever Geopen Persantine   Carprofen Gold's salt Persistin   Chloramphenicol Goody's Phenylbutazone   Chloromycetin Haltrain Piroxlcam   Clmetidine heparin Plaquenil   Cllnoril Hyco-pap Ponstel   Clofibrate Hydroxy chloroquine Propoxyphen         Before stopping any of these medications, be sure to consult the physician who ordered them.  Some, such as Coumadin (Warfarin) are ordered to prevent or treat serious conditions such as "deep thrombosis", "pumonary embolisms", and other heart problems.  The amount of time that you may need off of the medication may also vary with the medication and the reason for which you were taking it.  If you are taking any of these medications, please make sure you notify your pain physician before you  undergo any procedures.          Epidural Steroid Injection An epidural steroid injection is a shot of steroid medicine and numbing medicine that is given into the space between the spinal cord and the bones in your back (epidural space). The shot helps relieve pain caused by an irritated or swollen nerve root. The amount of pain relief you get from the injection depends on what is causing the nerve to be swollen and irritated, and how long your pain lasts. You are more likely to benefit from this injection if your pain is strong and comes on suddenly rather than if you have had pain for a long time. Tell a health care provider about:  Any allergies you have.  All medicines you are taking, including vitamins, herbs, eye drops, creams, and over-the-counter medicines.  Any problems you or family members have had with anesthetic medicines.  Any blood disorders you have.  Any surgeries you have had.  Any medical conditions you have.  Whether you are pregnant or may be pregnant. What are the risks? Generally, this is a safe procedure. However, problems may occur, including:  Headache.  Bleeding.  Infection.  Allergic reaction to medicines.  Damage to your nerves.  What happens before the procedure? Staying hydrated Follow instructions from your health care provider about hydration, which may include:  Up to 2 hours before the procedure - you may continue to drink clear liquids, such as water, clear fruit juice, black coffee, and plain tea.  Eating and drinking restrictions Follow instructions from your health care provider about eating and drinking, which may include:  8 hours before the procedure - stop eating heavy meals or foods such as meat, fried foods, or fatty foods.  6 hours before the procedure - stop eating light meals or foods, such as toast or cereal.  6 hours before the procedure - stop drinking milk or drinks that contain milk.  2 hours before the  procedure - stop drinking clear liquids.  Medicine  You may be given medicines to lower anxiety.  Ask your health care provider about: ? Changing or stopping your regular medicines. This is especially important if you are taking diabetes medicines or blood thinners. ? Taking medicines such as aspirin and ibuprofen. These medicines can thin your blood. Do not take these medicines before your procedure if your health care provider instructs you not to. General instructions  Plan to have someone take you home from the hospital or clinic. What happens during the procedure?  You may receive a medicine to help you relax (sedative).  You will be asked to lie on your abdomen.  The injection site will be cleaned.  A numbing medicine (local anesthetic) will be used to numb the injection site.  A needle will be inserted through your skin into the epidural space. You may feel some discomfort when this happens. An X-ray machine will be used to make sure the needle is put as close as possible to the affected nerve.  A steroid medicine and a local anesthetic will be injected into the epidural space.  The needle will be removed.  A bandage (dressing) will be put over the injection site. What happens after the procedure?  Your blood pressure, heart rate, breathing rate, and blood oxygen level will be monitored until the medicines you were given have worn off.  Your arm or leg may feel weak or numb for a few hours.  The injection site may feel sore.  Do not drive for 24 hours if you received a sedative. This information is not intended to replace advice given to you by your health care provider. Make sure you discuss any questions you have with your health care provider. Document Released: 05/31/2007 Document Revised: 08/05/2015 Document Reviewed: 06/09/2015 [employer] Interactive Patient Education  2017 [employer].

## 2016-12-27 NOTE — Progress Notes (Signed)
Patient's Name: Jesse Dickson  MRN: [id number]  Referring Provider: Cletis Athens, MD  DOB: 08/23/1940  PCP: Cletis Athens, MD  DOS: 12/27/2016  Note by: Gillis Santa, MD  Service setting: Ambulatory outpatient  Specialty: Interventional Pain Management  Location: [facility] (AMB) Pain Management Facility    Patient type: Established   Primary Reason(s) for Visit: Encounter for post-procedure evaluation of chronic illness with mild to moderate exacerbation CC: Back Pain (low)  HPI  Jesse Dickson is a 76 y.o. year old, male patient, who comes today for a post-procedure evaluation. He has Anemia of chronic disease; Chronic diastolic congestive heart failure ([place]); Chronic low back pain; CKD (chronic kidney disease) stage 3, GFR 30-59 ml/min (HCC); GERD without esophagitis; Insulin dependent diabetes mellitus ([place]); and Pure hypercholesterolemia on his problem list. His primarily concern today is the Back Pain (low)  Pain Assessment: Location: Lower Back Radiating: radiates down left leg to toes in the back Onset: More than a month ago Duration: Chronic pain Quality: Aching, Radiating, Stabbing Severity: 8 /10 (self-reported pain score)  Note: Reported level is inconsistent with clinical observations. Clinically the patient looks like a 5/10 Jesse Dickson does not seem to understand the use of our objective pain scale When using our objective Pain Scale, levels between 6 and 10/10 are said to belong in an emergency room, as it progressively worsens from a 6/10, described as severely limiting, requiring emergency care not usually available at an outpatient pain management facility. At a 6/10 level, communication becomes difficult and requires great effort. Assistance to reach the emergency department may be required. Facial flushing and profuse sweating along with potentially dangerous increases in heart rate and blood pressure will be evident. Effect on ADL: pain when walking Timing:  Constant Modifying factors: sitting  Jesse Dickson comes in today for post-procedure evaluation after the treatment done on 12/12/2016.  Further details on both, my assessment(s), as well as the proposed treatment plan, please see below.  Post-Procedure Assessment  12/12/2016 Procedure: Left L5-S1 ESI Pre-procedure pain score:  5/10 Post-procedure pain score: 0/10         Influential Factors: BMI: 29.35 kg/m Intra-procedural challenges: None observed.         Assessment challenges: None detected.              Reported side-effects: None.        Post-procedural adverse reactions or complications: None reported         Sedation: Please see nurses note. When no sedatives are used, the analgesic levels obtained are directly associated to the effectiveness of the local anesthetics. However, when sedation is provided, the level of analgesia obtained during the initial 1 hour following the intervention, is believed to be the result of a combination of factors. These factors may include, but are not limited to: 1. The effectiveness of the local anesthetics used. 2. The effects of the analgesic(s) and/or anxiolytic(s) used. 3. The degree of discomfort experienced by the patient at the time of the procedure. 4. The patients ability and reliability in recalling and recording the events. 5. The presence and influence of possible secondary gains and/or psychosocial factors. Reported result: Relief experienced during the 1st hour after the procedure: 0 % (Ultra-Short Term Relief)              Effects of local anesthetic: The analgesic effects attained during this period are directly associated to the localized infiltration of local anesthetics and therefore cary significant diagnostic value as to the etiological location, or  anatomical origin, of the pain. Expected duration of relief is directly dependent on the pharmacodynamics of the local anesthetic used. Long-acting (4-6 hours) anesthetics used.   Reported result: Relief during the next 4 to 6 hour after the procedure: 0 % (Short-Term Relief)              Long-term benefit: Defined as the period of time past the expected duration of local anesthetics (1 hour for short-acting and 4-6 hours for long-acting). With the possible exception of prolonged sympathetic blockade from the local anesthetics, benefits during this period are typically attributed to, or associated with, other factors such as analgesic sensory neuropraxia, antiinflammatory effects, or beneficial biochemical changes provided by agents other than the local anesthetics.  Reported result: Extended relief following procedure: 0 % (Long-Term Relief)              Current benefits: Defined as reported results that persistent at this point in time.   Analgesia: 0-25 %            Function: Somewhat improved ROM: Somewhat improved Interpretative annotation: Recurrence of symptoms. No permanent benefit expected.   Interpretation: Results would suggest Patient follows up for postprocedure evaluation status post left L5-S1 epidural steroid injection 12/12/2016. After entering into the epidural space and as we were injecting medicine, patient had a severe cramp of his left buttock region for his safety epidural needle was removed given patient movement. Approximately 3 cc was injected into the epidural space likely explaining his suboptimal results.                  Plan:  Please see "Plan of Care" for details.       Laboratory Chemistry  Inflammation Markers (CRP: Acute Phase) (ESR: Chronic Phase) No results found for: CRP, ESRSEDRATE               Renal Function Markers Lab Results  Component Value Date   BUN 36 (H) 11/22/2016   CREATININE 1.95 (H) 11/22/2016   GFRAA 38 (L) 11/22/2016   GFRNONAA 32 (L) 11/22/2016                 Hepatic Function Markers Lab Results  Component Value Date   AST 19 11/22/2016   ALT 22 11/22/2016   ALBUMIN 3.8 11/22/2016   ALKPHOS 80  11/22/2016                 Electrolytes Lab Results  Component Value Date   NA 137 11/22/2016   K 4.6 11/22/2016   CL 102 11/22/2016   CALCIUM 9.5 11/22/2016                 Neuropathy Markers Lab Results  Component Value Date   VITAMINB12 570 09/13/2016                 Bone Pathology Markers Lab Results  Component Value Date   ALKPHOS 80 11/22/2016   CALCIUM 9.5 11/22/2016                 Coagulation Parameters Lab Results  Component Value Date   INR 1.11 10/11/2016   LABPROT 14.4 10/11/2016   APTT 31 10/11/2016   PLT 201 11/22/2016                 Cardiovascular Markers Lab Results  Component Value Date   BNP 234.0 (H) 11/22/2016   HGB 9.6 (L) 11/22/2016   HCT 28.8 (L) 11/22/2016  Note: Lab results reviewed.  Recent Diagnostic Imaging Results  DG C-Arm 1-60 Min-No Report Fluoroscopy was utilized by the requesting physician.  No radiographic  interpretation.   Complexity Note: Imaging results reviewed. Results shared with Mr. Villarin, using Layman's terms.                         Meds   Current Outpatient Prescriptions:  .  aspirin EC 81 MG tablet, Take by mouth., Disp: , Rfl:  .  atorvastatin (LIPITOR) 20 MG tablet, Take 20 mg by mouth daily., Disp: , Rfl:  .  calcitRIOL (ROCALTROL) 0.25 MCG capsule, Take 0.25 mcg by mouth daily., Disp: , Rfl: 6 .  carvedilol (COREG) 25 MG tablet, Take 25 mg by mouth 2 (two) times daily., Disp: , Rfl:  .  docusate sodium (COLACE) 100 MG capsule, Take 100 mg by mouth 2 (two) times daily., Disp: , Rfl:  .  famotidine (PEPCID) 20 MG tablet, Take by mouth., Disp: , Rfl:  .  furosemide (LASIX) 40 MG tablet, Take 40 mg by mouth 2 (two) times daily., Disp: , Rfl:  .  gabapentin (NEURONTIN) 100 MG capsule, Take 200 mg by mouth daily., Disp: , Rfl: 2 .  insulin glargine (LANTUS) 100 UNIT/ML injection, Inject 25 Units into the skin at bedtime. , Disp: , Rfl:  .  Insulin Syringe-Needle U-100 (INSULIN SYRINGE  1CC/30GX5/16") 30G X 5/16" 1 ML MISC, Use 1 Dose 4 (four) times daily., Disp: , Rfl:  .  polyethylene glycol (MIRALAX / GLYCOLAX) packet, Take by mouth., Disp: , Rfl:  .  tamsulosin (FLOMAX) 0.4 MG CAPS capsule, Take 0.4 mg by mouth at bedtime., Disp: , Rfl:  .  traMADol (ULTRAM) 50 MG tablet, Take 1 tablet (50 mg total) by mouth every 6 (six) hours as needed., Disp: 20 tablet, Rfl: 0 .  aspirin EC 81 MG tablet, Take by mouth., Disp: , Rfl:  .  atorvastatin (LIPITOR) 20 MG tablet, Take by mouth., Disp: , Rfl:  .  carvedilol (COREG) 25 MG tablet, Take by mouth., Disp: , Rfl:  .  famotidine (PEPCID) 20 MG tablet, Take by mouth., Disp: , Rfl:  .  furosemide (LASIX) 40 MG tablet, Take by mouth., Disp: , Rfl:  .  polyethylene glycol (MIRALAX / GLYCOLAX) packet, Take by mouth., Disp: , Rfl:   ROS  Constitutional: Denies any fever or chills Gastrointestinal: No reported hemesis, hematochezia, vomiting, or acute GI distress Musculoskeletal: Denies any acute onset joint swelling, redness, loss of ROM, or weakness Neurological: No reported episodes of acute onset apraxia, aphasia, dysarthria, agnosia, amnesia, paralysis, loss of coordination, or loss of consciousness  Allergies  Mr. Fridman has No Known Allergies.  [place]  Drug: Mr. Capraro  reports that he does not use drugs. Alcohol:  reports that he drinks alcohol. Tobacco:  reports that he has never smoked. He has never used smokeless tobacco. Medical:  has a past medical history of CHF (congestive heart failure) ([place]); Diabetes mellitus without complication ([place]); DVT (deep venous thrombosis) ([place]); GERD (gastroesophageal reflux disease); Hypercholesteremia; Hypertension; and Prostate CA ([place]) (2015). Surgical: Mr. Blankenship  has a past surgical history that includes Prostatectomy. Family: family history includes COPD in his mother; Cancer in his sister and sister; Dementia in his sister; Diabetes in his mother.  Constitutional Exam  General  appearance: Well nourished, well developed, and well hydrated. In no apparent acute distress Vitals:   12/27/16 1124 12/27/16 1126  BP:  121/65  Pulse: 71   Resp: 16   Temp: 98.3 F (36.8 C)   SpO2: 100%   Weight: 193 lb (87.5 kg)   Height: '5\' 8"'$  (1.727 m)    BMI Assessment: Estimated body mass index is 29.35 kg/m as calculated from the following:   Height as of this encounter: '5\' 8"'$  (1.727 m).   Weight as of this encounter: 193 lb (87.5 kg).  BMI interpretation table: BMI level Category Range association with higher incidence of chronic pain  <18 kg/m2 Underweight   18.5-24.9 kg/m2 Ideal body weight   25-29.9 kg/m2 Overweight Increased incidence by 20%  30-34.9 kg/m2 Obese (Class I) Increased incidence by 68%  35-39.9 kg/m2 Severe obesity (Class II) Increased incidence by 136%  >40 kg/m2 Extreme obesity (Class III) Increased incidence by 254%   BMI Readings from Last 4 Encounters:  12/27/16 29.35 kg/m  12/12/16 29.35 kg/m  11/30/16 28.89 kg/m  11/29/16 28.94 kg/m   Wt Readings from Last 4 Encounters:  12/27/16 193 lb (87.5 kg)  12/12/16 193 lb (87.5 kg)  11/30/16 190 lb (86.2 kg)  11/29/16 190 lb 4.8 oz (86.3 kg)  Psych/Mental status: Alert, oriented x 3 (person, place, & time)       Eyes: PERLA Respiratory: No evidence of acute respiratory distress  Cervical Spine Area Exam  Skin & Axial Inspection: No masses, redness, edema, swelling, or associated skin lesions Alignment: Symmetrical Functional ROM: Unrestricted ROM      Stability: No instability detected Muscle Tone/Strength: Functionally intact. No obvious neuro-muscular anomalies detected. Sensory (Neurological): Unimpaired Palpation: No palpable anomalies              Upper Extremity (UE) Exam    Side: Right upper extremity  Side: Left upper extremity  Skin & Extremity Inspection: Skin color, temperature, and hair growth are WNL. No peripheral edema or cyanosis. No masses, redness, swelling, asymmetry,  or associated skin lesions. No contractures.  Skin & Extremity Inspection: Skin color, temperature, and hair growth are WNL. No peripheral edema or cyanosis. No masses, redness, swelling, asymmetry, or associated skin lesions. No contractures.  Functional ROM: Unrestricted ROM          Functional ROM: Unrestricted ROM          Muscle Tone/Strength: Functionally intact. No obvious neuro-muscular anomalies detected.  Muscle Tone/Strength: Functionally intact. No obvious neuro-muscular anomalies detected.  Sensory (Neurological): Unimpaired          Sensory (Neurological): Unimpaired          Palpation: No palpable anomalies              Palpation: No palpable anomalies              Specialized Test(s): Deferred         Specialized Test(s): Deferred          Thoracic Spine Area Exam  Skin & Axial Inspection: No masses, redness, or swelling Alignment: Symmetrical Functional ROM: Unrestricted ROM Stability: No instability detected Muscle Tone/Strength: Functionally intact. No obvious neuro-muscular anomalies detected. Sensory (Neurological): Unimpaired Muscle strength & Tone: No palpable anomalies   Lumbar Spine Area Exam  Skin & Axial Inspection: No masses, redness, or swelling Alignment: Asymmetric Functional ROM: Decreased ROM, bilaterallyseverely limited lumbar extension Stability: No instability detected Muscle Tone/Strength: Functionally intact. No obvious neuro-muscular anomalies detected. Sensory (Neurological): Dermatomal pain pattern and musculoskeletal referral pattern Palpation: Complains of area being tender to palpation Bilateral Fist Percussion Test Provocative Tests: Lumbar Hyperextension and rotation test: Positive bilaterally for  facet joint pain.left greater than right Lumbar Lateral bending test: Positive ipsilateral radicular pain, bilaterally. Positive for bilateral foraminal stenosis. Patrick's Maneuver: evaluation deferred today                   Positive straight leg  raise test bilaterally, left greater than right.  Gait & Posture Assessment  Ambulation: Limited using cane Gait: Age-related, senile gait pattern Posture: WNL     Lower Extremity Exam    Side: Right lower extremity  Side: Left lower extremity  Skin & Extremity Inspection: Skin color, temperature, and hair growth are WNL. No peripheral edema or cyanosis. No masses, redness, swelling, asymmetry, or associated skin lesions. No contractures.  Skin & Extremity Inspection: Skin color, temperature, and hair growth are WNL. No peripheral edema or cyanosis. No masses, redness, swelling, asymmetry, or associated skin lesions. No contractures.  Functional ROM: Unrestricted ROM          Functional ROM: Unrestricted ROM          Muscle Tone/Strength: Functionally intact. No obvious neuro-muscular anomalies detected.  Muscle Tone/Strength: Functionally intact. No obvious neuro-muscular anomalies detected.  Sensory (Neurological): Unimpaired  Sensory (Neurological): Unimpaired  Palpation: No palpable anomalies  Palpation: No palpable anomalies   Assessment  Primary Diagnosis & Pertinent Problem List: The primary encounter diagnosis was Lumbar radiculopathy. Diagnoses of Lumbar degenerative disc disease, Neuroforaminal stenosis of lumbar spine, Spinal stenosis, lumbar region, with neurogenic claudication, and Chronic pain syndrome were also pertinent to this visit.  Status Diagnosis  Persistent Persistent Persistent 1. Lumbar radiculopathy   2. Lumbar degenerative disc disease   3. Neuroforaminal stenosis of lumbar spine   4. Spinal stenosis, lumbar region, with neurogenic claudication   5. Chronic pain syndrome       General Recommendations: The pain condition that the patient suffers from is best treated with a multidisciplinary approach that involves an increase in physical activity to prevent de-conditioning and worsening of the pain cycle, as well as psychological counseling (formal and/or  informal) to address the co-morbid psychological affects of pain. Treatment will often involve judicious use of pain medications and interventional procedures to decrease the pain, allowing the patient to participate in the physical activity that will ultimately produce long-lasting pain reductions. The goal of the multidisciplinary approach is to return the patient to a higher level of overall function and to restore their ability to perform activities of daily living.  76 year old male with a past medical history of CHF, type 2 diabetes on insulin, history of DVT, GERD, hypertension, history of prostate cancer status post prostatectomy, stage III, chronic kidney disease who presents with low back pain that radiates into bilateral legs secondary to lumbar and neuroforaminal stenosis secondary to disc bulge most pronounced at L3-L4, left greater than right. Physical exam is consistent with lumbosacral radiculopathy given positive straight leg raise test and lateral bending were positive for neuroforaminal disease and radicular disease.  Patient follows up for postprocedure evaluation status post left L5-S1 epidural steroid injection 12/12/2016. After entering into the epidural space and as we were injecting medicine, patient had a severe cramp of his left buttock and left thigh region and subsequently for his safety epidural needle was removed given patient movement. Approximately 3 cc was injected into the epidural space likely explaining his suboptimal results.  Patient wants to repeat epidural steroid injection with hopes of getting more volume into epidural space. I think this is reasonable. We'll plan for caudal approach to maximize the amount of volume that  we can deliver. Patient has an appointment with physical therapy scheduled at the end of this week.  Plan: -Continue with physical therapy -Schedule for caudal epidural steroid injection.   Future: -Sacroiliac joint injection left -Bilateral  lumbar medial branch nerve blocks L3-L5. Orders Placed This Encounter  Procedures  . Caudal Epidural Injection    Standing Status:   Future    Standing Expiration Date:   01/27/2017    Scheduling Instructions:     Laterality: Left-sided     Level(s): Sacrococcygeal canal (Tailbone area)     Sedation: no sedation     ASAP    Order Specific Question:   Where will this procedure be performed?    Answer:   [facility]    Provider-requested follow-up: Return in about 1 week (around 01/03/2017) for Procedure.  Future Appointments Date Time Provider [place]  02/21/2017 2:00 PM [facility] None  05/30/2017 1:45 PM [facility] None  05/30/2017 2:00 PM Sindy Guadeloupe, MD [facility] None    Primary Care Physician: Cletis Athens, MD Location: [facility] Note by: Gillis Santa, M.D Date: 12/27/2016; Time: 1:14 PM  Patient Instructions   1. Follow up for caudal ESI GENERAL RISKS AND COMPLICATIONS  What are the risk, side effects and possible complications? Generally speaking, most procedures are safe.  However, with any procedure there are risks, side effects, and the possibility of complications.  The risks and complications are dependent upon the sites that are lesioned, or the type of nerve block to be performed.  The closer the procedure is to the spine, the more serious the risks are.  Great care is taken when placing the radio frequency needles, block needles or lesioning probes, but sometimes complications can occur. 1. Infection: Any time there is an injection through the skin, there is a risk of infection.  This is why sterile conditions are used for these blocks.  There are four possible types of infection. 1. Localized skin infection. 2. Central Nervous System Infection-This can be in the form of Meningitis, which can be deadly. 3. Epidural Infections-This can be in the form of an epidural abscess, which can  cause pressure inside of the spine, causing compression of the spinal cord with subsequent paralysis. This would require an emergency surgery to decompress, and there are no guarantees that the patient would recover from the paralysis. 4. Discitis-This is an infection of the intervertebral discs.  It occurs in about 1% of discography procedures.  It is difficult to treat and it may lead to surgery.        2. Pain: the needles have to go through skin and soft tissues, will cause soreness.       3. Damage to internal structures:  The nerves to be lesioned may be near blood vessels or    other nerves which can be potentially damaged.       4. Bleeding: Bleeding is more common if the patient is taking blood thinners such as  aspirin, Coumadin, Ticiid, Plavix, etc., or if he/she have some genetic predisposition  such as hemophilia. Bleeding into the spinal canal can cause compression of the spinal  cord with subsequent paralysis.  This would require an emergency surgery to  decompress and there are no guarantees that the patient would recover from the  paralysis.       5. Pneumothorax:  Puncturing of a lung is a possibility, every time a needle is introduced in  the area  of the chest or upper back.  Pneumothorax refers to free air around the  collapsed lung(s), inside of the thoracic cavity (chest cavity).  Another two possible  complications related to a similar event would include: Hemothorax and Chylothorax.   These are variations of the Pneumothorax, where instead of air around the collapsed  lung(s), you may have blood or chyle, respectively.       6. Spinal headaches: They may occur with any procedures in the area of the spine.       7. Persistent CSF (Cerebro-Spinal Fluid) leakage: This is a rare problem, but may occur  with prolonged intrathecal or epidural catheters either due to the formation of a fistulous  track or a dural tear.       8. Nerve damage: By working so close to the spinal cord, there is  always a possibility of  nerve damage, which could be as serious as a permanent spinal cord injury with  paralysis.       9. Death:  Although rare, severe deadly allergic reactions known as "Anaphylactic  reaction" can occur to any of the medications used.      10. Worsening of the symptoms:  We can always make thing worse.  What are the chances of something like this happening? Chances of any of this occuring are extremely low.  By statistics, you have more of a chance of getting killed in a motor vehicle accident: while driving to the hospital than any of the above occurring .  Nevertheless, you should be aware that they are possibilities.  In general, it is similar to taking a shower.  Everybody knows that you can slip, hit your head and get killed.  Does that mean that you should not shower again?  Nevertheless always keep in mind that statistics do not mean anything if you happen to be on the wrong side of them.  Even if a procedure has a 1 (one) in a 1,000,000 (million) chance of going wrong, it you happen to be that one..Also, keep in mind that by statistics, you have more of a chance of having something go wrong when taking medications.  Who should not have this procedure? If you are on a blood thinning medication (e.g. Coumadin, Plavix, see list of "Blood Thinners"), or if you have an active infection going on, you should not have the procedure.  If you are taking any blood thinners, please inform your physician.  How should I prepare for this procedure?  Do not eat or drink anything at least six hours prior to the procedure.  Bring a driver with you .  It cannot be a taxi.  Come accompanied by an adult that can drive you back, and that is strong enough to help you if your legs get weak or numb from the local anesthetic.  Take all of your medicines the morning of the procedure with just enough water to swallow them.  If you have diabetes, make sure that you are scheduled to have your  procedure done first thing in the morning, whenever possible.  If you have diabetes, take only half of your insulin dose and notify our nurse that you have done so as soon as you arrive at the clinic.  If you are diabetic, but only take blood sugar pills (oral hypoglycemic), then do not take them on the morning of your procedure.  You may take them after you have had the procedure.  Do not take aspirin or any aspirin-containing  medications, at least eleven (11) days prior to the procedure.  They may prolong bleeding.  Wear loose fitting clothing that may be easy to take off and that you would not mind if it got stained with Betadine or blood.  Do not wear any jewelry or perfume  Remove any nail coloring.  It will interfere with some of our monitoring equipment.  NOTE: Remember that this is not meant to be interpreted as a complete list of all possible complications.  Unforeseen problems may occur.  BLOOD THINNERS The following drugs contain aspirin or other products, which can cause increased bleeding during surgery and should not be taken for 2 weeks prior to and 1 week after surgery.  If you should need take something for relief of minor pain, you may take acetaminophen which is found in Tylenol,m Datril, Anacin-3 and Panadol. It is not blood thinner. The products listed below are.  Do not take any of the products listed below in addition to any listed on your instruction sheet.  A.P.C or A.P.C with Codeine Codeine Phosphate Capsules #3 Ibuprofen Ridaura  ABC compound Congesprin Imuran rimadil  Advil Cope Indocin Robaxisal  Alka-Seltzer Effervescent Pain Reliever and Antacid Coricidin or Coricidin-D  Indomethacin Rufen  Alka-Seltzer plus Cold Medicine Cosprin Ketoprofen S-A-C Tablets  Anacin Analgesic Tablets or Capsules Coumadin Korlgesic Salflex  Anacin Extra Strength Analgesic tablets or capsules CP-2 Tablets Lanoril Salicylate  Anaprox Cuprimine Capsules Levenox Salocol  Anexsia-D  Dalteparin Magan Salsalate  Anodynos Darvon compound Magnesium Salicylate Sine-off  Ansaid Dasin Capsules Magsal Sodium Salicylate  Anturane Depen Capsules Marnal Soma  APF Arthritis pain formula Dewitt's Pills Measurin Stanback  Argesic Dia-Gesic Meclofenamic Sulfinpyrazone  Arthritis Bayer Timed Release Aspirin Diclofenac Meclomen Sulindac  Arthritis pain formula Anacin Dicumarol Medipren Supac  Analgesic (Safety coated) Arthralgen Diffunasal Mefanamic Suprofen  Arthritis Strength Bufferin Dihydrocodeine Mepro Compound Suprol  Arthropan liquid Dopirydamole Methcarbomol with Aspirin Synalgos  ASA tablets/Enseals Disalcid Micrainin Tagament  Ascriptin Doan's Midol Talwin  Ascriptin A/D Dolene Mobidin Tanderil  Ascriptin Extra Strength Dolobid Moblgesic Ticlid  Ascriptin with Codeine Doloprin or Doloprin with Codeine Momentum Tolectin  Asperbuf Duoprin Mono-gesic Trendar  Aspergum Duradyne Motrin or Motrin IB Triminicin  Aspirin plain, buffered or enteric coated Durasal Myochrisine Trigesic  Aspirin Suppositories Easprin Nalfon Trillsate  Aspirin with Codeine Ecotrin Regular or Extra Strength Naprosyn Uracel  Atromid-S Efficin Naproxen Ursinus  Auranofin Capsules Elmiron Neocylate Vanquish  Axotal Emagrin Norgesic Verin  Azathioprine Empirin or Empirin with Codeine Normiflo Vitamin E  Azolid Emprazil Nuprin Voltaren  Bayer Aspirin plain, buffered or children's or timed BC Tablets or powders Encaprin Orgaran Warfarin Sodium  Buff-a-Comp Enoxaparin Orudis Zorpin  Buff-a-Comp with Codeine Equegesic Os-Cal-Gesic   Buffaprin Excedrin plain, buffered or Extra Strength Oxalid   Bufferin Arthritis Strength Feldene Oxphenbutazone   Bufferin plain or Extra Strength Feldene Capsules Oxycodone with Aspirin   Bufferin with Codeine Fenoprofen Fenoprofen Pabalate or Pabalate-SF   Buffets II Flogesic Panagesic   Buffinol plain or Extra Strength Florinal or Florinal with Codeine Panwarfarin     Buf-Tabs Flurbiprofen Penicillamine   Butalbital Compound Four-way cold tablets Penicillin   Butazolidin Fragmin Pepto-Bismol   Carbenicillin Geminisyn Percodan   Carna Arthritis Reliever Geopen Persantine   Carprofen Gold's salt Persistin   Chloramphenicol Goody's Phenylbutazone   Chloromycetin Haltrain Piroxlcam   Clmetidine heparin Plaquenil   Cllnoril Hyco-pap Ponstel   Clofibrate Hydroxy chloroquine Propoxyphen         Before stopping any of these medications, be sure  to consult the physician who ordered them.  Some, such as Coumadin (Warfarin) are ordered to prevent or treat serious conditions such as "deep thrombosis", "pumonary embolisms", and other heart problems.  The amount of time that you may need off of the medication may also vary with the medication and the reason for which you were taking it.  If you are taking any of these medications, please make sure you notify your pain physician before you undergo any procedures.          Epidural Steroid Injection An epidural steroid injection is a shot of steroid medicine and numbing medicine that is given into the space between the spinal cord and the bones in your back (epidural space). The shot helps relieve pain caused by an irritated or swollen nerve root. The amount of pain relief you get from the injection depends on what is causing the nerve to be swollen and irritated, and how long your pain lasts. You are more likely to benefit from this injection if your pain is strong and comes on suddenly rather than if you have had pain for a long time. Tell a health care provider about:  Any allergies you have.  All medicines you are taking, including vitamins, herbs, eye drops, creams, and over-the-counter medicines.  Any problems you or family members have had with anesthetic medicines.  Any blood disorders you have.  Any surgeries you have had.  Any medical conditions you have.  Whether you are pregnant or may be  pregnant. What are the risks? Generally, this is a safe procedure. However, problems may occur, including:  Headache.  Bleeding.  Infection.  Allergic reaction to medicines.  Damage to your nerves.  What happens before the procedure? Staying hydrated Follow instructions from your health care provider about hydration, which may include:  Up to 2 hours before the procedure - you may continue to drink clear liquids, such as water, clear fruit juice, black coffee, and plain tea.  Eating and drinking restrictions Follow instructions from your health care provider about eating and drinking, which may include:  8 hours before the procedure - stop eating heavy meals or foods such as meat, fried foods, or fatty foods.  6 hours before the procedure - stop eating light meals or foods, such as toast or cereal.  6 hours before the procedure - stop drinking milk or drinks that contain milk.  2 hours before the procedure - stop drinking clear liquids.  Medicine  You may be given medicines to lower anxiety.  Ask your health care provider about: ? Changing or stopping your regular medicines. This is especially important if you are taking diabetes medicines or blood thinners. ? Taking medicines such as aspirin and ibuprofen. These medicines can thin your blood. Do not take these medicines before your procedure if your health care provider instructs you not to. General instructions  Plan to have someone take you home from the hospital or clinic. What happens during the procedure?  You may receive a medicine to help you relax (sedative).  You will be asked to lie on your abdomen.  The injection site will be cleaned.  A numbing medicine (local anesthetic) will be used to numb the injection site.  A needle will be inserted through your skin into the epidural space. You may feel some discomfort when this happens. An X-ray machine will be used to make sure the needle is put as close as  possible to the affected nerve.  A steroid medicine  and a local anesthetic will be injected into the epidural space.  The needle will be removed.  A bandage (dressing) will be put over the injection site. What happens after the procedure?  Your blood pressure, heart rate, breathing rate, and blood oxygen level will be monitored until the medicines you were given have worn off.  Your arm or leg may feel weak or numb for a few hours.  The injection site may feel sore.  Do not drive for 24 hours if you received a sedative. This information is not intended to replace advice given to you by your health care provider. Make sure you discuss any questions you have with your health care provider. Document Released: 05/31/2007 Document Revised: 08/05/2015 Document Reviewed: 06/09/2015 Elsevier Interactive Patient Education  2017 [employer].

## 2016-12-27 NOTE — Telephone Encounter (Signed)
Sonia Baller from [employer]. Therapy lvmail stating she has called patient 3 times. Finally spoke with patient today and was told [place] was too far for them to travel. FYI

## 2017-01-04 ENCOUNTER — Ambulatory Visit
Payer: Medicare HMO | Attending: Student in an Organized Health Care Education/Training Program | Admitting: Student in an Organized Health Care Education/Training Program

## 2017-01-09 DIAGNOSIS — M545 Low back pain: Secondary | ICD-10-CM | POA: Diagnosis not present

## 2017-01-09 DIAGNOSIS — M5416 Radiculopathy, lumbar region: Secondary | ICD-10-CM | POA: Diagnosis not present

## 2017-01-09 DIAGNOSIS — R262 Difficulty in walking, not elsewhere classified: Secondary | ICD-10-CM | POA: Diagnosis not present

## 2017-01-10 ENCOUNTER — Ambulatory Visit: Payer: Medicare HMO | Admitting: Student in an Organized Health Care Education/Training Program

## 2017-01-18 DIAGNOSIS — M545 Low back pain: Secondary | ICD-10-CM | POA: Diagnosis not present

## 2017-01-18 DIAGNOSIS — M5416 Radiculopathy, lumbar region: Secondary | ICD-10-CM | POA: Diagnosis not present

## 2017-01-18 DIAGNOSIS — R262 Difficulty in walking, not elsewhere classified: Secondary | ICD-10-CM | POA: Diagnosis not present

## 2017-01-21 DIAGNOSIS — R69 Illness, unspecified: Secondary | ICD-10-CM | POA: Diagnosis not present

## 2017-02-14 DIAGNOSIS — R5381 Other malaise: Secondary | ICD-10-CM | POA: Diagnosis not present

## 2017-02-14 DIAGNOSIS — M5440 Lumbago with sciatica, unspecified side: Secondary | ICD-10-CM | POA: Diagnosis not present

## 2017-02-14 DIAGNOSIS — R0602 Shortness of breath: Secondary | ICD-10-CM | POA: Diagnosis not present

## 2017-02-14 DIAGNOSIS — E119 Type 2 diabetes mellitus without complications: Secondary | ICD-10-CM | POA: Diagnosis not present

## 2017-02-14 DIAGNOSIS — I10 Essential (primary) hypertension: Secondary | ICD-10-CM | POA: Diagnosis not present

## 2017-02-14 DIAGNOSIS — N183 Chronic kidney disease, stage 3 (moderate): Secondary | ICD-10-CM | POA: Diagnosis not present

## 2017-02-14 DIAGNOSIS — R140 Abdominal distension (gaseous): Secondary | ICD-10-CM | POA: Diagnosis not present

## 2017-02-14 DIAGNOSIS — E785 Hyperlipidemia, unspecified: Secondary | ICD-10-CM | POA: Diagnosis not present

## 2017-02-14 DIAGNOSIS — E7849 Other hyperlipidemia: Secondary | ICD-10-CM | POA: Diagnosis not present

## 2017-02-14 DIAGNOSIS — C61 Malignant neoplasm of prostate: Secondary | ICD-10-CM | POA: Diagnosis not present

## 2017-02-16 ENCOUNTER — Ambulatory Visit
Admission: RE | Admit: 2017-02-16 | Discharge: 2017-02-16 | Disposition: A | Discharge status: Home / Self Care | Payer: Medicare HMO | Source: Ambulatory Visit | Attending: Internal Medicine | Admitting: Internal Medicine

## 2017-02-16 ENCOUNTER — Other Ambulatory Visit: Payer: Self-pay | Admitting: Internal Medicine

## 2017-02-16 DIAGNOSIS — R0602 Shortness of breath: Secondary | ICD-10-CM | POA: Diagnosis not present

## 2017-02-16 DIAGNOSIS — R140 Abdominal distension (gaseous): Secondary | ICD-10-CM | POA: Diagnosis not present

## 2017-02-16 DIAGNOSIS — M5440 Lumbago with sciatica, unspecified side: Secondary | ICD-10-CM | POA: Diagnosis not present

## 2017-02-16 DIAGNOSIS — E785 Hyperlipidemia, unspecified: Secondary | ICD-10-CM | POA: Diagnosis not present

## 2017-02-16 DIAGNOSIS — D649 Anemia, unspecified: Secondary | ICD-10-CM | POA: Diagnosis not present

## 2017-02-20 DIAGNOSIS — R140 Abdominal distension (gaseous): Secondary | ICD-10-CM | POA: Diagnosis not present

## 2017-02-20 DIAGNOSIS — R0602 Shortness of breath: Secondary | ICD-10-CM | POA: Diagnosis not present

## 2017-02-20 DIAGNOSIS — E785 Hyperlipidemia, unspecified: Secondary | ICD-10-CM | POA: Diagnosis not present

## 2017-02-20 DIAGNOSIS — M5440 Lumbago with sciatica, unspecified side: Secondary | ICD-10-CM | POA: Diagnosis not present

## 2017-02-21 ENCOUNTER — Ambulatory Visit: Payer: Medicare HMO | Admitting: Gastroenterology

## 2017-02-21 ENCOUNTER — Inpatient Hospital Stay: Payer: Medicare HMO | Attending: Oncology

## 2017-02-21 ENCOUNTER — Encounter: Payer: Self-pay | Admitting: Gastroenterology

## 2017-02-21 VITALS — BP 113/63 | HR 71 | Temp 97.7°F | Ht 68.0 in | Wt 197.2 lb

## 2017-02-21 DIAGNOSIS — K5900 Constipation, unspecified: Secondary | ICD-10-CM | POA: Diagnosis not present

## 2017-02-21 MED ORDER — PEG 3350-KCL-NA BICARB-NACL 420 G PO SOLR: 4000.0000 mL | Freq: Once | ORAL | 0 refills | Status: AC

## 2017-02-21 NOTE — Progress Notes (Signed)
Jonathon Bellows MD, MRCP(U.K) [address]  [place]  [place], [place] [address]  Main: [phone]  Fax: [phone]   Gastroenterology Consultation  Referring Provider:     Cletis Athens, MD Primary Care Physician:  Cletis Athens, MD Primary Gastroenterologist:  Dr. Jonathon Bellows  Reason for Consultation:     Abdominal pain         HPI:   Jesse Dickson is a 76 y.o. y/o male referred for consultation & management  by Dr. Cletis Athens, MD.     He has been referred for abdominal pain and constipation. He has a history of diabetes, CKD. Chronic low back pain . He had a x ray of the abdomen on 02/16/17 which showed a large qty of stool throughout the colon .   Onset: 2 weeks , long history of constipation - worse recently  Site :distended but no pain- prior to bowel movement , has a bowel movement every 3-4 days, hard like rocks and painful, When he has a bowel movement feels better. No change in shape of his stool. No weight loss. Last colonoscopy 2015 - cant recall the result.   No family history of colon cancer. He takes miralax which does not help.   Past Medical History:  Diagnosis Date  . CHF (congestive heart failure) ([place])   . Diabetes mellitus without complication ([place])   . DVT (deep venous thrombosis) ([place])   . GERD (gastroesophageal reflux disease)   . Hypercholesteremia   . Hypertension   . Prostate CA ([place]) 2015   only surgery per pt    Past Surgical History:  Procedure Laterality Date  . PROSTATECTOMY      Prior to Admission medications   Medication Sig Start Date End Date Taking? Authorizing Provider  aspirin EC 81 MG tablet Take by mouth.    [provider]  atorvastatin (LIPITOR) 20 MG tablet Take 20 mg by mouth daily.    [provider]  calcitRIOL (ROCALTROL) 0.25 MCG capsule Take 0.25 mcg by mouth daily. 11/13/16   [provider]  carvedilol (COREG) 25 MG tablet Take 25 mg by mouth 2 (two) times daily.    [provider]  docusate sodium (COLACE) 100 MG capsule Take 100 mg by mouth 2 (two) times daily.    [provider]  famotidine (PEPCID) 20 MG tablet Take by mouth. 04/08/16   [provider]  furosemide (LASIX) 40 MG tablet Take 40 mg by mouth 2 (two) times daily.    [provider]  gabapentin (NEURONTIN) 100 MG capsule Take 200 mg by mouth daily. 06/20/16   [provider]  insulin glargine (LANTUS) 100 UNIT/ML injection Inject 25 Units into the skin at bedtime.     [provider]  Insulin Syringe-Needle U-100 (INSULIN SYRINGE 1CC/30GX5/16") 30G X 5/16" 1 ML MISC Use 1 Dose 4 (four) times daily. 09/10/15   [provider]  Roma Schanz test strip CHECK DAILY 01/21/17   [provider]  polyethylene glycol (MIRALAX / GLYCOLAX) packet Take by mouth.    [provider]  tamsulosin (FLOMAX) 0.4 MG CAPS capsule Take 0.4 mg by mouth at bedtime.    [provider]  traMADol (ULTRAM) 50 MG tablet Take 1 tablet (50 mg total) by mouth every 6 (six) hours as needed. 02/18/15   Johnn Hai, PA-C  TRESIBA FLEXTOUCH 100 UNIT/ML SOPN FlexTouch Pen INJECT 30UNITS WITH AUTO-INJECTOR ONCE DAILY 02/14/17   [provider]  VENTOLIN HFA  108 (90 Base) MCG/ACT inhaler INHALE 1 PUFF BY MOUTH 3 TIMES A DAY AS NEEDED 02/14/17   [provider]    Family History  Problem Relation Age of Onset  . Diabetes Mother   . COPD Mother   . Cancer Sister   . Cancer Sister   . Dementia Sister      Social History   Tobacco Use  . Smoking status: Never Smoker  . Smokeless tobacco: Never Used  Substance Use Topics  . Alcohol use: Yes  . Drug use: No    Allergies as of 02/21/2017  . (No Known Allergies)    Review of Systems:    All systems reviewed and negative except where noted in HPI.   Physical Exam:  BP 113/63   Pulse 71   Temp 97.7 F (36.5 C) (Oral)   Ht 5\' 8"  (1.727 m)   Wt 197 lb 3.2 oz (89.4 kg)    BMI 29.98 kg/m  No LMP for male patient. Psych:  Alert and cooperative. Normal mood and affect. General:   Alert,  Well-developed, well-nourished, pleasant and cooperative in NAD Head:  Normocephalic and atraumatic. Eyes:  Sclera clear, no icterus.   Conjunctiva pink. Ears:  Normal auditory acuity. Nose:  No deformity, discharge, or lesions. Mouth:  No deformity or lesions,oropharynx pink & moist. Neck:  Supple; no masses or thyromegaly. Lungs:  Respirations even and unlabored.  Clear throughout to auscultation.   No wheezes, crackles, or rhonchi. No acute distress. Heart:  Regular rate and rhythm; no murmurs, clicks, rubs, or gallops. Abdomen:  Normal bowel sounds.  No bruits.  Soft, non-tender and non-distended without masses, hepatosplenomegaly or hernias noted.  No guarding or rebound tenderness.    Neurologic:  Alert and oriented x3;  grossly normal neurologically. Skin:  Intact without significant lesions or rashes. No jaundice. Lymph Nodes:  No significant cervical adenopathy. Psych:  Alert and cooperative. Normal mood and affect.  Imaging Studies: Dg Abd 1 View  Result Date: 02/16/2017 CLINICAL DATA:  Abdominal distention. EXAM: ABDOMEN - 1 VIEW COMPARISON:  02/18/2015 . FINDINGS: Soft tissue structures are unremarkable. Air-filled loops of small and large bowel are noted. No prominent distention . This is a nonspecific finding. Large amount of stool noted throughout colon suggesting constipation. Vascular calcification. Degenerative changes lumbar spine and both hips. Bibasilar atelectasis. IMPRESSION: Large amount of stool noted throughout the colon suggesting constipation. Air-filled loops of small and large bowel noted. No prominent bowel distention. Follow-up exam can be obtained to demonstrate resolution. Electronically Signed   By: Marcello Moores  Register   On: 02/16/2017 15:08    Assessment and Plan:   Jesse Dickson is a 76 y.o. y/o male has been referred for chronic  constipation and abdominal pain. He complains mainly of distension when he does not have a bowel movement for a few weeks.    Plan  1. High fiber diet - patient information provided 2. Commence on Linzess 290 mcg-samples provided . Golytely today to clean out the colon before starting linzess   3. IF pain no better then will need CT scan of the abdomen+/- endoscopy/colonoscopy   F/u in 2- 4 weeks Dr Jonathon Bellows MD,MRCP(U.K)

## 2017-02-21 NOTE — Patient Instructions (Signed)
Thank you for choosing [facility] for your Gastrointestinal Care.  During todays visit Dr. Vicente Males has advised the following:  1.Pick up rx for Golytely at [facility]. 2.  After you have completed Golytely begin daily samples Linzess 1 daily. 3. Notify our office to let us know if Linzess 290 mcg has improved constipation. 4.  Please read and begin High Fiber Diet. 5. Follow up with Dr. Vicente Males in 2 weeks.  Again thank you and have a great holiday season.  Staff at [facility]

## 2017-02-23 DIAGNOSIS — I10 Essential (primary) hypertension: Secondary | ICD-10-CM | POA: Diagnosis not present

## 2017-02-23 DIAGNOSIS — N2581 Secondary hyperparathyroidism of renal origin: Secondary | ICD-10-CM | POA: Diagnosis not present

## 2017-02-23 DIAGNOSIS — E1122 Type 2 diabetes mellitus with diabetic chronic kidney disease: Secondary | ICD-10-CM | POA: Diagnosis not present

## 2017-02-23 DIAGNOSIS — D631 Anemia in chronic kidney disease: Secondary | ICD-10-CM | POA: Diagnosis not present

## 2017-02-23 DIAGNOSIS — N183 Chronic kidney disease, stage 3 (moderate): Secondary | ICD-10-CM | POA: Diagnosis not present

## 2017-02-24 DIAGNOSIS — D631 Anemia in chronic kidney disease: Secondary | ICD-10-CM | POA: Diagnosis not present

## 2017-02-24 DIAGNOSIS — E1122 Type 2 diabetes mellitus with diabetic chronic kidney disease: Secondary | ICD-10-CM | POA: Diagnosis not present

## 2017-02-24 DIAGNOSIS — R809 Proteinuria, unspecified: Secondary | ICD-10-CM | POA: Diagnosis not present

## 2017-02-24 DIAGNOSIS — N183 Chronic kidney disease, stage 3 (moderate): Secondary | ICD-10-CM | POA: Diagnosis not present

## 2017-03-08 ENCOUNTER — Ambulatory Visit: Payer: Medicare HMO | Admitting: Gastroenterology

## 2017-03-12 DIAGNOSIS — R69 Illness, unspecified: Secondary | ICD-10-CM | POA: Diagnosis not present

## 2017-03-14 ENCOUNTER — Ambulatory Visit (INDEPENDENT_AMBULATORY_CARE_PROVIDER_SITE_OTHER): Payer: Medicare HMO | Admitting: Gastroenterology

## 2017-03-14 ENCOUNTER — Inpatient Hospital Stay: Payer: Medicare HMO | Attending: Oncology

## 2017-03-14 ENCOUNTER — Encounter: Payer: Self-pay | Admitting: Gastroenterology

## 2017-03-14 VITALS — BP 116/63 | HR 71 | Temp 97.9°F | Ht 68.0 in | Wt 202.6 lb

## 2017-03-14 DIAGNOSIS — C9000 Multiple myeloma not having achieved remission: Secondary | ICD-10-CM | POA: Diagnosis present

## 2017-03-14 DIAGNOSIS — D631 Anemia in chronic kidney disease: Secondary | ICD-10-CM | POA: Insufficient documentation

## 2017-03-14 DIAGNOSIS — K5900 Constipation, unspecified: Secondary | ICD-10-CM

## 2017-03-14 DIAGNOSIS — N183 Chronic kidney disease, stage 3 unspecified: Secondary | ICD-10-CM

## 2017-03-14 DIAGNOSIS — D8989 Other specified disorders involving the immune mechanism, not elsewhere classified: Secondary | ICD-10-CM

## 2017-03-14 DIAGNOSIS — I129 Hypertensive chronic kidney disease with stage 1 through stage 4 chronic kidney disease, or unspecified chronic kidney disease: Secondary | ICD-10-CM | POA: Diagnosis not present

## 2017-03-14 MED ORDER — LINACLOTIDE 290 MCG PO CAPS: 290.0000 ug | ORAL_CAPSULE | Freq: Every day | ORAL | 2 refills | Status: AC

## 2017-03-14 NOTE — Progress Notes (Signed)
Jonathon Bellows MD, MRCP(U.K) [address]  [place]  [place], [place] [address]  Main: [phone]  Fax: [phone]   Primary Care Physician: Cletis Athens, MD  Primary Gastroenterologist:  Dr. Jonathon Bellows   No chief complaint on file.   HPI: Normal Recinos is a 77 y.o. male    Summary of history : He was initially seen on 02/21/17 for constipation. He has a history of diabetes, CKD. Chronic low back pain . He had a x ray of the abdomen on 02/16/17 which showed a large qty of stool throughout the colon .He was having a bowel movement every 3-4 days like rocks . Failed miralax. Last colonoscopy 2015 in [place] - cant recall the result. No family history of colon cancer   Interval history   02/21/2017-  03/14/2016   Commenced on linzess 290 mcg 2 weeks back and since . Doing well feels over 90% better, 1-2 bowel movements a day , softer, not like rocks which he had earlier. Abdomen also feels better. No pain.  Denies any rectal bleeding .    Current Outpatient Medications  Medication Sig Dispense Refill  . aspirin EC 81 MG tablet Take by mouth.    Marland Kitchen atorvastatin (LIPITOR) 20 MG tablet Take 20 mg by mouth daily.    . calcitRIOL (ROCALTROL) 0.25 MCG capsule Take 0.25 mcg by mouth daily.  6  . carvedilol (COREG) 25 MG tablet Take 25 mg by mouth 2 (two) times daily.    Marland Kitchen docusate sodium (COLACE) 100 MG capsule Take 100 mg by mouth 2 (two) times daily.    . famotidine (PEPCID) 20 MG tablet Take by mouth.    . furosemide (LASIX) 40 MG tablet Take 40 mg by mouth 2 (two) times daily.    Marland Kitchen gabapentin (NEURONTIN) 100 MG capsule Take 200 mg by mouth daily.  2  . insulin glargine (LANTUS) 100 UNIT/ML injection Inject 25 Units into the skin at bedtime.     . Insulin Syringe-Needle U-100 (INSULIN SYRINGE 1CC/30GX5/16") 30G X 5/16" 1 ML MISC Use 1 Dose 4 (four) times daily.    Glory Rosebush VERIO test strip CHECK DAILY  4  . polyethylene glycol (MIRALAX / GLYCOLAX) packet Take by mouth.      . tamsulosin (FLOMAX) 0.4 MG CAPS capsule Take 0.4 mg by mouth at bedtime.    . traMADol (ULTRAM) 50 MG tablet Take 1 tablet (50 mg total) by mouth every 6 (six) hours as needed. 20 tablet 0  . TRESIBA FLEXTOUCH 100 UNIT/ML SOPN FlexTouch Pen INJECT 30UNITS WITH AUTO-INJECTOR ONCE DAILY  6  . VENTOLIN HFA 108 (90 Base) MCG/ACT inhaler INHALE 1 PUFF BY MOUTH 3 TIMES A DAY AS NEEDED  8   No current facility-administered medications for this visit.     Allergies as of 03/14/2017  . (No Known Allergies)    ROS:  General: Negative for anorexia, weight loss, fever, chills, fatigue, weakness. ENT: Negative for hoarseness, difficulty swallowing , nasal congestion. CV: Negative for chest pain, angina, palpitations, dyspnea on exertion, peripheral edema.  Respiratory: Negative for dyspnea at rest, dyspnea on exertion, cough, sputum, wheezing.  GI: See history of present illness. GU:  Negative for dysuria, hematuria, urinary incontinence, urinary frequency, nocturnal urination.  Endo: Negative for unusual weight change.    Physical Examination:   There were no vitals taken for this visit.  General: Well-nourished, well-developed in no acute distress.  Eyes: No icterus. Conjunctivae pink. Mouth: Oropharyngeal mucosa moist and pink ,  no lesions erythema or exudate. Lungs: Clear to auscultation bilaterally. Non-labored. Heart: Regular rate and rhythm, no murmurs rubs or gallops.  Abdomen: Bowel sounds are normal, nontender, nondistended, no hepatosplenomegaly or masses, no abdominal bruits or hernia , no rebound or guarding.   Extremities: No lower extremity edema. No clubbing or deformities. Neuro: Alert and oriented x 3.  Grossly intact. Skin: Warm and dry, no jaundice.   Psych: Alert and cooperative, normal mood and affect.   Imaging Studies: Dg Abd 1 View  Result Date: 02/16/2017 CLINICAL DATA:  Abdominal distention. EXAM: ABDOMEN - 1 VIEW COMPARISON:  02/18/2015 . FINDINGS: Soft  tissue structures are unremarkable. Air-filled loops of small and large bowel are noted. No prominent distention . This is a nonspecific finding. Large amount of stool noted throughout colon suggesting constipation. Vascular calcification. Degenerative changes lumbar spine and both hips. Bibasilar atelectasis. IMPRESSION: Large amount of stool noted throughout the colon suggesting constipation. Air-filled loops of small and large bowel noted. No prominent bowel distention. Follow-up exam can be obtained to demonstrate resolution. Electronically Signed   By: Marcello Moores  Register   On: 02/16/2017 15:08    Assessment and Plan:   Lennell Shanks is a 77 y.o. y/o male *here to follow up for chronic constipation and abdominal pain. Symptoms almost resolved with linzess. Will provide him a refill and a discount card. If symptoms recur  then will need CT scan of the abdomen+/- endoscopy/colonoscopy   Dr Jonathon Bellows  MD,MRCP [facility]) Follow up PRN

## 2017-03-15 ENCOUNTER — Other Ambulatory Visit: Payer: Self-pay

## 2017-03-15 DIAGNOSIS — D8989 Other specified disorders involving the immune mechanism, not elsewhere classified: Secondary | ICD-10-CM

## 2017-03-15 DIAGNOSIS — N183 Chronic kidney disease, stage 3 unspecified: Secondary | ICD-10-CM

## 2017-03-16 ENCOUNTER — Inpatient Hospital Stay: Payer: Medicare HMO

## 2017-03-16 DIAGNOSIS — C9000 Multiple myeloma not having achieved remission: Secondary | ICD-10-CM | POA: Diagnosis not present

## 2017-03-16 DIAGNOSIS — N183 Chronic kidney disease, stage 3 unspecified: Secondary | ICD-10-CM

## 2017-03-16 DIAGNOSIS — I129 Hypertensive chronic kidney disease with stage 1 through stage 4 chronic kidney disease, or unspecified chronic kidney disease: Secondary | ICD-10-CM | POA: Diagnosis not present

## 2017-03-16 DIAGNOSIS — D8989 Other specified disorders involving the immune mechanism, not elsewhere classified: Secondary | ICD-10-CM

## 2017-03-16 DIAGNOSIS — D631 Anemia in chronic kidney disease: Secondary | ICD-10-CM | POA: Diagnosis not present

## 2017-03-16 LAB — COMPREHENSIVE METABOLIC PANEL
ALK PHOS: 103 U/L (ref 38–126)
ALT: 16 U/L — ABNORMAL LOW (ref 17–63)
AST: 17 U/L (ref 15–41)
Albumin: 3.7 g/dL (ref 3.5–5.0)
Anion gap: 10 (ref 5–15)
BUN: 34 mg/dL — AB (ref 6–20)
CALCIUM: 9.1 mg/dL (ref 8.9–10.3)
CHLORIDE: 102 mmol/L (ref 101–111)
CO2: 26 mmol/L (ref 22–32)
CREATININE: 2.22 mg/dL — AB (ref 0.61–1.24)
GFR calc Af Amer: 32 mL/min — ABNORMAL LOW (ref >60)
GFR calc non Af Amer: 27 mL/min — ABNORMAL LOW (ref >60)
GLUCOSE: 214 mg/dL — AB (ref 65–99)
Potassium: 4.6 mmol/L (ref 3.5–5.1)
SODIUM: 138 mmol/L (ref 135–145)
Total Bilirubin: 0.9 mg/dL (ref 0.3–1.2)
Total Protein: 7.3 g/dL (ref 6.5–8.1)

## 2017-03-16 LAB — CBC WITH DIFFERENTIAL/PLATELET
Basophils Absolute: 0 10*3/uL (ref 0–0.1)
Basophils Relative: 1 %
EOS PCT: 3 %
Eosinophils Absolute: 0.3 10*3/uL (ref 0–0.7)
HCT: 28.1 % — ABNORMAL LOW (ref 40.0–52.0)
Hemoglobin: 8.9 g/dL — ABNORMAL LOW (ref 13.0–18.0)
LYMPHS ABS: 2.4 10*3/uL (ref 1.0–3.6)
Lymphocytes Relative: 32 %
MCH: 29 pg (ref 26.0–34.0)
MCHC: 31.7 g/dL — AB (ref 32.0–36.0)
MCV: 91.6 fL (ref 80.0–100.0)
MONO ABS: 0.7 10*3/uL (ref 0.2–1.0)
MONOS PCT: 9 %
Neutro Abs: 4.3 10*3/uL (ref 1.4–6.5)
Neutrophils Relative %: 55 %
PLATELETS: 219 10*3/uL (ref 150–440)
RBC: 3.07 MIL/uL — ABNORMAL LOW (ref 4.40–5.90)
RDW: 13.4 % (ref 11.5–14.5)
WBC: 7.7 10*3/uL (ref 3.8–10.6)

## 2017-03-17 LAB — KAPPA/LAMBDA LIGHT CHAINS
Kappa free light chain: 27 mg/L — ABNORMAL HIGH (ref 3.3–19.4)
Kappa, lambda light chain ratio: 0.02 — ABNORMAL LOW (ref 0.26–1.65)
Lambda free light chains: 1168 mg/L — ABNORMAL HIGH (ref 5.7–26.3)

## 2017-03-20 ENCOUNTER — Ambulatory Visit
Admission: RE | Admit: 2017-03-20 | Discharge: 2017-03-20 | Disposition: A | Discharge status: Home / Self Care | Payer: Medicare HMO | Source: Ambulatory Visit | Attending: Internal Medicine | Admitting: Internal Medicine

## 2017-03-20 ENCOUNTER — Other Ambulatory Visit: Payer: Self-pay | Admitting: Internal Medicine

## 2017-03-20 ENCOUNTER — Telehealth: Payer: Self-pay | Admitting: Gastroenterology

## 2017-03-20 DIAGNOSIS — R0602 Shortness of breath: Secondary | ICD-10-CM | POA: Diagnosis not present

## 2017-03-20 DIAGNOSIS — R05 Cough: Secondary | ICD-10-CM | POA: Diagnosis not present

## 2017-03-20 DIAGNOSIS — R918 Other nonspecific abnormal finding of lung field: Secondary | ICD-10-CM | POA: Diagnosis not present

## 2017-03-20 DIAGNOSIS — C61 Malignant neoplasm of prostate: Secondary | ICD-10-CM | POA: Diagnosis not present

## 2017-03-20 DIAGNOSIS — M5440 Lumbago with sciatica, unspecified side: Secondary | ICD-10-CM | POA: Diagnosis not present

## 2017-03-20 DIAGNOSIS — R079 Chest pain, unspecified: Secondary | ICD-10-CM

## 2017-03-20 DIAGNOSIS — J90 Pleural effusion, not elsewhere classified: Secondary | ICD-10-CM | POA: Insufficient documentation

## 2017-03-20 DIAGNOSIS — E785 Hyperlipidemia, unspecified: Secondary | ICD-10-CM | POA: Diagnosis not present

## 2017-03-20 LAB — MULTIPLE MYELOMA PANEL, SERUM
ALBUMIN SERPL ELPH-MCNC: 3.3 g/dL (ref 2.9–4.4)
ALPHA2 GLOB SERPL ELPH-MCNC: 1.2 g/dL — AB (ref 0.4–1.0)
Albumin/Glob SerPl: 1 (ref 0.7–1.7)
Alpha 1: 0.3 g/dL (ref 0.0–0.4)
B-Globulin SerPl Elph-Mcnc: 0.9 g/dL (ref 0.7–1.3)
Gamma Glob SerPl Elph-Mcnc: 1 g/dL (ref 0.4–1.8)
Globulin, Total: 3.4 g/dL (ref 2.2–3.9)
IGG (IMMUNOGLOBIN G), SERUM: 1124 mg/dL (ref 700–1600)
IgA: 136 mg/dL (ref 61–437)
IgM (Immunoglobulin M), Srm: 33 mg/dL (ref 15–143)
TOTAL PROTEIN ELP: 6.7 g/dL (ref 6.0–8.5)

## 2017-03-20 NOTE — Telephone Encounter (Signed)
Patients wife called and stated that Mr. Jesse Dickson is having pain when passing urine. Per Panya, patient needs to call PCP or go to the ED

## 2017-03-22 NOTE — Progress Notes (Signed)
I called the house and got voicemail and left message to call me back about going over lab results.

## 2017-03-23 ENCOUNTER — Telehealth: Payer: Self-pay | Admitting: *Deleted

## 2017-03-23 NOTE — Telephone Encounter (Signed)
Tried calling pt again today to see if I could get them on the phone to speak about lab results. Left my phone number to call me back to discuss results and what the plan will be.

## 2017-03-23 NOTE — Telephone Encounter (Signed)
-----   Message from Sindy Guadeloupe, MD sent at 03/20/2017 12:41 PM EST ----- I would like him to get PET/CT to rule out lytic lesions. Bone survey in august 2018 did not show any lytic lesions. He is getting more anemic. Repeat cbc, cmp again in 1 month. See me in March 2019 as scheduled. We will see him sooner if concerning findings on PET scan

## 2017-03-27 ENCOUNTER — Telehealth: Payer: Self-pay | Admitting: *Deleted

## 2017-03-27 DIAGNOSIS — J180 Bronchopneumonia, unspecified organism: Secondary | ICD-10-CM | POA: Diagnosis not present

## 2017-03-27 NOTE — Telephone Encounter (Signed)
Called and able to get wife on the phone. I went over the results of the hgb is slightly lower, the kidneys level was a little worse and the kappa/lambda chains increased ratio.  I said that because the labs have gotten slightly worse Dr. Janese Banks would like to have him get a pet scan and then see him.  She is agreeable to the scan.

## 2017-03-28 ENCOUNTER — Other Ambulatory Visit: Payer: Self-pay | Admitting: *Deleted

## 2017-03-28 DIAGNOSIS — D472 Monoclonal gammopathy: Secondary | ICD-10-CM

## 2017-03-28 DIAGNOSIS — D8989 Other specified disorders involving the immune mechanism, not elsewhere classified: Secondary | ICD-10-CM

## 2017-04-03 ENCOUNTER — Telehealth: Payer: Self-pay | Admitting: *Deleted

## 2017-04-03 NOTE — Telephone Encounter (Signed)
I had called wife on Friday last week and she wanted me to wait to tell her about the instructions for PET because we did not have it approved yet.  She was told that it is sch. For 1/29 at 9 [facility]. I called back today to say it is approved and gave instructions of NPO after midnight tonigh, low carb diet tonight for supper and went over staying away from potatoes, pasta, cracker, chips, bread.  He can eat meat and vegetables. She understands and will have pt there tom. am

## 2017-04-04 ENCOUNTER — Encounter
Admission: RE | Admit: 2017-04-04 | Discharge: 2017-04-04 | Disposition: A | Discharge status: Home / Self Care | Payer: Medicare HMO | Source: Ambulatory Visit | Attending: Oncology | Admitting: Oncology

## 2017-04-04 DIAGNOSIS — D472 Monoclonal gammopathy: Secondary | ICD-10-CM | POA: Diagnosis not present

## 2017-04-04 DIAGNOSIS — D8989 Other specified disorders involving the immune mechanism, not elsewhere classified: Secondary | ICD-10-CM | POA: Insufficient documentation

## 2017-04-04 LAB — GLUCOSE, CAPILLARY: GLUCOSE-CAPILLARY: 166 mg/dL — AB (ref 65–99)

## 2017-04-04 MED ORDER — FLUDEOXYGLUCOSE F - 18 (FDG) INJECTION
11.6900 | Freq: Once | INTRAVENOUS | Status: AC | PRN
  Administered 2017-04-04: 11.69 via INTRAVENOUS

## 2017-04-05 DIAGNOSIS — C61 Malignant neoplasm of prostate: Secondary | ICD-10-CM | POA: Diagnosis not present

## 2017-04-05 DIAGNOSIS — R140 Abdominal distension (gaseous): Secondary | ICD-10-CM | POA: Diagnosis not present

## 2017-04-05 DIAGNOSIS — R0602 Shortness of breath: Secondary | ICD-10-CM | POA: Diagnosis not present

## 2017-04-05 DIAGNOSIS — I10 Essential (primary) hypertension: Secondary | ICD-10-CM | POA: Diagnosis not present

## 2017-04-28 DIAGNOSIS — R69 Illness, unspecified: Secondary | ICD-10-CM | POA: Diagnosis not present

## 2017-05-02 DIAGNOSIS — R0602 Shortness of breath: Secondary | ICD-10-CM | POA: Diagnosis not present

## 2017-05-02 DIAGNOSIS — I10 Essential (primary) hypertension: Secondary | ICD-10-CM | POA: Diagnosis not present

## 2017-05-02 DIAGNOSIS — C61 Malignant neoplasm of prostate: Secondary | ICD-10-CM | POA: Diagnosis not present

## 2017-05-02 DIAGNOSIS — R140 Abdominal distension (gaseous): Secondary | ICD-10-CM | POA: Diagnosis not present

## 2017-05-16 DIAGNOSIS — C61 Malignant neoplasm of prostate: Secondary | ICD-10-CM | POA: Diagnosis not present

## 2017-05-16 DIAGNOSIS — E119 Type 2 diabetes mellitus without complications: Secondary | ICD-10-CM | POA: Diagnosis not present

## 2017-05-16 DIAGNOSIS — R0602 Shortness of breath: Secondary | ICD-10-CM | POA: Diagnosis not present

## 2017-05-16 DIAGNOSIS — K2900 Acute gastritis without bleeding: Secondary | ICD-10-CM | POA: Diagnosis not present

## 2017-05-30 ENCOUNTER — Encounter: Payer: Self-pay | Admitting: Oncology

## 2017-05-30 ENCOUNTER — Inpatient Hospital Stay: Payer: Medicare HMO

## 2017-05-30 ENCOUNTER — Inpatient Hospital Stay: Payer: Medicare HMO | Attending: Oncology | Admitting: Oncology

## 2017-05-30 VITALS — BP 97/49 | HR 67 | Temp 97.6°F | Ht 68.0 in | Wt 192.0 lb

## 2017-05-30 DIAGNOSIS — M545 Low back pain: Secondary | ICD-10-CM

## 2017-05-30 DIAGNOSIS — Z809 Family history of malignant neoplasm, unspecified: Secondary | ICD-10-CM

## 2017-05-30 DIAGNOSIS — I509 Heart failure, unspecified: Secondary | ICD-10-CM | POA: Insufficient documentation

## 2017-05-30 DIAGNOSIS — N183 Chronic kidney disease, stage 3 unspecified: Secondary | ICD-10-CM

## 2017-05-30 DIAGNOSIS — Z8546 Personal history of malignant neoplasm of prostate: Secondary | ICD-10-CM | POA: Diagnosis not present

## 2017-05-30 DIAGNOSIS — Z7982 Long term (current) use of aspirin: Secondary | ICD-10-CM | POA: Diagnosis not present

## 2017-05-30 DIAGNOSIS — Z794 Long term (current) use of insulin: Secondary | ICD-10-CM | POA: Diagnosis not present

## 2017-05-30 DIAGNOSIS — K219 Gastro-esophageal reflux disease without esophagitis: Secondary | ICD-10-CM | POA: Diagnosis not present

## 2017-05-30 DIAGNOSIS — N2581 Secondary hyperparathyroidism of renal origin: Secondary | ICD-10-CM | POA: Diagnosis not present

## 2017-05-30 DIAGNOSIS — D631 Anemia in chronic kidney disease: Secondary | ICD-10-CM | POA: Insufficient documentation

## 2017-05-30 DIAGNOSIS — R5382 Chronic fatigue, unspecified: Secondary | ICD-10-CM

## 2017-05-30 DIAGNOSIS — E1122 Type 2 diabetes mellitus with diabetic chronic kidney disease: Secondary | ICD-10-CM | POA: Diagnosis not present

## 2017-05-30 DIAGNOSIS — E7800 Pure hypercholesterolemia, unspecified: Secondary | ICD-10-CM

## 2017-05-30 DIAGNOSIS — C9000 Multiple myeloma not having achieved remission: Secondary | ICD-10-CM

## 2017-05-30 DIAGNOSIS — Z79899 Other long term (current) drug therapy: Secondary | ICD-10-CM | POA: Diagnosis not present

## 2017-05-30 DIAGNOSIS — M25552 Pain in left hip: Secondary | ICD-10-CM | POA: Insufficient documentation

## 2017-05-30 DIAGNOSIS — Z86718 Personal history of other venous thrombosis and embolism: Secondary | ICD-10-CM | POA: Diagnosis not present

## 2017-05-30 DIAGNOSIS — I130 Hypertensive heart and chronic kidney disease with heart failure and stage 1 through stage 4 chronic kidney disease, or unspecified chronic kidney disease: Secondary | ICD-10-CM | POA: Insufficient documentation

## 2017-05-30 DIAGNOSIS — G8929 Other chronic pain: Secondary | ICD-10-CM

## 2017-05-30 DIAGNOSIS — D8989 Other specified disorders involving the immune mechanism, not elsewhere classified: Secondary | ICD-10-CM

## 2017-05-30 DIAGNOSIS — Z9079 Acquired absence of other genital organ(s): Secondary | ICD-10-CM | POA: Diagnosis not present

## 2017-05-30 DIAGNOSIS — N189 Chronic kidney disease, unspecified: Secondary | ICD-10-CM

## 2017-05-30 LAB — COMPREHENSIVE METABOLIC PANEL
ALT: 15 U/L — ABNORMAL LOW (ref 17–63)
AST: 17 U/L (ref 15–41)
Albumin: 3.7 g/dL (ref 3.5–5.0)
Alkaline Phosphatase: 105 U/L (ref 38–126)
Anion gap: 10 (ref 5–15)
BUN: 47 mg/dL — ABNORMAL HIGH (ref 6–20)
CHLORIDE: 102 mmol/L (ref 101–111)
CO2: 26 mmol/L (ref 22–32)
Calcium: 9.7 mg/dL (ref 8.9–10.3)
Creatinine, Ser: 2.42 mg/dL — ABNORMAL HIGH (ref 0.61–1.24)
GFR, EST AFRICAN AMERICAN: 29 mL/min — AB (ref >60)
GFR, EST NON AFRICAN AMERICAN: 25 mL/min — AB (ref >60)
Glucose, Bld: 139 mg/dL — ABNORMAL HIGH (ref 65–99)
POTASSIUM: 5.1 mmol/L (ref 3.5–5.1)
SODIUM: 138 mmol/L (ref 135–145)
Total Bilirubin: 1 mg/dL (ref 0.3–1.2)
Total Protein: 7 g/dL (ref 6.5–8.1)

## 2017-05-30 LAB — CBC WITH DIFFERENTIAL/PLATELET
Basophils Absolute: 0 10*3/uL (ref 0–0.1)
Basophils Relative: 1 %
Eosinophils Absolute: 0.3 10*3/uL (ref 0–0.7)
Eosinophils Relative: 4 %
HEMATOCRIT: 28.3 % — AB (ref 40.0–52.0)
HEMOGLOBIN: 9.3 g/dL — AB (ref 13.0–18.0)
LYMPHS ABS: 2.9 10*3/uL (ref 1.0–3.6)
LYMPHS PCT: 38 %
MCH: 30.1 pg (ref 26.0–34.0)
MCHC: 32.9 g/dL (ref 32.0–36.0)
MCV: 91.4 fL (ref 80.0–100.0)
Monocytes Absolute: 0.7 10*3/uL (ref 0.2–1.0)
Monocytes Relative: 10 %
NEUTROS ABS: 3.7 10*3/uL (ref 1.4–6.5)
NEUTROS PCT: 49 %
Platelets: 221 10*3/uL (ref 150–440)
RBC: 3.09 MIL/uL — AB (ref 4.40–5.90)
RDW: 13 % (ref 11.5–14.5)
WBC: 7.6 10*3/uL (ref 3.8–10.6)

## 2017-05-30 NOTE — Progress Notes (Signed)
No new changes patient c/o of pain to left hip ( but states he has had this pain x 4 yrs with no relief.

## 2017-05-30 NOTE — Progress Notes (Signed)
Hematology/Oncology Consult note [facility]  [phone] [phone]  Patient Care Team: Cletis Athens, MD as PCP - General (Internal Medicine)   Name of the patient: Jesse Dickson  [id number]  19-Oct-1940   Date of visit: 05/30/17  Diagnosis- 1. Anemia of chronic kidney disease  2. 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 Holley Raring for chronic kidney disease. He has type 2 diabetes, anemia of chronic kidney disease and stage III CKD along with hypertension and secondary hyperparathyroidism. In January 2018 hishemoglobin was10.7. Iron saturation 20% serum creatinine was elevated at 1.64 CBC in 08/05/2016 showed white count of 6.4, H&H of 9.1/28.6 with an MCV of 91 and a platelet count of 210. Serum creatinine elevated at 1.4. Patient also noted to have M spike in his urine of13.2%  Results of bloodwork from 09/13/2016 were as follows: CBC showed white count of 6.7, H&H of 9.6/28.5 and a platelet count of 238. CMP was significant for elevated BUN of 34 and creatinine of 1.99. Blood sugar was elevated at 154. TSH was within normal limits. B12 folate and iron studies were within normal limits. Ferritin was high normal at 371. Haptoglobin was elevated at 432. Multiple myeloma panel revealed a monoclonal free lambda light chains. Quantitative immunoglobulins are within normal limits. Serum free lambda Light chain ratio was elevated at 50. Random urine protein electrophoresis and immunofixation revealed Bence Jones protein positive lambda type. M spike of 14.6% noted total protein in the random sample was 110 mg/dL  Bone marrow biopsy showed slightly increased number of plasma cells about 6% with lack of large aggregate partially. Immunohistochemical stain showed plasma cells are lambda  light chain restricted consistent with a plasma cell neoplasm. No molecular cytogenetic abnormalities were noted. 24-hour urine protein revealed 574 mg of protein and 91 mg of M spike. Immunofixation in the urine showed Bence Jones protein positive lambda type  Bone survey did not show any lytic lesions. PET CT in Jan 2019 did not reveal any lytic lesions or findings of malignancy    Interval history- reports chronic low bak and left hip pain that has remained unchanged. Mild chronic fatigue. Denies other complaints  ECOG PS- 1 Pain scale- 9  Review of systems- Review of Systems  Constitutional: Negative for chills, fever, malaise/fatigue and weight loss.  HENT: Negative for congestion, ear discharge and nosebleeds.   Eyes: Negative for blurred vision.  Respiratory: Negative for cough, hemoptysis, sputum production, shortness of breath and wheezing.   Cardiovascular: Negative for chest pain, palpitations, orthopnea and claudication.  Gastrointestinal: Negative for abdominal pain, blood in stool, constipation, diarrhea, heartburn, melena, nausea and vomiting.  Genitourinary: Negative for dysuria, flank pain, frequency, hematuria and urgency.  Musculoskeletal: Positive for back pain. Negative for joint pain and myalgias.  Skin: Negative for rash.  Neurological: Negative for dizziness, tingling, focal weakness, seizures, weakness and headaches.  Endo/Heme/Allergies: Does not bruise/bleed easily.  Psychiatric/Behavioral: Negative for depression and suicidal ideas. The patient does not have insomnia.        No Known Allergies   Past Medical History:  Diagnosis Date  . CHF (congestive heart failure) ([place])   . Diabetes mellitus without complication ([place])   . DVT (deep venous thrombosis) ([place])   . GERD (gastroesophageal reflux disease)   . Hypercholesteremia   . Hypertension   .  Prostate CA ([place]) 2015   only surgery per pt     Past Surgical History:  Procedure Laterality Date    . PROSTATECTOMY      Social History   Socioeconomic History  . Marital status: Married    Spouse name: Not on file  . Number of children: Not on file  . Years of education: Not on file  . Highest education level: Not on file  Occupational History  . Not on file  Social Needs  . Financial resource strain: Not on file  . Food insecurity:    Worry: Not on file    Inability: Not on file  . Transportation needs:    Medical: Not on file    Non-medical: Not on file  Tobacco Use  . Smoking status: Never Smoker  . Smokeless tobacco: Never Used  Substance and Sexual Activity  . Alcohol use: No    Frequency: Never  . Drug use: No  . Sexual activity: Never  Lifestyle  . Physical activity:    Days per week: Not on file    Minutes per session: Not on file  . Stress: Not on file  Relationships  . Social connections:    Talks on phone: Not on file    Gets together: Not on file    Attends religious service: Not on file    Active member of club or organization: Not on file    Attends meetings of clubs or organizations: Not on file    Relationship status: Not on file  . Intimate partner violence:    Fear of current or ex partner: Not on file    Emotionally abused: Not on file    Physically abused: Not on file    Forced sexual activity: Not on file  Other Topics Concern  . Not on file  Social History Narrative  . Not on file    Family History  Problem Relation Age of Onset  . Diabetes Mother   . COPD Mother   . Cancer Sister   . Cancer Sister   . Dementia Sister      Current Outpatient Medications:  .  aspirin EC 81 MG tablet, Take by mouth., Disp: , Rfl:  .  atorvastatin (LIPITOR) 20 MG tablet, Take 20 mg by mouth daily., Disp: , Rfl:  .  calcitRIOL (ROCALTROL) 0.25 MCG capsule, Take 0.25 mcg by mouth daily., Disp: , Rfl: 6 .  carvedilol (COREG) 25 MG tablet, Take 25 mg by mouth 2 (two) times daily., Disp: , Rfl:  .  docusate sodium (COLACE) 100 MG capsule, Take 100  mg by mouth 2 (two) times daily., Disp: , Rfl:  .  famotidine (PEPCID) 20 MG tablet, Take by mouth., Disp: , Rfl:  .  furosemide (LASIX) 40 MG tablet, Take 40 mg by mouth 2 (two) times daily., Disp: , Rfl:  .  gabapentin (NEURONTIN) 100 MG capsule, Take 200 mg by mouth daily., Disp: , Rfl: 2 .  insulin glargine (LANTUS) 100 UNIT/ML injection, Inject 25 Units into the skin at bedtime. , Disp: , Rfl:  .  Insulin Syringe-Needle U-100 (INSULIN SYRINGE 1CC/30GX5/16") 30G X 5/16" 1 ML MISC, Use 1 Dose 4 (four) times daily., Disp: , Rfl:  .  linaclotide (LINZESS) 290 MCG CAPS capsule, Take 1 capsule (290 mcg total) by mouth daily before breakfast., Disp: 59 capsule, Rfl: 2 .  ONETOUCH VERIO test strip, CHECK DAILY, Disp: , Rfl: 4 .  polyethylene glycol (MIRALAX / GLYCOLAX) packet, Take by  mouth., Disp: , Rfl:  .  tamsulosin (FLOMAX) 0.4 MG CAPS capsule, Take 0.4 mg by mouth at bedtime., Disp: , Rfl:  .  traMADol (ULTRAM) 50 MG tablet, Take 1 tablet (50 mg total) by mouth every 6 (six) hours as needed., Disp: 20 tablet, Rfl: 0 .  TRESIBA FLEXTOUCH 100 UNIT/ML SOPN FlexTouch Pen, INJECT 30UNITS WITH AUTO-INJECTOR ONCE DAILY, Disp: , Rfl: 6 .  VENTOLIN HFA 108 (90 Base) MCG/ACT inhaler, INHALE 1 PUFF BY MOUTH 3 TIMES A DAY AS NEEDED, Disp: , Rfl: 8  Physical exam:  Vitals:   05/30/17 1416 05/30/17 1421  BP: (!) 97/49   Pulse: 67   Temp: 97.6 F (36.4 C)   TempSrc: Tympanic   SpO2:  99%  Weight: 192 lb (87.1 kg)   Height: '5\' 8"'$  (1.727 m)    Physical Exam  Constitutional: He is oriented to person, place, and time and well-developed, well-nourished, and in no distress.  Ambulates with a cane  HENT:  Head: Normocephalic and atraumatic.  Eyes: Pupils are equal, round, and reactive to light. EOM are normal.  Neck: Normal range of motion.  Cardiovascular: Normal rate, regular rhythm and normal heart sounds.  Pulmonary/Chest: Effort normal and breath sounds normal.  Abdominal: Soft. Bowel sounds are  normal.  Neurological: He is alert and oriented to person, place, and time.  Skin: Skin is warm and dry.     CMP Latest Ref Rng & Units 05/30/2017  Glucose 65 - 99 mg/dL 139(H)  BUN 6 - 20 mg/dL 47(H)  Creatinine 0.61 - 1.24 mg/dL 2.42(H)  Sodium 135 - 145 mmol/L 138  Potassium 3.5 - 5.1 mmol/L 5.1  Chloride 101 - 111 mmol/L 102  CO2 22 - 32 mmol/L 26  Calcium 8.9 - 10.3 mg/dL 9.7  Total Protein 6.5 - 8.1 g/dL 7.0  Total Bilirubin 0.3 - 1.2 mg/dL 1.0  Alkaline Phos 38 - 126 U/L 105  AST 15 - 41 U/L 17  ALT 17 - 63 U/L 15(L)   CBC Latest Ref Rng & Units 05/30/2017  WBC 3.8 - 10.6 K/uL 7.6  Hemoglobin 13.0 - 18.0 g/dL 9.3(L)  Hematocrit 40.0 - 52.0 % 28.3(L)  Platelets 150 - 440 K/uL 221      Assessment and plan- Patient is a 77 y.o. male  with possible light chain disease  Hb stable around 9 since June 2018. Kidney functions are mildly worse. Myeloma panel in the past showed no M protein on spep but noted to have free light chains on IFE. He does have significantly elevated free lambda light chain of 1700 with lambda to kappa ratio of 50 that has remained unchanged. Myeloma panel anf FLC pending from today. Bone marrow biopsy in the past showed 6% plasma cells. PET/CT showed no lytic lesions. He still does not meet criteria for overt multiple myeloma  If FLC ratio is worse, I will consider repeat BM bx. He will meet criteria if ratio >100  Repeat CBC, CMP, myeloma panel and serum free light chains in 3 months in 6 months.  I will see him back in 6 months.  I will also do a 24-hour urine protein electrophoresis and immunofixation in 3 months   Visit Diagnosis 1. Light chain disease ([place])   2. Anemia of chronic renal failure, unspecified CKD stage      Dr. Randa Evens, MD, MPH [facility] Pager- [phone] 05/30/2017 3:33 PM

## 2017-05-31 DIAGNOSIS — R69 Illness, unspecified: Secondary | ICD-10-CM | POA: Diagnosis not present

## 2017-05-31 LAB — KAPPA/LAMBDA LIGHT CHAINS
KAPPA, LAMDA LIGHT CHAIN RATIO: 0.03 — AB (ref 0.26–1.65)
Kappa free light chain: 35.2 mg/L — ABNORMAL HIGH (ref 3.3–19.4)
LAMDA FREE LIGHT CHAINS: 1364.6 mg/L — AB (ref 5.7–26.3)

## 2017-06-01 LAB — MULTIPLE MYELOMA PANEL, SERUM
ALBUMIN/GLOB SERPL: 1 (ref 0.7–1.7)
Albumin SerPl Elph-Mcnc: 3.3 g/dL (ref 2.9–4.4)
Alpha 1: 0.2 g/dL (ref 0.0–0.4)
Alpha2 Glob SerPl Elph-Mcnc: 1.2 g/dL — ABNORMAL HIGH (ref 0.4–1.0)
B-Globulin SerPl Elph-Mcnc: 0.9 g/dL (ref 0.7–1.3)
GAMMA GLOB SERPL ELPH-MCNC: 1 g/dL (ref 0.4–1.8)
GLOBULIN, TOTAL: 3.4 g/dL (ref 2.2–3.9)
IGA: 133 mg/dL (ref 61–437)
IgG (Immunoglobin G), Serum: 1137 mg/dL (ref 700–1600)
IgM (Immunoglobulin M), Srm: 34 mg/dL (ref 15–143)
Total Protein ELP: 6.7 g/dL (ref 6.0–8.5)

## 2017-06-05 DIAGNOSIS — E1122 Type 2 diabetes mellitus with diabetic chronic kidney disease: Secondary | ICD-10-CM | POA: Diagnosis not present

## 2017-06-05 DIAGNOSIS — N183 Chronic kidney disease, stage 3 (moderate): Secondary | ICD-10-CM | POA: Diagnosis not present

## 2017-06-05 DIAGNOSIS — I10 Essential (primary) hypertension: Secondary | ICD-10-CM | POA: Diagnosis not present

## 2017-06-05 DIAGNOSIS — N2581 Secondary hyperparathyroidism of renal origin: Secondary | ICD-10-CM | POA: Diagnosis not present

## 2017-06-05 DIAGNOSIS — D631 Anemia in chronic kidney disease: Secondary | ICD-10-CM | POA: Diagnosis not present

## 2017-06-12 DIAGNOSIS — K219 Gastro-esophageal reflux disease without esophagitis: Secondary | ICD-10-CM | POA: Diagnosis not present

## 2017-06-12 DIAGNOSIS — K5900 Constipation, unspecified: Secondary | ICD-10-CM | POA: Diagnosis not present

## 2017-06-12 DIAGNOSIS — E785 Hyperlipidemia, unspecified: Secondary | ICD-10-CM | POA: Diagnosis not present

## 2017-06-12 DIAGNOSIS — E11630 Type 2 diabetes mellitus with periodontal disease: Secondary | ICD-10-CM | POA: Diagnosis not present

## 2017-06-12 DIAGNOSIS — E669 Obesity, unspecified: Secondary | ICD-10-CM | POA: Diagnosis not present

## 2017-06-12 DIAGNOSIS — R32 Unspecified urinary incontinence: Secondary | ICD-10-CM | POA: Diagnosis not present

## 2017-06-12 DIAGNOSIS — I509 Heart failure, unspecified: Secondary | ICD-10-CM | POA: Diagnosis not present

## 2017-06-12 DIAGNOSIS — Z794 Long term (current) use of insulin: Secondary | ICD-10-CM | POA: Diagnosis not present

## 2017-06-12 DIAGNOSIS — G8929 Other chronic pain: Secondary | ICD-10-CM | POA: Diagnosis not present

## 2017-06-12 DIAGNOSIS — E1142 Type 2 diabetes mellitus with diabetic polyneuropathy: Secondary | ICD-10-CM | POA: Diagnosis not present

## 2017-06-26 ENCOUNTER — Other Ambulatory Visit: Payer: Self-pay | Admitting: Internal Medicine

## 2017-06-26 ENCOUNTER — Other Ambulatory Visit
Admission: RE | Admit: 2017-06-26 | Discharge: 2017-06-26 | Disposition: A | Discharge status: Home / Self Care | Payer: Medicare HMO | Source: Ambulatory Visit | Attending: Internal Medicine | Admitting: Internal Medicine

## 2017-06-26 ENCOUNTER — Ambulatory Visit
Admission: RE | Admit: 2017-06-26 | Discharge: 2017-06-26 | Disposition: A | Discharge status: Home / Self Care | Payer: Medicare HMO | Source: Ambulatory Visit | Attending: Internal Medicine | Admitting: Internal Medicine

## 2017-06-26 DIAGNOSIS — R079 Chest pain, unspecified: Secondary | ICD-10-CM | POA: Diagnosis not present

## 2017-06-26 DIAGNOSIS — J90 Pleural effusion, not elsewhere classified: Secondary | ICD-10-CM | POA: Diagnosis not present

## 2017-06-26 DIAGNOSIS — I509 Heart failure, unspecified: Secondary | ICD-10-CM

## 2017-06-26 DIAGNOSIS — J9811 Atelectasis: Secondary | ICD-10-CM | POA: Diagnosis not present

## 2017-06-26 DIAGNOSIS — R0602 Shortness of breath: Secondary | ICD-10-CM | POA: Diagnosis not present

## 2017-06-26 DIAGNOSIS — R140 Abdominal distension (gaseous): Secondary | ICD-10-CM | POA: Diagnosis not present

## 2017-06-26 DIAGNOSIS — R531 Weakness: Secondary | ICD-10-CM | POA: Insufficient documentation

## 2017-06-26 DIAGNOSIS — E119 Type 2 diabetes mellitus without complications: Secondary | ICD-10-CM | POA: Diagnosis not present

## 2017-06-26 LAB — BASIC METABOLIC PANEL
Anion gap: 8 (ref 5–15)
BUN: 35 mg/dL — ABNORMAL HIGH (ref 6–20)
CO2: 28 mmol/L (ref 22–32)
Calcium: 8.8 mg/dL — ABNORMAL LOW (ref 8.9–10.3)
Chloride: 102 mmol/L (ref 101–111)
Creatinine, Ser: 2.05 mg/dL — ABNORMAL HIGH (ref 0.61–1.24)
GFR calc Af Amer: 35 mL/min — ABNORMAL LOW (ref >60)
GFR calc non Af Amer: 30 mL/min — ABNORMAL LOW (ref >60)
Glucose, Bld: 230 mg/dL — ABNORMAL HIGH (ref 65–99)
Potassium: 4.7 mmol/L (ref 3.5–5.1)
Sodium: 138 mmol/L (ref 135–145)

## 2017-06-26 LAB — CBC
HCT: 27.4 % — ABNORMAL LOW (ref 40.0–52.0)
Hemoglobin: 9.1 g/dL — ABNORMAL LOW (ref 13.0–18.0)
MCH: 30 pg (ref 26.0–34.0)
MCHC: 33.1 g/dL (ref 32.0–36.0)
MCV: 90.8 fL (ref 80.0–100.0)
Platelets: 229 10*3/uL (ref 150–440)
RBC: 3.02 MIL/uL — ABNORMAL LOW (ref 4.40–5.90)
RDW: 13.7 % (ref 11.5–14.5)
WBC: 7.2 10*3/uL (ref 3.8–10.6)

## 2017-06-26 LAB — VITAMIN B12: Vitamin B-12: 648 pg/mL (ref 180–914)

## 2017-06-26 LAB — FERRITIN: Ferritin: 258 ng/mL (ref 24–336)

## 2017-06-26 LAB — TSH: TSH: 1.723 u[IU]/mL (ref 0.350–4.500)

## 2017-06-26 LAB — BRAIN NATRIURETIC PEPTIDE: B Natriuretic Peptide: 332 pg/mL — ABNORMAL HIGH (ref 0.0–100.0)

## 2017-06-27 DIAGNOSIS — C61 Malignant neoplasm of prostate: Secondary | ICD-10-CM | POA: Diagnosis not present

## 2017-06-27 DIAGNOSIS — I10 Essential (primary) hypertension: Secondary | ICD-10-CM | POA: Diagnosis not present

## 2017-06-27 DIAGNOSIS — R140 Abdominal distension (gaseous): Secondary | ICD-10-CM | POA: Diagnosis not present

## 2017-06-27 DIAGNOSIS — R0602 Shortness of breath: Secondary | ICD-10-CM | POA: Diagnosis not present

## 2017-06-27 LAB — HEMOGLOBIN A1C
Hgb A1c MFr Bld: 6.9 % — ABNORMAL HIGH (ref 4.8–5.6)
Mean Plasma Glucose: 151.33 mg/dL

## 2017-06-28 ENCOUNTER — Other Ambulatory Visit: Payer: Self-pay

## 2017-06-28 ENCOUNTER — Encounter: Payer: Self-pay | Admitting: Emergency Medicine

## 2017-06-28 ENCOUNTER — Observation Stay
Admission: EM | Admit: 2017-06-28 | Discharge: 2017-06-30 | Disposition: A | Discharge status: Home / Self Care | Payer: Medicare HMO | Attending: Internal Medicine | Admitting: Internal Medicine

## 2017-06-28 ENCOUNTER — Emergency Department: Payer: Medicare HMO

## 2017-06-28 ENCOUNTER — Observation Stay (HOSPITAL_BASED_OUTPATIENT_CLINIC_OR_DEPARTMENT_OTHER)
Admit: 2017-06-28 | Discharge: 2017-06-28 | Disposition: A | Discharge status: Home / Self Care | Payer: Medicare HMO | Attending: Cardiovascular Disease | Admitting: Cardiovascular Disease

## 2017-06-28 DIAGNOSIS — I469 Cardiac arrest, cause unspecified: Secondary | ICD-10-CM | POA: Diagnosis not present

## 2017-06-28 DIAGNOSIS — Z7982 Long term (current) use of aspirin: Secondary | ICD-10-CM | POA: Diagnosis not present

## 2017-06-28 DIAGNOSIS — I959 Hypotension, unspecified: Secondary | ICD-10-CM | POA: Diagnosis not present

## 2017-06-28 DIAGNOSIS — E785 Hyperlipidemia, unspecified: Secondary | ICD-10-CM | POA: Insufficient documentation

## 2017-06-28 DIAGNOSIS — Z794 Long term (current) use of insulin: Secondary | ICD-10-CM | POA: Diagnosis not present

## 2017-06-28 DIAGNOSIS — R001 Bradycardia, unspecified: Secondary | ICD-10-CM | POA: Insufficient documentation

## 2017-06-28 DIAGNOSIS — R55 Syncope and collapse: Secondary | ICD-10-CM | POA: Diagnosis not present

## 2017-06-28 DIAGNOSIS — E119 Type 2 diabetes mellitus without complications: Secondary | ICD-10-CM | POA: Diagnosis not present

## 2017-06-28 DIAGNOSIS — D631 Anemia in chronic kidney disease: Secondary | ICD-10-CM | POA: Diagnosis not present

## 2017-06-28 DIAGNOSIS — Z8546 Personal history of malignant neoplasm of prostate: Secondary | ICD-10-CM | POA: Diagnosis not present

## 2017-06-28 DIAGNOSIS — I10 Essential (primary) hypertension: Secondary | ICD-10-CM | POA: Diagnosis not present

## 2017-06-28 DIAGNOSIS — K5900 Constipation, unspecified: Secondary | ICD-10-CM | POA: Diagnosis not present

## 2017-06-28 DIAGNOSIS — R42 Dizziness and giddiness: Secondary | ICD-10-CM | POA: Diagnosis not present

## 2017-06-28 DIAGNOSIS — R079 Chest pain, unspecified: Secondary | ICD-10-CM | POA: Diagnosis present

## 2017-06-28 DIAGNOSIS — Z86718 Personal history of other venous thrombosis and embolism: Secondary | ICD-10-CM | POA: Insufficient documentation

## 2017-06-28 DIAGNOSIS — E1122 Type 2 diabetes mellitus with diabetic chronic kidney disease: Secondary | ICD-10-CM | POA: Diagnosis not present

## 2017-06-28 DIAGNOSIS — E118 Type 2 diabetes mellitus with unspecified complications: Secondary | ICD-10-CM

## 2017-06-28 DIAGNOSIS — R748 Abnormal levels of other serum enzymes: Secondary | ICD-10-CM | POA: Insufficient documentation

## 2017-06-28 DIAGNOSIS — D649 Anemia, unspecified: Secondary | ICD-10-CM | POA: Insufficient documentation

## 2017-06-28 DIAGNOSIS — J90 Pleural effusion, not elsewhere classified: Secondary | ICD-10-CM | POA: Diagnosis not present

## 2017-06-28 DIAGNOSIS — I130 Hypertensive heart and chronic kidney disease with heart failure and stage 1 through stage 4 chronic kidney disease, or unspecified chronic kidney disease: Secondary | ICD-10-CM | POA: Diagnosis not present

## 2017-06-28 DIAGNOSIS — I5032 Chronic diastolic (congestive) heart failure: Secondary | ICD-10-CM | POA: Diagnosis not present

## 2017-06-28 DIAGNOSIS — Z79899 Other long term (current) drug therapy: Secondary | ICD-10-CM | POA: Diagnosis not present

## 2017-06-28 DIAGNOSIS — E7800 Pure hypercholesterolemia, unspecified: Secondary | ICD-10-CM | POA: Diagnosis not present

## 2017-06-28 DIAGNOSIS — I5033 Acute on chronic diastolic (congestive) heart failure: Secondary | ICD-10-CM | POA: Diagnosis not present

## 2017-06-28 DIAGNOSIS — K219 Gastro-esophageal reflux disease without esophagitis: Secondary | ICD-10-CM | POA: Diagnosis not present

## 2017-06-28 DIAGNOSIS — N183 Chronic kidney disease, stage 3 (moderate): Secondary | ICD-10-CM | POA: Insufficient documentation

## 2017-06-28 DIAGNOSIS — N184 Chronic kidney disease, stage 4 (severe): Secondary | ICD-10-CM | POA: Diagnosis not present

## 2017-06-28 DIAGNOSIS — R296 Repeated falls: Secondary | ICD-10-CM | POA: Insufficient documentation

## 2017-06-28 DIAGNOSIS — E782 Mixed hyperlipidemia: Secondary | ICD-10-CM | POA: Diagnosis not present

## 2017-06-28 DIAGNOSIS — I447 Left bundle-branch block, unspecified: Secondary | ICD-10-CM | POA: Insufficient documentation

## 2017-06-28 DIAGNOSIS — K5909 Other constipation: Secondary | ICD-10-CM | POA: Insufficient documentation

## 2017-06-28 DIAGNOSIS — I639 Cerebral infarction, unspecified: Secondary | ICD-10-CM

## 2017-06-28 DIAGNOSIS — K5904 Chronic idiopathic constipation: Secondary | ICD-10-CM | POA: Diagnosis not present

## 2017-06-28 HISTORY — DX: Chronic kidney disease, stage 3 unspecified: N18.30

## 2017-06-28 HISTORY — DX: Chronic kidney disease, stage 3 (moderate): N18.3

## 2017-06-28 HISTORY — DX: Chronic diastolic (congestive) heart failure: I50.32

## 2017-06-28 HISTORY — DX: Monoclonal gammopathy: D47.2

## 2017-06-28 HISTORY — DX: Reserved for inherently not codable concepts without codable children: IMO0001

## 2017-06-28 HISTORY — DX: Type 2 diabetes mellitus without complications: E11.9

## 2017-06-28 HISTORY — DX: Long term (current) use of insulin: Z79.4

## 2017-06-28 LAB — CBC
HCT: 28.6 % — ABNORMAL LOW (ref 40.0–52.0)
Hemoglobin: 9.2 g/dL — ABNORMAL LOW (ref 13.0–18.0)
MCH: 29.2 pg (ref 26.0–34.0)
MCHC: 32.2 g/dL (ref 32.0–36.0)
MCV: 90.7 fL (ref 80.0–100.0)
PLATELETS: 237 10*3/uL (ref 150–440)
RBC: 3.15 MIL/uL — AB (ref 4.40–5.90)
RDW: 13.7 % (ref 11.5–14.5)
WBC: 6 10*3/uL (ref 3.8–10.6)

## 2017-06-28 LAB — BASIC METABOLIC PANEL
Anion gap: 4 — ABNORMAL LOW (ref 5–15)
BUN: 32 mg/dL — ABNORMAL HIGH (ref 6–20)
CALCIUM: 8.9 mg/dL (ref 8.9–10.3)
CHLORIDE: 104 mmol/L (ref 101–111)
CO2: 27 mmol/L (ref 22–32)
CREATININE: 1.83 mg/dL — AB (ref 0.61–1.24)
GFR calc non Af Amer: 35 mL/min — ABNORMAL LOW (ref >60)
GFR, EST AFRICAN AMERICAN: 40 mL/min — AB (ref >60)
GLUCOSE: 276 mg/dL — AB (ref 65–99)
Potassium: 4.7 mmol/L (ref 3.5–5.1)
Sodium: 135 mmol/L (ref 135–145)

## 2017-06-28 LAB — TSH: TSH: 1.542 u[IU]/mL (ref 0.350–4.500)

## 2017-06-28 LAB — TROPONIN I
TROPONIN I: 0.06 ng/mL — AB (ref <0.03)
Troponin I: 0.05 ng/mL (ref <0.03)
Troponin I: 0.05 ng/mL (ref <0.03)
Troponin I: 0.06 ng/mL (ref <0.03)

## 2017-06-28 LAB — GLUCOSE, CAPILLARY
Glucose-Capillary: 207 mg/dL — ABNORMAL HIGH (ref 65–99)
Glucose-Capillary: 215 mg/dL — ABNORMAL HIGH (ref 65–99)
Glucose-Capillary: 199 mg/dL — ABNORMAL HIGH (ref 65–99)
Glucose-Capillary: 104 mg/dL — ABNORMAL HIGH (ref 65–99)
Glucose-Capillary: 165 mg/dL — ABNORMAL HIGH (ref 65–99)

## 2017-06-28 LAB — BRAIN NATRIURETIC PEPTIDE: B Natriuretic Peptide: 362 pg/mL — ABNORMAL HIGH (ref 0.0–100.0)

## 2017-06-28 LAB — ECHOCARDIOGRAM COMPLETE
HEIGHTINCHES: 68 in
Weight: 3163.2 oz

## 2017-06-28 MED ORDER — GI COCKTAIL ~~LOC~~
30.0000 mL | Freq: Once | ORAL | Status: AC
  Administered 2017-06-28: 30 mL via ORAL
  Filled 2017-06-28: qty 30

## 2017-06-28 MED ORDER — ONDANSETRON HCL 4 MG PO TABS: 4.0000 mg | ORAL_TABLET | Freq: Four times a day (QID) | ORAL | Status: DC | PRN

## 2017-06-28 MED ORDER — INSULIN GLARGINE 100 UNIT/ML ~~LOC~~ SOLN
5.0000 [IU] | Freq: Every day | SUBCUTANEOUS | Status: DC
  Administered 2017-06-28 – 2017-06-29 (×2): 5 [IU] via SUBCUTANEOUS
  Filled 2017-06-28 (×3): qty 0.05

## 2017-06-28 MED ORDER — CARVEDILOL 25 MG PO TABS
25.0000 mg | ORAL_TABLET | Freq: Two times a day (BID) | ORAL | Status: DC
  Administered 2017-06-28: 25 mg via ORAL
  Filled 2017-06-28: qty 1

## 2017-06-28 MED ORDER — LINACLOTIDE 290 MCG PO CAPS
290.0000 ug | ORAL_CAPSULE | Freq: Every day | ORAL | Status: DC
  Administered 2017-06-28 – 2017-06-30 (×3): 290 ug via ORAL
  Filled 2017-06-28 (×3): qty 1

## 2017-06-28 MED ORDER — INSULIN ASPART 100 UNIT/ML ~~LOC~~ SOLN
0.0000 [IU] | Freq: Three times a day (TID) | SUBCUTANEOUS | Status: DC
  Administered 2017-06-28: 2 [IU] via SUBCUTANEOUS
  Administered 2017-06-28: 3 [IU] via SUBCUTANEOUS
  Administered 2017-06-29 – 2017-06-30 (×2): 1 [IU] via SUBCUTANEOUS
  Filled 2017-06-28 (×4): qty 1

## 2017-06-28 MED ORDER — INSULIN GLARGINE 100 UNIT/ML ~~LOC~~ SOLN
18.0000 [IU] | Freq: Every day | SUBCUTANEOUS | Status: DC
  Filled 2017-06-28: qty 0.18

## 2017-06-28 MED ORDER — ATORVASTATIN CALCIUM 20 MG PO TABS
20.0000 mg | ORAL_TABLET | Freq: Every day | ORAL | Status: DC
  Administered 2017-06-28 – 2017-06-29 (×2): 20 mg via ORAL
  Filled 2017-06-28 (×2): qty 1

## 2017-06-28 MED ORDER — CARVEDILOL 12.5 MG PO TABS
12.5000 mg | ORAL_TABLET | Freq: Two times a day (BID) | ORAL | Status: DC
  Administered 2017-06-28: 12.5 mg via ORAL
  Filled 2017-06-28: qty 1

## 2017-06-28 MED ORDER — ASPIRIN EC 81 MG PO TBEC
81.0000 mg | DELAYED_RELEASE_TABLET | Freq: Every day | ORAL | Status: DC
  Administered 2017-06-28 – 2017-06-30 (×3): 81 mg via ORAL
  Filled 2017-06-28 (×3): qty 1

## 2017-06-28 MED ORDER — GABAPENTIN 100 MG PO CAPS
200.0000 mg | ORAL_CAPSULE | Freq: Every day | ORAL | Status: DC
  Administered 2017-06-28 – 2017-06-30 (×3): 200 mg via ORAL
  Filled 2017-06-28 (×3): qty 2

## 2017-06-28 MED ORDER — POLYETHYLENE GLYCOL 3350 17 G PO PACK: 17.0000 g | PACK | Freq: Every day | ORAL | Status: DC | PRN

## 2017-06-28 MED ORDER — HEPARIN SODIUM (PORCINE) 5000 UNIT/ML IJ SOLN
5000.0000 [IU] | Freq: Three times a day (TID) | INTRAMUSCULAR | Status: DC
  Administered 2017-06-28 – 2017-06-30 (×7): 5000 [IU] via SUBCUTANEOUS
  Filled 2017-06-28 (×7): qty 1

## 2017-06-28 MED ORDER — CALCITRIOL 0.25 MCG PO CAPS
0.2500 ug | ORAL_CAPSULE | Freq: Every day | ORAL | Status: DC
  Administered 2017-06-28 – 2017-06-30 (×3): 0.25 ug via ORAL
  Filled 2017-06-28 (×3): qty 1

## 2017-06-28 MED ORDER — ACETAMINOPHEN 325 MG PO TABS: 650.0000 mg | ORAL_TABLET | Freq: Four times a day (QID) | ORAL | Status: DC | PRN

## 2017-06-28 MED ORDER — TAMSULOSIN HCL 0.4 MG PO CAPS
0.4000 mg | ORAL_CAPSULE | Freq: Every day | ORAL | Status: DC
  Administered 2017-06-28 – 2017-06-29 (×2): 0.4 mg via ORAL
  Filled 2017-06-28 (×2): qty 1

## 2017-06-28 MED ORDER — FUROSEMIDE 10 MG/ML IJ SOLN
40.0000 mg | Freq: Once | INTRAMUSCULAR | Status: AC
  Administered 2017-06-28: 40 mg via INTRAVENOUS
  Filled 2017-06-28: qty 4

## 2017-06-28 MED ORDER — FAMOTIDINE 20 MG PO TABS
20.0000 mg | ORAL_TABLET | Freq: Every day | ORAL | Status: DC
  Administered 2017-06-28 – 2017-06-30 (×3): 20 mg via ORAL
  Filled 2017-06-28 (×3): qty 1

## 2017-06-28 MED ORDER — ACETAMINOPHEN 650 MG RE SUPP: 650.0000 mg | Freq: Four times a day (QID) | RECTAL | Status: DC | PRN

## 2017-06-28 MED ORDER — FUROSEMIDE 40 MG PO TABS
40.0000 mg | ORAL_TABLET | Freq: Two times a day (BID) | ORAL | Status: DC
  Administered 2017-06-28: 40 mg via ORAL
  Filled 2017-06-28: qty 1

## 2017-06-28 MED ORDER — ALBUTEROL SULFATE (2.5 MG/3ML) 0.083% IN NEBU: 2.5000 mg | INHALATION_SOLUTION | RESPIRATORY_TRACT | Status: DC | PRN

## 2017-06-28 MED ORDER — LOSARTAN POTASSIUM 25 MG PO TABS
25.0000 mg | ORAL_TABLET | Freq: Every day | ORAL | Status: DC
  Administered 2017-06-28: 25 mg via ORAL
  Filled 2017-06-28 (×2): qty 1

## 2017-06-28 MED ORDER — ONDANSETRON HCL 4 MG/2ML IJ SOLN: 4.0000 mg | Freq: Four times a day (QID) | INTRAMUSCULAR | Status: DC | PRN

## 2017-06-28 MED ORDER — DOCUSATE SODIUM 100 MG PO CAPS
100.0000 mg | ORAL_CAPSULE | Freq: Two times a day (BID) | ORAL | Status: DC
  Administered 2017-06-28 – 2017-06-30 (×5): 100 mg via ORAL
  Filled 2017-06-28 (×5): qty 1

## 2017-06-28 MED ORDER — NITROGLYCERIN 2 % TD OINT
1.0000 [in_us] | TOPICAL_OINTMENT | Freq: Once | TRANSDERMAL | Status: AC
  Administered 2017-06-28: 0.5 [in_us] via TOPICAL
  Filled 2017-06-28: qty 1

## 2017-06-28 MED ORDER — MORPHINE SULFATE (PF) 2 MG/ML IV SOLN
1.0000 mg | Freq: Once | INTRAVENOUS | Status: AC
  Administered 2017-06-28: 1 mg via INTRAVENOUS
  Filled 2017-06-28: qty 1

## 2017-06-28 NOTE — ED Notes (Signed)
Medications on hold until IV established.

## 2017-06-28 NOTE — Progress Notes (Signed)
Patient brandying down HR as low as 32 but not sustaining BP 109/64, per patient feels dizzy. Dr. Rockey Situ notified, no new orders received. Will continue to monitor.

## 2017-06-28 NOTE — ED Provider Notes (Signed)
[facility] Emergency Department Provider Note   ____________________________________________   First MD Initiated Contact with Patient 06/28/17 [phone]     (approximate)  I have reviewed the triage vital signs and the nursing notes.   HISTORY  Chief Complaint Chest Pain    HPI Jesse Dickson is a 77 y.o. male who comes into the hospital today with some chest pain.  The pain started about 2 weeks ago.  The pain is in the patient's mid chest and occasionally radiates to his left shoulder.  The patient went to his doctor's a couple of days ago and had an x-ray and some blood work.  They told him that he had some fluid in his lungs.  Tonight the patient was walking across the room when he fainted.  His family decided to bring him in for evaluation.  He was not out for long and he did not hit his head.  He has not had any vomiting or headache.  He has had some shortness of breath with an occasional cough.  He rates his pain a 9 out of 10 in intensity.  He has no abdominal pain.  The patient is here today for evaluation.   Past Medical History:  Diagnosis Date  . CHF (congestive heart failure) ([place])   . CKD (chronic kidney disease)   . Diabetes mellitus without complication ([place])   . DVT (deep venous thrombosis) ([place])   . GERD (gastroesophageal reflux disease)   . Hypercholesteremia   . Hypertension   . MGUS (monoclonal gammopathy of unknown significance)   . Prostate CA ([place]) 2015   only surgery per pt    Patient Active Problem List   Diagnosis Date Noted  . Chest pain 06/28/2017  . Chronic diastolic congestive heart failure ([place]) 09/13/2016  . Chronic low back pain 09/13/2016  . GERD without esophagitis 09/13/2016  . Insulin dependent diabetes mellitus ([place]) 09/13/2016  . Pure hypercholesterolemia 09/13/2016  . Anemia of chronic disease 05/18/2015  . CKD (chronic kidney disease) stage 3, GFR 30-59 ml/min ([facility]) 05/18/2015    Past Surgical History:   Procedure Laterality Date  . PROSTATECTOMY      Prior to Admission medications   Medication Sig Start Date End Date Taking? Authorizing Provider  aspirin EC 81 MG tablet Take 81 mg by mouth daily.    Yes [provider]  atorvastatin (LIPITOR) 20 MG tablet Take 20 mg by mouth daily.   Yes [provider]  calcitRIOL (ROCALTROL) 0.25 MCG capsule Take 0.25 mcg by mouth daily. 11/13/16  Yes [provider]  carvedilol (COREG) 25 MG tablet Take 25 mg by mouth 2 (two) times daily.   Yes [provider]  docusate sodium (COLACE) 100 MG capsule Take 100 mg by mouth 2 (two) times daily.   Yes [provider]  famotidine (PEPCID) 20 MG tablet Take 20 mg by mouth daily.    Yes [provider]  furosemide (LASIX) 40 MG tablet Take 40 mg by mouth 2 (two) times daily.   Yes [provider]  gabapentin (NEURONTIN) 100 MG capsule Take 200 mg by mouth daily. 06/20/16  Yes [provider]  insulin glargine (LANTUS) 100 UNIT/ML injection Inject 25 Units into the skin at bedtime.    Yes [provider]  linaclotide Rolan Lipa) 290 MCG CAPS capsule Take 1 capsule (290 mcg total) by mouth daily before breakfast. 03/14/17 06/28/17 Yes Jonathon Bellows, MD  losartan (COZAAR) 25 MG tablet Take 25 mg by  mouth daily.   Yes [provider]  polyethylene glycol (MIRALAX / GLYCOLAX) packet Take 17 g by mouth daily as needed for mild constipation.    Yes [provider]  tamsulosin (FLOMAX) 0.4 MG CAPS capsule Take 0.4 mg by mouth at bedtime.   Yes [provider]  TRESIBA FLEXTOUCH 100 UNIT/ML SOPN FlexTouch Pen INJECT 30UNITS WITH AUTO-INJECTOR ONCE DAILY 02/14/17  Yes [provider]  VENTOLIN HFA 108 (90 Base) MCG/ACT inhaler INHALE 1 PUFF BY MOUTH 3 TIMES A DAY AS NEEDED 02/14/17  Yes [provider]  Insulin Syringe-Needle U-100 (INSULIN SYRINGE 1CC/30GX5/16") 30G X 5/16" 1 ML MISC Use 1 Dose 4 (four)  times daily. 09/10/15   [provider]  Roma Schanz test strip CHECK DAILY 01/21/17   [provider]  traMADol (ULTRAM) 50 MG tablet Take 1 tablet (50 mg total) by mouth every 6 (six) hours as needed. Patient not taking: Reported on 06/28/2017 02/18/15   Johnn Hai, PA-C    Allergies Patient has no known allergies.  Family History  Problem Relation Age of Onset  . Diabetes Mother   . COPD Mother   . Cancer Sister   . Cancer Sister   . Dementia Sister     Social History Social History   Tobacco Use  . Smoking status: Never Smoker  . Smokeless tobacco: Never Used  Substance Use Topics  . Alcohol use: No    Frequency: Never  . Drug use: No    Review of Systems  Constitutional: No fever/chills Eyes: No visual changes. ENT: No sore throat. Cardiovascular: chest pain. Respiratory:  shortness of breath. Gastrointestinal: No abdominal pain.  No nausea, no vomiting.  No diarrhea.  No constipation. Genitourinary: Negative for dysuria. Musculoskeletal: Negative for back pain. Skin: Negative for rash. Neurological: Negative for headaches, focal weakness or numbness.   ____________________________________________   PHYSICAL EXAM:  VITAL SIGNS: ED Triage Vitals  Enc Vitals Group     BP 06/28/17 0024 (!) 123/50     Pulse Rate 06/28/17 0024 66     Resp 06/28/17 0024 20     Temp 06/28/17 0024 97.7 F (36.5 C)     Temp Source 06/28/17 0024 Oral     SpO2 06/28/17 0024 98 %     Weight 06/28/17 0021 197 lb (89.4 kg)     Height 06/28/17 0021 5\' 8"  (1.727 m)     Head Circumference --      Peak Flow --      Pain Score 06/28/17 0021 9     Pain Loc --      Pain Edu? --      Excl. in [place]? --     Constitutional: Alert and oriented. Well appearing and in moderate distress. Eyes: Conjunctivae are normal. PERRL. EOMI. Head: Atraumatic. Nose: No congestion/rhinnorhea. Mouth/Throat: Mucous membranes are moist.  Oropharynx  non-erythematous. Cardiovascular: Normal rate, regular rhythm. Grossly normal heart sounds.  Good peripheral circulation. Respiratory: Normal respiratory effort.  No retractions. Lungs CTAB. Gastrointestinal: Soft and nontender. No distention. Positive bowel sounds Musculoskeletal: bilateral lower extremity pitting edema   Neurologic:  Normal speech and language.  Skin:  Skin is warm, dry and intact.  Psychiatric: Mood and affect are normal.   ____________________________________________   LABS (all labs ordered are listed, but only abnormal results are displayed)  Labs Reviewed  BASIC METABOLIC PANEL - Abnormal; Notable for the following components:      Result Value   Glucose, Bld 276 (*)  BUN 32 (*)    Creatinine, Ser 1.83 (*)    GFR calc non Af Amer 35 (*)    GFR calc Af Amer 40 (*)    Anion gap 4 (*)    All other components within normal limits  CBC - Abnormal; Notable for the following components:   RBC 3.15 (*)    Hemoglobin 9.2 (*)    HCT 28.6 (*)    All other components within normal limits  TROPONIN I - Abnormal; Notable for the following components:   Troponin I 0.06 (*)    All other components within normal limits  BRAIN NATRIURETIC PEPTIDE - Abnormal; Notable for the following components:   B Natriuretic Peptide 362.0 (*)    All other components within normal limits   ____________________________________________  EKG  ED ECG REPORT I, Loney Hering, the attending physician, personally viewed and interpreted this ECG.   Date: 06/28/2017  EKG Time: 0022  Rate: 67  Rhythm: normal sinus rhythm, LBBB, 1st degree AV block  Axis: left axis deviation  Intervals:left bundle branch block  ST&T Change: LBBB   ____________________________________________  RADIOLOGY  ED MD interpretation:  CXR: Stable cardiomegaly with mild vascular congestion, bibasilar atelectasis and trace bilateral pleural effusions  Official radiology report(s): Dg Chest 2  View  Result Date: 06/28/2017 CLINICAL DATA:  Mid chest pain x2 weeks with dyspnea. EXAM: CHEST - 2 VIEW COMPARISON:  06/26/2017 FINDINGS: Stable mild cardiomegaly. Pulmonary vascular congestion with bibasilar atelectasis and trace bilateral pleural effusions unchanged in appearance. No acute osseous abnormality. IMPRESSION: Stable cardiomegaly with mild vascular congestion, bibasilar atelectasis and trace bilateral pleural effusions. No significant change from prior. Electronically Signed   By: Ashley Royalty M.D.   On: 06/28/2017 01:10    ____________________________________________   PROCEDURES  Procedure(s) performed: None  Procedures  Critical Care performed: No  ____________________________________________   INITIAL IMPRESSION / ASSESSMENT AND PLAN / ED COURSE  As part of my medical decision making, I reviewed the following data within the electronic MEDICAL RECORD NUMBER Notes from prior ED visits and [place] Controlled Substance Database   This is a 77 year old male who comes into the hospital today with some chest pain and shortness of breath.  My differential diagnosis includes acute coronary syndrome, congestive heart failure, pneumonia  We did check some blood work to include a CBC, BMP, troponin, BNP.  The patient's troponin is 0.06 and the patient's BNP is 362.  The patient's chest x-ray did show some cardiomegaly with mild vascular congestion as well as some bilateral pleural effusions.  Looking at the chest x-ray the patient's effusion is worse on the right than the left.  The patient received some Nitropaste to his chest as well as a GI cocktail. The patient will also receive some lasix. He will be admitted to the hospital for further evaluation.       ____________________________________________   FINAL CLINICAL IMPRESSION(S) / ED DIAGNOSES  Final diagnoses:  Chest pain, unspecified type  Pleural effusion  Syncope, unspecified syncope type     ED Discharge Orders     None       Note:  This document was prepared using Dragon voice recognition software and may include unintentional dictation errors.    Loney Hering, MD 06/28/17 [phone]

## 2017-06-28 NOTE — ED Notes (Signed)
Patient had several unsuccessful IV attempts by 3 different nurses.

## 2017-06-28 NOTE — Progress Notes (Signed)
[facility] NAME: Jesse Dickson    MR#:  [id number]  DATE OF BIRTH:  May 04, 1940  SUBJECTIVE:  CHIEF COMPLAINT:   Chief Complaint  Patient presents with  . Chest Pain     Came with SOB- also have some Bradycardia on tele.  REVIEW OF SYSTEMS:  CONSTITUTIONAL: No fever, fatigue or weakness.  EYES: No blurred or double vision.  EARS, NOSE, AND THROAT: No tinnitus or ear pain.  RESPIRATORY: No cough, have shortness of breath, wheezing or hemoptysis.  CARDIOVASCULAR: No chest pain, orthopnea, edema.  GASTROINTESTINAL: No nausea, vomiting, diarrhea or abdominal pain.  GENITOURINARY: No dysuria, hematuria.  ENDOCRINE: No polyuria, nocturia,  HEMATOLOGY: No anemia, easy bruising or bleeding SKIN: No rash or lesion. MUSCULOSKELETAL: No joint pain or arthritis.   NEUROLOGIC: No tingling, numbness, weakness.  PSYCHIATRY: No anxiety or depression.   ROS  DRUG ALLERGIES:  No Known Allergies  VITALS:  Blood pressure 113/61, pulse 65, temperature 98.1 F (36.7 C), temperature source Oral, resp. rate 18, height 5\' 8"  (1.727 m), weight 89.7 kg (197 lb 11.2 oz), SpO2 98 %.  PHYSICAL EXAMINATION:  GENERAL:  77 y.o.-year-old patient lying in the bed with no acute distress.  EYES: Pupils equal, round, reactive to light and accommodation. No scleral icterus. Extraocular muscles intact.  HEENT: Head atraumatic, normocephalic. Oropharynx and nasopharynx clear.  NECK:  Supple, no jugular venous distention. No thyroid enlargement, no tenderness.  LUNGS: Normal breath sounds bilaterally, no wheezing, some crepitation. No use of accessory muscles of respiration.  CARDIOVASCULAR: S1, S2 normal. No murmurs, rubs, or gallops.  ABDOMEN: Soft, nontender, nondistended. Bowel sounds present. No organomegaly or mass.  EXTREMITIES: No pedal edema, cyanosis, or clubbing.  NEUROLOGIC: Cranial nerves II through XII are intact. Muscle strength 5/5 in all  extremities. Sensation intact. Gait not checked.  PSYCHIATRIC: The patient is alert and oriented x 3.  SKIN: No obvious rash, lesion, or ulcer.   Physical Exam LABORATORY PANEL:   CBC Recent Labs  Lab 06/28/17 0023  WBC 6.0  HGB 9.2*  HCT 28.6*  PLT 237   ------------------------------------------------------------------------------------------------------------------  Chemistries  Recent Labs  Lab 06/28/17 0023  NA 135  K 4.7  CL 104  CO2 27  GLUCOSE 276*  BUN 32*  CREATININE 1.83*  CALCIUM 8.9   ------------------------------------------------------------------------------------------------------------------  Cardiac Enzymes Recent Labs  Lab 06/28/17 1021 06/28/17 1707  TROPONINI 0.05* 0.06*   ------------------------------------------------------------------------------------------------------------------  RADIOLOGY:  Dg Chest 2 View  Result Date: 06/28/2017 CLINICAL DATA:  Mid chest pain x2 weeks with dyspnea. EXAM: CHEST - 2 VIEW COMPARISON:  06/26/2017 FINDINGS: Stable mild cardiomegaly. Pulmonary vascular congestion with bibasilar atelectasis and trace bilateral pleural effusions unchanged in appearance. No acute osseous abnormality. IMPRESSION: Stable cardiomegaly with mild vascular congestion, bibasilar atelectasis and trace bilateral pleural effusions. No significant change from prior. Electronically Signed   By: Ashley Royalty M.D.   On: 06/28/2017 01:10    ASSESSMENT AND PLAN:   Active Problems:   Chest pain  This is a 77 year old male admitted for chest pain. 1.  Chest pain: Mild elevated troponin.  Continue to monitor telemetry.  Follow cardiac biomarkers.  Consulted cardiology.    Continue aspirin   Echo. 2.  CHF: Diastolic; acute on chronic.  Has bilateral pleural effusions as well as trace nonpitting edema of the lower extremities.   scheduled Lasix IV.  Daily weights.  Continue carvedilol and losartan 3.  CKD: Stage III; avoid nephrotoxic  agents.  Sequelae of MGUS. 4.  Diabetes mellitus type 2: Continue basal insulin adjusted for hospital diet.  Sliding scale insulin as well 5.  Lipidemia: Continue statin therapy 6.  DVT prophylaxis: Heparin 5 7.  GI prophylaxis: None 7. Bradycardia: decreased dose of coreg.  All the records are reviewed and case discussed with Care Management/Social Workerr. Management plans discussed with the patient, family and they are in agreement.  CODE STATUS: Full.  TOTAL TIME TAKING CARE OF THIS PATIENT: 35 minutes.    POSSIBLE D/C IN 1-2 DAYS, DEPENDING ON CLINICAL CONDITION.   Vaughan Basta M.D on 06/28/2017   Between 7am to 6pm - Pager - [phone]  After 6pm go to [email or web address] - password [email or web address] [facility]  Office  [phone]  CC: Primary care physician; Cletis Athens, MD  Note: This dictation was prepared with Dragon dictation along with smaller phrase technology. Any transcriptional errors that result from this process are unintentional.

## 2017-06-28 NOTE — Care Management Obs Status (Signed)
[facility] NOTIFICATION   Patient Details  Name: Jesse Dickson MRN: [id number] Date of Birth: 1940/12/07   Medicare Observation Status Notification Given:  Yes Permission to x given   Shelbie Ammons, RN 06/28/2017, 9:07 AM

## 2017-06-28 NOTE — Progress Notes (Signed)
*  PRELIMINARY RESULTS* Echocardiogram 2D Echocardiogram has been performed.  Sherrie Sport 06/28/2017, 9:41 AM

## 2017-06-28 NOTE — Plan of Care (Signed)
  Problem: Education: Goal: Knowledge of General Education information will improve Outcome: Progressing   Problem: Health Behavior/Discharge Planning: Goal: Ability to manage health-related needs will improve Outcome: Progressing   Problem: Clinical Measurements: Goal: Will remain free from infection Outcome: Progressing   

## 2017-06-28 NOTE — H&P (Signed)
Jesse Dickson is an 76 y.o. male.   Chief Complaint: Chest pain HPI: The patient with past medical history of diabetes, chronic kidney disease, CHF, hypertension, MGUS and prostate cancer status post prostatectomy presents the emergency department complaining of chest pain.  The patient states that he has had chest pain intermittently for weeks.  Tonight his pain has become severe enough to warrant evaluation.  He states his pain usually comes on when he stands up or walks upstairs.  He has associated shortness of breath.  He is also had some lower extreme any swelling 4 weeks.  The patient reports a recent visit to his cardiologist as well as a recent echocardiogram which is not available for review at this time.  He also frequently is unable to afford his medications.  His pain tonight is centrally located in his chest and does not radiate.  The patient denies nausea, vomiting or diaphoresis.  His troponin was found to be elevated in the emergency department.  EKG shows a left bundle branch block which is possible to surmise is not old given the wife's report that he has had an "irregular heartbeat" for some time now.  Due to his ongoing chest pain as well as comorbidities emergency department staff called the hospitalist staff for admission.  Past Medical History:  Diagnosis Date  . CHF (congestive heart failure) ([place])   . CKD (chronic kidney disease)   . Diabetes mellitus without complication ([place])   . DVT (deep venous thrombosis) ([place])   . GERD (gastroesophageal reflux disease)   . Hypercholesteremia   . Hypertension   . MGUS (monoclonal gammopathy of unknown significance)   . Prostate CA ([place]) 2015   only surgery per pt    Past Surgical History:  Procedure Laterality Date  . PROSTATECTOMY      Family History  Problem Relation Age of Onset  . Diabetes Mother   . COPD Mother   . Cancer Sister   . Cancer Sister   . Dementia Sister    Social History:  reports that he has never  smoked. He has never used smokeless tobacco. He reports that he does not drink alcohol or use drugs.  Allergies: No Known Allergies  Prior to Admission medications   Medication Sig Start Date End Date Taking? Authorizing Provider  aspirin EC 81 MG tablet Take 81 mg by mouth daily.    Yes [provider]  atorvastatin (LIPITOR) 20 MG tablet Take 20 mg by mouth daily.   Yes [provider]  calcitRIOL (ROCALTROL) 0.25 MCG capsule Take 0.25 mcg by mouth daily. 11/13/16  Yes [provider]  carvedilol (COREG) 25 MG tablet Take 25 mg by mouth 2 (two) times daily.   Yes [provider]  docusate sodium (COLACE) 100 MG capsule Take 100 mg by mouth 2 (two) times daily.   Yes [provider]  famotidine (PEPCID) 20 MG tablet Take 20 mg by mouth daily.    Yes [provider]  furosemide (LASIX) 40 MG tablet Take 40 mg by mouth 2 (two) times daily.   Yes [provider]  gabapentin (NEURONTIN) 100 MG capsule Take 200 mg by mouth daily. 06/20/16  Yes [provider]  insulin glargine (LANTUS) 100 UNIT/ML injection Inject 25 Units into the skin at bedtime.    Yes [provider]  linaclotide Rolan Lipa) 290 MCG CAPS capsule Take 1 capsule (290 mcg total) by mouth daily before breakfast. 03/14/17 06/28/17 Yes Jonathon Bellows, MD  losartan (COZAAR)  25 MG tablet Take 25 mg by mouth daily.   Yes [provider]  polyethylene glycol (MIRALAX / GLYCOLAX) packet Take 17 g by mouth daily as needed for mild constipation.    Yes [provider]  tamsulosin (FLOMAX) 0.4 MG CAPS capsule Take 0.4 mg by mouth at bedtime.   Yes [provider]  TRESIBA FLEXTOUCH 100 UNIT/ML SOPN FlexTouch Pen INJECT 30UNITS WITH AUTO-INJECTOR ONCE DAILY 02/14/17  Yes [provider]  VENTOLIN HFA 108 (90 Base) MCG/ACT inhaler INHALE 1 PUFF BY MOUTH 3 TIMES A DAY AS NEEDED 02/14/17  Yes [provider]  Insulin Syringe-Needle  U-100 (INSULIN SYRINGE 1CC/30GX5/16") 30G X 5/16" 1 ML MISC Use 1 Dose 4 (four) times daily. 09/10/15   [provider]  Roma Schanz test strip CHECK DAILY 01/21/17   [provider]  traMADol (ULTRAM) 50 MG tablet Take 1 tablet (50 mg total) by mouth every 6 (six) hours as needed. Patient not taking: Reported on 06/28/2017 02/18/15   Johnn Hai, PA-C     Results for orders placed or performed during the hospital encounter of 06/28/17 (from the past 48 hour(s))  Basic metabolic panel     Status: Abnormal   Collection Time: 06/28/17 12:23 AM  Result Value Ref Range   Sodium 135 135 - 145 mmol/L   Potassium 4.7 3.5 - 5.1 mmol/L   Chloride 104 101 - 111 mmol/L   CO2 27 22 - 32 mmol/L   Glucose, Bld 276 (H) 65 - 99 mg/dL   BUN 32 (H) 6 - 20 mg/dL   Creatinine, Ser 1.83 (H) 0.61 - 1.24 mg/dL   Calcium 8.9 8.9 - 10.3 mg/dL   GFR calc non Af Amer 35 (L) >60 mL/min   GFR calc Af Amer 40 (L) >60 mL/min    Comment: (NOTE) The eGFR has been calculated using the CKD EPI equation. This calculation has not been validated in all clinical situations. eGFR's persistently <60 mL/min signify possible Chronic Kidney Disease.    Anion gap 4 (L) 5 - 15    Comment: Performed at [facility], [address] [place], [place] [address]  CBC     Status: Abnormal   Collection Time: 06/28/17 12:23 AM  Result Value Ref Range   WBC 6.0 3.8 - 10.6 K/uL   RBC 3.15 (L) 4.40 - 5.90 MIL/uL   Hemoglobin 9.2 (L) 13.0 - 18.0 g/dL   HCT 28.6 (L) 40.0 - 52.0 %   MCV 90.7 80.0 - 100.0 fL   MCH 29.2 26.0 - 34.0 pg   MCHC 32.2 32.0 - 36.0 g/dL   RDW 13.7 11.5 - 14.5 %   Platelets 237 150 - 440 K/uL    Comment: Performed at [facility], [address] [place],  [address]  Troponin I     Status: Abnormal   Collection Time: 06/28/17 12:23 AM  Result Value Ref Range   Troponin I 0.06 (HH) <0.03 ng/mL    Comment: CRITICAL RESULT CALLED TO, READ BACK BY AND VERIFIED  WITH: RACQUEL DAVIS ON 06/28/17 AT [place] Performed at [facility], [address] [place],  [address] CORRECTED ON 04/24 AT 0201: PREVIOUSLY REPORTED AS 0.06 CRITICAL VALUE NOTED. VALUE IS CONSISTENT WITH PREVIOUSLY REPORTED/CALLED VALUE. JAG   Brain natriuretic peptide     Status: Abnormal   Collection Time: 06/28/17 12:23 AM  Result Value Ref Range   B Natriuretic Peptide 362.0 (H) 0.0 - 100.0 pg/mL  Comment: Performed at [facility], [address] [place], [place] [address]   Dg Chest 2 View  Result Date: 06/28/2017 CLINICAL DATA:  Mid chest pain x2 weeks with dyspnea. EXAM: CHEST - 2 VIEW COMPARISON:  06/26/2017 FINDINGS: Stable mild cardiomegaly. Pulmonary vascular congestion with bibasilar atelectasis and trace bilateral pleural effusions unchanged in appearance. No acute osseous abnormality. IMPRESSION: Stable cardiomegaly with mild vascular congestion, bibasilar atelectasis and trace bilateral pleural effusions. No significant change from prior. Electronically Signed   By: Ashley Royalty M.D.   On: 06/28/2017 01:10   Dg Chest 2 View  Result Date: 06/27/2017 CLINICAL DATA:  Chest pain. EXAM: CHEST - 2 VIEW COMPARISON:  Radiographs of March 20, 2017. FINDINGS: Stable cardiomediastinal silhouette. No pneumothorax is noted. Stable elevated left hemidiaphragm is noted. Stable bibasilar subsegmental atelectasis is noted with minimal pleural effusions. Bony thorax is unremarkable. IMPRESSION: Stable bibasilar subsegmental atelectasis with minimal pleural effusions. Electronically Signed   By: Marijo Conception, M.D.   On: 06/27/2017 08:24    Review of Systems  Constitutional: Negative for chills and fever.  HENT: Negative for sore throat and tinnitus.   Eyes: Negative for blurred vision and redness.  Respiratory: Positive for shortness of breath. Negative for cough.   Cardiovascular: Positive for chest pain and leg swelling. Negative for palpitations,  orthopnea and PND.  Gastrointestinal: Negative for abdominal pain, diarrhea, nausea and vomiting.  Genitourinary: Negative for dysuria, frequency and urgency.  Musculoskeletal: Negative for joint pain and myalgias.  Skin: Negative for rash.       No lesions  Neurological: Negative for speech change, focal weakness and weakness.  Endo/Heme/Allergies: Does not bruise/bleed easily.       No temperature intolerance  Psychiatric/Behavioral: Negative for depression and suicidal ideas.    Blood pressure (!) 123/50, pulse 66, temperature 97.7 F (36.5 C), temperature source Oral, resp. rate 20, height _0  (1.727 m), weight 89.4 kg (197 lb), SpO2 98 %. Physical Exam  Vitals reviewed. Constitutional: He is oriented to person, place, and time. He appears well-developed and well-nourished. No distress.  HENT:  Head: Normocephalic and atraumatic.  Mouth/Throat: Oropharynx is clear and moist.  Eyes: Pupils are equal, round, and reactive to light. Conjunctivae and EOM are normal. No scleral icterus.  Neck: Normal range of motion. Neck supple. No JVD present. No tracheal deviation present. No thyromegaly present.  Cardiovascular: Normal rate, regular rhythm and normal heart sounds. Exam reveals no gallop and no friction rub.  No murmur heard. Respiratory: Effort normal and breath sounds normal. No respiratory distress.  GI: Soft. Bowel sounds are normal. He exhibits no distension. There is no tenderness.  Genitourinary:  Genitourinary Comments: Deferred  Musculoskeletal: Normal range of motion. He exhibits edema.  Lymphadenopathy:    He has no cervical adenopathy.  Neurological: He is alert and oriented to person, place, and time. No cranial nerve deficit.  Skin: Skin is warm and dry. No rash noted. No erythema.  Psychiatric: He has a normal mood and affect. His behavior is normal. Judgment and thought content normal.     Assessment/Plan This is a 77 year old male admitted for chest pain. 1.   Chest pain: Ongoing; elevated troponin.  Continue to monitor telemetry.  Follow cardiac biomarkers.  Consult cardiology.  Nitro paste applied to the patient's chest.  Morphine as needed for severe pain.  Continue aspirin 2.  CHF: Diastolic; acute on chronic.  Has bilateral pleural effusions as well as trace nonpitting edema of the lower extremities.  I have scheduled Lasix for the patient x3 doses.  Daily weights.  Continue carvedilol and losartan 3.  CKD: Stage III; avoid nephrotoxic agents.  Sequelae of MGUS. 4.  Diabetes mellitus type 2: Continue basal insulin adjusted for hospital diet.  Sliding scale insulin as well 5.  Lipidemia: Continue statin therapy 6.  DVT prophylaxis: Heparin 5 7.  GI prophylaxis: None The patient is a full code.  Time spent on admission orders and patient care approximately 45 minutes  Harrie Foreman, MD 06/28/2017, 3:24 AM

## 2017-06-28 NOTE — Plan of Care (Signed)
  Problem: Education: Goal: Knowledge of General Education information will improve Outcome: Progressing   Problem: Health Behavior/Discharge Planning: Goal: Ability to manage health-related needs will improve Outcome: Progressing   Problem: Clinical Measurements: Goal: Ability to maintain clinical measurements within normal limits will improve Outcome: Progressing Goal: Will remain free from infection Outcome: Progressing Goal: Diagnostic test results will improve Outcome: Progressing Goal: Respiratory complications will improve Outcome: Progressing Goal: Cardiovascular complication will be avoided Outcome: Progressing   Problem: Activity: Goal: Risk for activity intolerance will decrease Outcome: Progressing   Problem: Nutrition: Goal: Adequate nutrition will be maintained Outcome: Progressing   Problem: Coping: Goal: Level of anxiety will decrease Outcome: Progressing   Problem: Pain Managment: Goal: General experience of comfort will improve Outcome: Progressing

## 2017-06-28 NOTE — Care Management Note (Signed)
Case Management Note  Patient Details  Name: Danh Bayus MRN: [id number] Date of Birth: 1940/07/13  Subjective/Objective:  Admitted to [facility] under observation status with the diagnosis of chest pain. Lives with wife, Perrin Maltese [phone]). Last seen Dr. Lynnell Chad Monday. Prescriptions are filled at [facility] on [address], States they have a drug plan and can afford their medications. No home Health. No skilled Nursing. No home oxygen. Uses a cane to aide in ambulation. No falls. Fair-good appetite. Son will transport.                  Action/Plan: Would like a rollin walker prior to discharge   Expected Discharge Date:                  Expected Discharge Plan:     In-House Referral:     Discharge planning Services     Post Acute Care Choice:    Choice offered to:    [facility]  Arranged:    DME Agency:     HH Arranged:    [place] Agency:     Status of Service:     If discussed at [employer], dates discussed:    Additional Comments:  Shelbie Ammons, RN MSN CCM Care Management [phone] 06/28/2017, 9:00 AM

## 2017-06-28 NOTE — ED Triage Notes (Signed)
Pt to triage via w/c with no distress; c/o mid CP x 2 wks accomp by [facility] ; denies hx of same

## 2017-06-28 NOTE — Progress Notes (Addendum)
Inpatient Diabetes Program Recommendations  AACE/ADA: New Consensus Statement on Inpatient Glycemic Control (2015)  Target Ranges:  Prepandial:   less than 140 mg/dL      Peak postprandial:   less than 180 mg/dL (1-2 hours)      Critically ill patients:  140 - 180 mg/dL  Results for Jesse Dickson, Jesse Dickson (MRN [id number]) as of 06/28/2017 09:03  Ref. Range 06/28/2017 04:36 06/28/2017 07:26  Glucose-Capillary Latest Ref Range: 65 - 99 mg/dL 207 (H) 215 (H)  Novolog 3 units   Results for [name], [name] (MRN [id number]) as of 06/28/2017 09:03  Ref. Range 06/26/2017 16:21  Hemoglobin A1C Latest Ref Range: 4.8 - 5.6 % 6.9 (H)   Review of Glycemic Control  Diabetes history: DM2 Outpatient Diabetes medications: Tresiba 30 units daily when needed (if glucose is over 150 mg/dl) Current orders for Inpatient glycemic control: Lantus 18 units QHS, Novolog 0-9 units TID with meals  Inpatient Diabetes Program Recommendations:  Insulin - Basal: Please consider discontinuing Lantus at this time and follow glucose trends to determine if basal insulin is needed. Insulin-Correction: Please consider ordering Novolog 0-5 units QHS for bedtime correction. HgbA1C: A1C 6.9% on 06/26/17 indicating an average glucose of 151 mg/dl. Patient states that he has [place] insulin at home and that he only takes it if CBG is over 150 mg/dl. Patient states that he has only taken one time over the past week (last taken on 4/22).  NOTE: Spoke with patient over the phone about diabetes and home regimen for diabetes control. Patient reports that he is followed by PCP for diabetes management and currently he takes [place] 30 units daily if needed (if CBG >150 mg/dl) as an outpatient for diabetes control. Patient reports that he rarely has to take the [place] because his glucose is usually under 150 mg/dl. Patient states that over the past week he has only taken the [place] once (on Monday 4/22). Patient states that is checks his glucose  twice a day at home.  Informed patient of A1C results (6.9% on 06/26/17) and explained that his current A1C indicates an average glucose of 151 mg/dl over the past 2-3 months. Informed patient he is currently ordered Lantus and Novolog insulin but recommendation will be made to discontinue Lantus for now and use Novolog correction scale.  If CBGs are consistently greater than 180 mg/dl with Novolog correction scale, will ask that low dose Lantus be added back. Patient verbalized understanding of information discussed and he states that he has no further questions at this time related to diabetes.  Thanks, Barnie Alderman, RN, MSN, CDE Diabetes Coordinator Inpatient Diabetes Program [phone] (Team Pager)

## 2017-06-28 NOTE — Plan of Care (Signed)
  Problem: Clinical Measurements: Goal: Diagnostic test results will improve Outcome: Progressing Goal: Respiratory complications will improve Outcome: Progressing   Problem: Activity: Goal: Risk for activity intolerance will decrease Outcome: Progressing   

## 2017-06-28 NOTE — Progress Notes (Signed)
Pt arrived via stretcher from ED with significant other at bedside. Pt A&O. Pt brought his own cane. Telemetry monitor applied and called to CCMD. Booklet given. Oriented to room and call bell system, bed and telephone.

## 2017-06-28 NOTE — Consult Note (Signed)
Cardiology Consultation:   Patient ID: Jesse Dickson; [id number]; 1940-05-01   Admit date: 06/28/2017 Date of Consult: 06/28/2017  Primary Care Provider: Cletis Athens, MD Primary Cardiologist: [place]   Patient Profile:   Jesse Dickson is a 77 y.o. male with a hx of chronic diastolic CHF, CKD stage III, anemia of chronic disease, IDDM, HLD, HTN, chronic constipation, prostate cancer s/p prostatectomy, MGUS, GERD, and chronic low back pain who is being seen today for the evaluation of chest pain at the request of Dr. Marcille Blanco, MD.  History of Present Illness:   Jesse Dickson was previously followed in [place], though records are not available at this time. Since moving to [place], [name]. Most recent echo on file from 05/2015 showed an EF of 55%, moderate concentric LVH, moderate LAE, DD, trivial AR/MR, mild TR/PR, normal RVSF.   He has noted intermittent exertional chest and abdominal pain for the past several weeks and has been evaluated by his primary cardiologist with reported echo (not available for review) and CXR that showed stable bibasilar subsegmental atelectasis with minimal pleural effusions. It is unclear what outpatient therapies have been attempted. On the evening of 4/23, his pains persisted longer than they had previously prompting him to come in for evaluation. His weight is up 5 pounds from cancer center visit on 05/30/2017 (192-->197 pounds).   Upon the patient's arrival to [facility] they were found to have stable vitals with oxygen saturation 98% on room air, weight 197 pounds. EKG showed NSR, 67 bpm, LBBB (uncertain chronicity), CXR showed stable cardiomegaly with mild vascular congestion, bibasilar atelectasis with trace bilateral pleural effusions, unchanged from the prior study on 06/26/2017. Labs showed troponin 0.06-->0.05, BNP 362, WBC 6.0, HGB 9.2 (baseline ~ 9), TSH 1.542, SCr 1.83 (baseline ~ 1.9-2.2), K+ 4.7. Upon admission, he was  given IV Lasix 40 mg x 1, nitropaste was applied any cardiology was asked to evaluate. Currently, notes multiple complaints including abdominal swelling, constipation, and lower extremity swelling.  Query of Care Everywhere did not show any prior records from [place], [place].   Past Medical History:  Diagnosis Date  . Chronic diastolic CHF (congestive heart failure) ([place])    a. TTE 05/2015 EF > 55%  . Chronic kidney disease (CKD), stage III (moderate) (H[facility])   . DVT (deep venous thrombosis) ([place])   . GERD (gastroesophageal reflux disease)   . Hypercholesteremia   . Hypertension   . Insulin dependent diabetes mellitus ([place])   . MGUS (monoclonal gammopathy of unknown significance)   . Prostate CA ([place]) 2015   only surgery per pt    Past Surgical History:  Procedure Laterality Date  . PROSTATECTOMY       Home Meds: Prior to Admission medications   Medication Sig Start Date End Date Taking? Authorizing Provider  aspirin EC 81 MG tablet Take 81 mg by mouth daily.    Yes [provider]  atorvastatin (LIPITOR) 20 MG tablet Take 20 mg by mouth daily.   Yes [provider]  calcitRIOL (ROCALTROL) 0.25 MCG capsule Take 0.25 mcg by mouth daily. 11/13/16  Yes [provider]  carvedilol (COREG) 25 MG tablet Take 25 mg by mouth 2 (two) times daily.   Yes [provider]  docusate sodium (COLACE) 100 MG capsule Take 100 mg by mouth 2 (two) times daily.   Yes [provider]  famotidine (PEPCID) 20 MG tablet Take 20 mg by mouth daily.    Yes  [provider]  furosemide (LASIX) 40 MG tablet Take 40 mg by mouth 2 (two) times daily.   Yes [provider]  gabapentin (NEURONTIN) 100 MG capsule Take 200 mg by mouth daily. 06/20/16  Yes [provider]  linaclotide Rolan Lipa) 290 MCG CAPS capsule Take 1 capsule (290 mcg total) by mouth daily before breakfast. 03/14/17 06/28/17 Yes Jonathon Bellows, MD  losartan (COZAAR) 25 MG tablet Take 25 mg by  mouth daily.   Yes [provider]  polyethylene glycol (MIRALAX / GLYCOLAX) packet Take 17 g by mouth daily as needed for mild constipation.    Yes [provider]  tamsulosin (FLOMAX) 0.4 MG CAPS capsule Take 0.4 mg by mouth at bedtime.   Yes [provider]  TRESIBA FLEXTOUCH 100 UNIT/ML SOPN FlexTouch Pen INJECT 30UNITS WITH AUTO-INJECTOR ONCE DAILY if CBG > 150 mg/dl 02/14/17  Yes [provider]  VENTOLIN HFA 108 (90 Base) MCG/ACT inhaler INHALE 1 PUFF BY MOUTH 3 TIMES A DAY AS NEEDED 02/14/17  Yes [provider]  Insulin Syringe-Needle U-100 (INSULIN SYRINGE 1CC/30GX5/16") 30G X 5/16" 1 ML MISC Use 1 Dose 4 (four) times daily. 09/10/15   [provider]  Roma Schanz test strip CHECK DAILY 01/21/17   [provider]  traMADol (ULTRAM) 50 MG tablet Take 1 tablet (50 mg total) by mouth every 6 (six) hours as needed. Patient not taking: Reported on 06/28/2017 02/18/15   Johnn Hai, PA-C    Inpatient Medications: Scheduled Meds: . aspirin EC  81 mg Oral Daily  . atorvastatin  20 mg Oral Daily  . calcitRIOL  0.25 mcg Oral Daily  . carvedilol  25 mg Oral BID  . docusate sodium  100 mg Oral BID  . famotidine  20 mg Oral Daily  . furosemide  40 mg Oral BID  . gabapentin  200 mg Oral Daily  . heparin  5,000 Units Subcutaneous Q8H  . insulin aspart  0-9 Units Subcutaneous TID WC  . insulin glargine  18 Units Subcutaneous QHS  . linaclotide  290 mcg Oral QAC breakfast  . losartan  25 mg Oral Daily  . tamsulosin  0.4 mg Oral QHS   Continuous Infusions:  PRN Meds: acetaminophen **OR** acetaminophen, albuterol, ondansetron **OR** ondansetron (ZOFRAN) IV, polyethylene glycol  Allergies:  No Known Allergies  Social History:   Social History   Socioeconomic History  . Marital status: Married    Spouse name: Not on file  . Number of children: Not on file  . Years of education: Not on file  . Highest education level:  Not on file  Occupational History  . Not on file  Social Needs  . Financial resource strain: Not on file  . Food insecurity:    Worry: Not on file    Inability: Not on file  . Transportation needs:    Medical: Not on file    Non-medical: Not on file  Tobacco Use  . Smoking status: Never Smoker  . Smokeless tobacco: Never Used  Substance and Sexual Activity  . Alcohol use: No    Frequency: Never  . Drug use: No  . Sexual activity: Never  Lifestyle  . Physical activity:    Days per week: Not on file    Minutes per session: Not on file  . Stress: Not on file  Relationships  . Social connections:    Talks on phone: Not on file    Gets together: Not on file  Attends religious service: Not on file    Active member of club or organization: Not on file    Attends meetings of clubs or organizations: Not on file    Relationship status: Not on file  . Intimate partner violence:    Fear of current or ex partner: Not on file    Emotionally abused: Not on file    Physically abused: Not on file    Forced sexual activity: Not on file  Other Topics Concern  . Not on file  Social History Narrative  . Not on file     Family History:   Family History  Problem Relation Age of Onset  . Diabetes Mother   . COPD Mother   . Cancer Sister   . Cancer Sister   . Dementia Sister     ROS:  Review of Systems  Constitutional: Positive for malaise/fatigue. Negative for chills, diaphoresis, fever and weight loss.  HENT: Negative for congestion.   Eyes: Negative for discharge and redness.  Respiratory: Positive for shortness of breath. Negative for cough, hemoptysis, sputum production and wheezing.   Cardiovascular: Positive for chest pain and leg swelling. Negative for palpitations, orthopnea, claudication and PND.  Gastrointestinal: Positive for abdominal pain, constipation, heartburn and nausea. Negative for blood in stool, diarrhea, melena and vomiting.  Genitourinary: Negative for  hematuria.  Musculoskeletal: Negative for falls and myalgias.  Skin: Negative for rash.  Neurological: Positive for weakness. Negative for dizziness, tingling, tremors, sensory change, speech change, focal weakness and loss of consciousness.  Endo/Heme/Allergies: Does not bruise/bleed easily.  Psychiatric/Behavioral: Negative for substance abuse. The patient is not nervous/anxious.   All other systems reviewed and are negative.     Physical Exam/Data:   Vitals:   06/28/17 0330 06/28/17 0422 06/28/17 0725 06/28/17 0942  BP: 116/65 (!) 111/49 (!) 98/49 112/63  Pulse:  67 66 65  Resp: 14 16 16    Temp:  98.2 F (36.8 C) 97.9 F (36.6 C)   TempSrc:  Oral Oral   SpO2:  96% 97%   Weight:  197 lb 11.2 oz (89.7 kg)    Height:  5\' 8"  (1.727 m)      Intake/Output Summary (Last 24 hours) at 06/28/2017 1043 Last data filed at 06/28/2017 1040 Gross per 24 hour  Intake 120 ml  Output 800 ml  Net -680 ml   Filed Weights   06/28/17 0021 06/28/17 0422  Weight: 197 lb (89.4 kg) 197 lb 11.2 oz (89.7 kg)   Body mass index is 30.06 kg/m.   Physical Exam: General: Well developed, well nourished, in no acute distress. Head: Normocephalic, atraumatic, sclera non-icteric, no xanthomas, nares without discharge.  Neck: Negative for carotid bruits. JVD not elevated. Lungs: Clear bilaterally to auscultation without wheezes, rales, or rhonchi. Breathing is unlabored. Heart: RRR with S1 S2. No murmurs, rubs, or gallops appreciated. Abdomen: Firm, non-tender, non-distended with normoactive bowel sounds. No hepatomegaly. No rebound/guarding. No obvious abdominal masses. Msk:  Strength and tone appear normal for age. Extremities: No clubbing or cyanosis. 1+ bilateral lower extremity edema. Distal pedal pulses are 2+ and equal bilaterally. Neuro: Alert and oriented X 3. No facial asymmetry. No focal deficit. Moves all extremities spontaneously. Psych:  Responds to questions appropriately with a normal  affect.   EKG:  The EKG was personally reviewed and demonstrates: NSR, 67 bpm, LBBB (unknwon chronicity)  Telemetry:  Telemetry was personally reviewed and demonstrates: NSR, LBBB  Weights: Filed Weights   06/28/17 0021 06/28/17 0422  Weight:  197 lb (89.4 kg) 197 lb 11.2 oz (89.7 kg)    Relevant CV Studies: TTE pending.   Laboratory Data:  Chemistry Recent Labs  Lab 06/26/17 1621 06/28/17 0023  NA 138 135  K 4.7 4.7  CL 102 104  CO2 28 27  GLUCOSE 230* 276*  BUN 35* 32*  CREATININE 2.05* 1.83*  CALCIUM 8.8* 8.9  GFRNONAA 30* 35*  GFRAA 35* 40*  ANIONGAP 8 4*    No results for input(s): PROT, ALBUMIN, AST, ALT, ALKPHOS, BILITOT in the last 168 hours. Hematology Recent Labs  Lab 06/26/17 1621 06/28/17 0023  WBC 7.2 6.0  RBC 3.02* 3.15*  HGB 9.1* 9.2*  HCT 27.4* 28.6*  MCV 90.8 90.7  MCH 30.0 29.2  MCHC 33.1 32.2  RDW 13.7 13.7  PLT 229 237   Cardiac Enzymes Recent Labs  Lab 06/28/17 0023 06/28/17 0605  TROPONINI 0.06* 0.05*   No results for input(s): TROPIPOC in the last 168 hours.  BNP Recent Labs  Lab 06/26/17 1621 06/28/17 0023  BNP 332.0* 362.0*    DDimer No results for input(s): DDIMER in the last 168 hours.  Radiology/Studies:  Dg Chest 2 View  Result Date: 06/28/2017 IMPRESSION: Stable cardiomegaly with mild vascular congestion, bibasilar atelectasis and trace bilateral pleural effusions. No significant change from prior. Electronically Signed   By: Ashley Royalty M.D.   On: 06/28/2017 01:10   Dg Chest 2 View  Result Date: 06/27/2017 IMPRESSION: Stable bibasilar subsegmental atelectasis with minimal pleural effusions. Electronically Signed   By: Marijo [place], M.D.   On: 06/27/2017 08:24    Assessment and Plan:   1. Chest pain with moderate risk for cardiac etiology/elevated troponin: -Chest pain free -Troponin minimally elevated with a peak of 0.06, likley supply demand ischemia in the setting of volume overload, CKD, and  anemia -Check echo -Further recommendations pending echo -ASA  2. Acute on chronic diastolic CHF: -Echo pending -Gentle diuresis as ordered by IM -Coreg, losartan as BP allows  3. CKD stage III: -Monitor   4. Constipation: -Per IM  5. Anemia of chronic disease: -Stable  6. HLD: -Statin -Check FLP  7. IDDM: -Per IM -Recommend A1c   For questions or updates, please contact [place] Please consult [email or web address] for contact info under Cardiology/STEMI.   Signed, Christell Faith, PA-C [place] Pager: [phone] 06/28/2017, 10:43 AM

## 2017-06-29 ENCOUNTER — Other Ambulatory Visit: Payer: Self-pay | Admitting: Physician Assistant

## 2017-06-29 ENCOUNTER — Observation Stay (HOSPITAL_BASED_OUTPATIENT_CLINIC_OR_DEPARTMENT_OTHER): Payer: Medicare HMO

## 2017-06-29 ENCOUNTER — Encounter: Payer: Self-pay | Admitting: Radiology

## 2017-06-29 ENCOUNTER — Observation Stay: Payer: Medicare HMO

## 2017-06-29 DIAGNOSIS — K5900 Constipation, unspecified: Secondary | ICD-10-CM | POA: Diagnosis not present

## 2017-06-29 DIAGNOSIS — I10 Essential (primary) hypertension: Secondary | ICD-10-CM | POA: Diagnosis not present

## 2017-06-29 DIAGNOSIS — K5904 Chronic idiopathic constipation: Secondary | ICD-10-CM

## 2017-06-29 DIAGNOSIS — I5032 Chronic diastolic (congestive) heart failure: Secondary | ICD-10-CM | POA: Diagnosis not present

## 2017-06-29 DIAGNOSIS — E119 Type 2 diabetes mellitus without complications: Secondary | ICD-10-CM | POA: Diagnosis not present

## 2017-06-29 DIAGNOSIS — I5033 Acute on chronic diastolic (congestive) heart failure: Secondary | ICD-10-CM | POA: Diagnosis not present

## 2017-06-29 DIAGNOSIS — R001 Bradycardia, unspecified: Secondary | ICD-10-CM | POA: Diagnosis not present

## 2017-06-29 DIAGNOSIS — R55 Syncope and collapse: Secondary | ICD-10-CM

## 2017-06-29 DIAGNOSIS — N183 Chronic kidney disease, stage 3 (moderate): Secondary | ICD-10-CM | POA: Diagnosis not present

## 2017-06-29 DIAGNOSIS — R079 Chest pain, unspecified: Secondary | ICD-10-CM

## 2017-06-29 DIAGNOSIS — E118 Type 2 diabetes mellitus with unspecified complications: Secondary | ICD-10-CM | POA: Diagnosis not present

## 2017-06-29 DIAGNOSIS — E782 Mixed hyperlipidemia: Secondary | ICD-10-CM | POA: Diagnosis not present

## 2017-06-29 DIAGNOSIS — N184 Chronic kidney disease, stage 4 (severe): Secondary | ICD-10-CM | POA: Diagnosis not present

## 2017-06-29 LAB — NM MYOCAR MULTI W/SPECT W/WALL MOTION / EF
CHL CUP NUCLEAR SRS: 9
CHL CUP NUCLEAR SSS: 5
CHL CUP RESTING HR STRESS: 65 {beats}/min
LV dias vol: 94 mL (ref 62–150)
LVSYSVOL: 49 mL
NUC STRESS TID: 0.88
Peak HR: 78 {beats}/min
Percent HR: 53 %
SDS: 0

## 2017-06-29 LAB — GLUCOSE, CAPILLARY
Glucose-Capillary: 158 mg/dL — ABNORMAL HIGH (ref 65–99)
Glucose-Capillary: 138 mg/dL — ABNORMAL HIGH (ref 65–99)
Glucose-Capillary: 109 mg/dL — ABNORMAL HIGH (ref 65–99)
Glucose-Capillary: 168 mg/dL — ABNORMAL HIGH (ref 65–99)
Glucose-Capillary: 185 mg/dL — ABNORMAL HIGH (ref 65–99)

## 2017-06-29 MED ORDER — REGADENOSON 0.4 MG/5ML IV SOLN
0.4000 mg | Freq: Once | INTRAVENOUS | Status: AC
  Administered 2017-06-29: 0.4 mg via INTRAVENOUS

## 2017-06-29 MED ORDER — CARVEDILOL 6.25 MG PO TABS
6.2500 mg | ORAL_TABLET | Freq: Two times a day (BID) | ORAL | Status: DC
  Administered 2017-06-29: 6.25 mg via ORAL
  Filled 2017-06-29: qty 1

## 2017-06-29 MED ORDER — TECHNETIUM TC 99M TETROFOSMIN IV KIT
30.4290 | PACK | Freq: Once | INTRAVENOUS | Status: AC | PRN
  Administered 2017-06-29: 30.429 via INTRAVENOUS

## 2017-06-29 MED ORDER — TECHNETIUM TC 99M TETROFOSMIN IV KIT
13.0000 | PACK | Freq: Once | INTRAVENOUS | Status: AC | PRN
  Administered 2017-06-29: 12.79 via INTRAVENOUS

## 2017-06-29 NOTE — ED Provider Notes (Signed)
[facility] Department of Emergency Medicine   Code Blue CONSULT NOTE  Chief Complaint: Cardiac arrest/unresponsive   Level V Caveat: Unresponsive  History of present illness: I was contacted by the hospital for a CODE BLUE cardiac arrest upstairs and presented to the patient's bedside.   Reportedly the patient was admitted yesterday for chest pain and has chronic dizziness.  He got up to go to the bathroom and passed out.  The staff got the patient back up in the bed and actually gave him 6 chest compressions before he pushed them away and pushed away the bag-valve-mask.  See hospital course.   ROS: Unable to obtain, Level V caveat  Scheduled Meds: . aspirin EC  81 mg Oral Daily  . atorvastatin  20 mg Oral Daily  . calcitRIOL  0.25 mcg Oral Daily  . carvedilol  12.5 mg Oral BID  . docusate sodium  100 mg Oral BID  . famotidine  20 mg Oral Daily  . furosemide  40 mg Oral BID  . gabapentin  200 mg Oral Daily  . heparin  5,000 Units Subcutaneous Q8H  . insulin aspart  0-9 Units Subcutaneous TID WC  . insulin glargine  5 Units Subcutaneous QHS  . linaclotide  290 mcg Oral QAC breakfast  . losartan  25 mg Oral Daily  . tamsulosin  0.4 mg Oral QHS   Continuous Infusions: PRN Meds:.acetaminophen **OR** acetaminophen, albuterol, ondansetron **OR** ondansetron (ZOFRAN) IV, polyethylene glycol Past Medical History:  Diagnosis Date  . Chronic diastolic CHF (congestive heart failure) ([place])    a. TTE 05/2015 EF > 55%  . Chronic kidney disease (CKD), stage III (moderate) (H[facility])   . DVT (deep venous thrombosis) ([place])   . GERD (gastroesophageal reflux disease)   . Hypercholesteremia   . Hypertension   . Insulin dependent diabetes mellitus ([place])   . MGUS (monoclonal gammopathy of unknown significance)   . Prostate CA ([place]) 2015   only surgery per pt   Past Surgical History:  Procedure Laterality Date  . PROSTATECTOMY     Social History   Socioeconomic History   . Marital status: Married    Spouse name: Not on file  . Number of children: Not on file  . Years of education: Not on file  . Highest education level: Not on file  Occupational History  . Not on file  Social Needs  . Financial resource strain: Not on file  . Food insecurity:    Worry: Not on file    Inability: Not on file  . Transportation needs:    Medical: Not on file    Non-medical: Not on file  Tobacco Use  . Smoking status: Never Smoker  . Smokeless tobacco: Never Used  Substance and Sexual Activity  . Alcohol use: No    Frequency: Never  . Drug use: No  . Sexual activity: Never  Lifestyle  . Physical activity:    Days per week: Not on file    Minutes per session: Not on file  . Stress: Not on file  Relationships  . Social connections:    Talks on phone: Not on file    Gets together: Not on file    Attends religious service: Not on file    Active member of club or organization: Not on file    Attends meetings of clubs or organizations: Not on file    Relationship status: Not on file  . Intimate partner violence:    Fear of current  or ex partner: Not on file    Emotionally abused: Not on file    Physically abused: Not on file    Forced sexual activity: Not on file  Other Topics Concern  . Not on file  Social History Narrative  . Not on file   No Known Allergies  Last set of Vital Signs (not current) Vitals:   06/28/17 1949 06/29/17 0302  BP: 113/61 126/70  Pulse: 65 74  Resp: 18   Temp: 98.1 F (36.7 C)   SpO2: 98% 97%      Physical Exam Vital signs stable, BP about 120/70, HR 80s, spO2 98% Gen: Awake, somewhat altered but appropriately answering questions Cardiovascular: Good peripheral pulse, normal heart sounds Resp: Lungs CTAB, protecting airway Abd: nondistended, mild diffuse tenderness throughout Neuro: No focal neuro deficits HEENT: No acute abnormalities Neck: No crepitus  Musculoskeletal: No deformity  Skin:  warm    MDM/Assessment and Plan   Patient appears to have had a syncopal episode, possibly vasovagal, while getting up and going to the bathroom.  He is awake and alert, protecting airway, no indication for acute or emergent intervention.  Vital signs stable.  Hospitalist is present in the room.  No further role for ED personnel.    Hinda Kehr, MD 06/29/17 [phone]

## 2017-06-29 NOTE — Consult Note (Signed)
Cardiology Consultation:   Patient ID: Parish Dubose; [id number]; 27-May-1940   Admit date: 06/28/2017 Date of Consult: 06/29/2017  Primary Care Provider: Cletis Athens, MD Primary Cardiologist: New to [facility] - consult by [facility] Primary Electrophysiologist:  New to [facility] - consult by Caryl Comes   Patient Profile:   Mareo Portilla is a 77 y.o. male with a hx of chronic diastolic CHF, CKD stage III, anemia of chronic disease, IDDM, HLD, HTN, chronic constipation, prostate cancer s/p prostatectomy, MGUS, GERD, and chronic low back pain who is being seen today for the evaluation of unresponsive episode/possible syncope at the request of Dr. Rockey Situ.  History of Present Illness:   Mr. Reale was previously followed in [place], though records are not available at this time. Since moving to [place], [name]. Most recent echo on file from 05/2015 showed an EF of 55%, moderate concentric LVH, moderate LAE, DD, trivial AR/MR, mild TR/PR, normal RVSF. He was admitted to [facility] on 4/24 with intermittent exertional chest and abdominal pain for the past several weeks. Troponin was minimally elevated and flat trending with a peak of 0.06. BNP of 362. Echo on 4/24 showed an EF of 60-65%, no RWMA, normal LV diastolic function, mild to moderate MR, mildly dilated LA, RVSF normal, PASP 36 mmHg. Records have been requested from [place], though have not arrived to date.   He had an unresponsive/possible syncopal episode overnight around 3 AM. Code blue was called, patient received 6 chest compressions followed by the patient pushing staff away. Telemetry reviewed at that time showed stable P wave intervals with increasing PR intervals with a bradycardic rate in the 40s bpm. He has been asymptomatic since with heart rates in the 80s bpm. Patient does have chronic constipation, on Linzess, though he was not straining to have a BM at the time of the above event. Per RN report, he had stood  up quickly to go to the restroom and became unresponsive. He received 6 chest compressions as above before pushing staff away. His BP has been on the soft side this morning in the low 518A systolic leading to the holding of Lasix and decreasing of Coreg. He is scheduled for a Lexiscan Myoview this morning.   Past Medical History:  Diagnosis Date  . Chronic diastolic CHF (congestive heart failure) ([place])    a. TTE 05/2015 EF > 55%  . Chronic kidney disease (CKD), stage III (moderate) (H[facility])   . DVT (deep venous thrombosis) ([place])   . GERD (gastroesophageal reflux disease)   . Hypercholesteremia   . Hypertension   . Insulin dependent diabetes mellitus ([place])   . MGUS (monoclonal gammopathy of unknown significance)   . Prostate CA ([place]) 2015   only surgery per pt    Past Surgical History:  Procedure Laterality Date  . PROSTATECTOMY       Home Meds: Prior to Admission medications   Medication Sig Start Date End Date Taking? Authorizing Provider  aspirin EC 81 MG tablet Take 81 mg by mouth daily.    Yes [provider]  atorvastatin (LIPITOR) 20 MG tablet Take 20 mg by mouth daily.   Yes [provider]  calcitRIOL (ROCALTROL) 0.25 MCG capsule Take 0.25 mcg by mouth daily. 11/13/16  Yes [provider]  carvedilol (COREG) 25 MG tablet Take 25 mg by mouth 2 (two) times daily.   Yes [provider]  docusate sodium (COLACE) 100 MG capsule Take 100 mg by mouth  2 (two) times daily.   Yes [provider]  famotidine (PEPCID) 20 MG tablet Take 20 mg by mouth daily.    Yes [provider]  furosemide (LASIX) 40 MG tablet Take 40 mg by mouth 2 (two) times daily.   Yes [provider]  gabapentin (NEURONTIN) 100 MG capsule Take 200 mg by mouth daily. 06/20/16  Yes [provider]  linaclotide Rolan Lipa) 290 MCG CAPS capsule Take 1 capsule (290 mcg total) by mouth daily before breakfast. 03/14/17 06/28/17 Yes Jonathon Bellows, MD  losartan  (COZAAR) 25 MG tablet Take 25 mg by mouth daily.   Yes [provider]  polyethylene glycol (MIRALAX / GLYCOLAX) packet Take 17 g by mouth daily as needed for mild constipation.    Yes [provider]  tamsulosin (FLOMAX) 0.4 MG CAPS capsule Take 0.4 mg by mouth at bedtime.   Yes [provider]  TRESIBA FLEXTOUCH 100 UNIT/ML SOPN FlexTouch Pen INJECT 30UNITS WITH AUTO-INJECTOR ONCE DAILY if CBG > 150 mg/dl 02/14/17  Yes [provider]  VENTOLIN HFA 108 (90 Base) MCG/ACT inhaler INHALE 1 PUFF BY MOUTH 3 TIMES A DAY AS NEEDED 02/14/17  Yes [provider]  Insulin Syringe-Needle U-100 (INSULIN SYRINGE 1CC/30GX5/16") 30G X 5/16" 1 ML MISC Use 1 Dose 4 (four) times daily. 09/10/15   [provider]  Roma Schanz test strip CHECK DAILY 01/21/17   [provider]  traMADol (ULTRAM) 50 MG tablet Take 1 tablet (50 mg total) by mouth every 6 (six) hours as needed. Patient not taking: Reported on 06/28/2017 02/18/15   Johnn Hai, PA-C    Inpatient Medications: Scheduled Meds: . aspirin EC  81 mg Oral Daily  . atorvastatin  20 mg Oral Daily  . calcitRIOL  0.25 mcg Oral Daily  . carvedilol  6.25 mg Oral BID  . docusate sodium  100 mg Oral BID  . famotidine  20 mg Oral Daily  . gabapentin  200 mg Oral Daily  . heparin  5,000 Units Subcutaneous Q8H  . insulin aspart  0-9 Units Subcutaneous TID WC  . insulin glargine  5 Units Subcutaneous QHS  . linaclotide  290 mcg Oral QAC breakfast  . losartan  25 mg Oral Daily  . tamsulosin  0.4 mg Oral QHS   Continuous Infusions:  PRN Meds: acetaminophen **OR** acetaminophen, albuterol, ondansetron **OR** ondansetron (ZOFRAN) IV, polyethylene glycol  Allergies:  No Known Allergies  Social History:   Social History   Socioeconomic History  . Marital status: Married    Spouse name: Not on file  . Number of children: Not on file  . Years of education: Not on file  . Highest education  level: Not on file  Occupational History  . Not on file  Social Needs  . Financial resource strain: Not on file  . Food insecurity:    Worry: Not on file    Inability: Not on file  . Transportation needs:    Medical: Not on file    Non-medical: Not on file  Tobacco Use  . Smoking status: Never Smoker  . Smokeless tobacco: Never Used  Substance and Sexual Activity  . Alcohol use: No    Frequency: Never  . Drug use: No  . Sexual activity: Never  Lifestyle  . Physical activity:    Days per week: Not on file    Minutes per session: Not on file  . Stress: Not on file  Relationships  . Social connections:  Talks on phone: Not on file    Gets together: Not on file    Attends religious service: Not on file    Active member of club or organization: Not on file    Attends meetings of clubs or organizations: Not on file    Relationship status: Not on file  . Intimate partner violence:    Fear of current or ex partner: Not on file    Emotionally abused: Not on file    Physically abused: Not on file    Forced sexual activity: Not on file  Other Topics Concern  . Not on file  Social History Narrative  . Not on file     Family History:   Family History  Problem Relation Age of Onset  . Diabetes Mother   . COPD Mother   . Cancer Sister   . Cancer Sister   . Dementia Sister     ROS:  Review of Systems  Constitutional: Positive for malaise/fatigue. Negative for chills, diaphoresis, fever and weight loss.  HENT: Negative for congestion.   Eyes: Negative for discharge and redness.  Respiratory: Positive for shortness of breath. Negative for cough, hemoptysis, sputum production and wheezing.   Cardiovascular: Positive for chest pain. Negative for palpitations, orthopnea, claudication, leg swelling and PND.  Gastrointestinal: Positive for constipation, heartburn and nausea. Negative for abdominal pain, blood in stool, diarrhea, melena and vomiting.  Genitourinary: Negative  for hematuria.  Musculoskeletal: Negative for falls and myalgias.  Skin: Negative for rash.  Neurological: Positive for weakness. Negative for dizziness, tingling, tremors, sensory change, speech change, focal weakness and loss of consciousness.  Endo/Heme/Allergies: Does not bruise/bleed easily.  Psychiatric/Behavioral: Negative for substance abuse. The patient is not nervous/anxious.   All other systems reviewed and are negative.     Physical Exam/Data:   Vitals:   06/29/17 0302 06/29/17 0311 06/29/17 0403 06/29/17 0738  BP: 126/70 (!) 148/76 (!) 102/45 (!) 101/52  Pulse: 74 69 67 69  Resp:   17 17  Temp:   98.1 F (36.7 C) 98.3 F (36.8 C)  TempSrc:   Oral Oral  SpO2: 97% 100% 98% 96%  Weight:   194 lb 14.4 oz (88.4 kg)   Height:        Intake/Output Summary (Last 24 hours) at 06/29/2017 0958 Last data filed at 06/29/2017 0700 Gross per 24 hour  Intake 120 ml  Output 1500 ml  Net -1380 ml   Filed Weights   06/28/17 0021 06/28/17 0422 06/29/17 0403  Weight: 197 lb (89.4 kg) 197 lb 11.2 oz (89.7 kg) 194 lb 14.4 oz (88.4 kg)   Body mass index is 29.63 kg/m.   Physical Exam: General: Well developed, well nourished, in no acute distress. Head: Normocephalic, atraumatic, sclera non-icteric, no xanthomas, nares without discharge.  Neck: Negative for carotid bruits. JVD not elevated. Lungs: Clear bilaterally to auscultation without wheezes, rales, or rhonchi. Breathing is unlabored. Heart: RRR with S1 S2. II/VI systolic murmur at the apex, no rubs, or gallops appreciated. Abdomen: Soft, non-tender, non-distended with normoactive bowel sounds. No hepatomegaly. No rebound/guarding. No obvious abdominal masses. Msk:  Strength and tone appear normal for age. Extremities: No clubbing or cyanosis. No edema. Distal pedal pulses are 2+ and equal bilaterally. Neuro: Alert and oriented X 3. No facial asymmetry. No focal deficit. Moves all extremities spontaneously. Psych:  Flat  affect.   EKG:  The EKG was personally reviewed and demonstrates: NSR, 67 bpm, LBBB  Telemetry:  Telemetry was personally reviewed  and demonstrates: Intermittent LBBB, first degree AV block, occasional junctional escape beats, sinus bradycardia in the 30s to 50s ranging to the 80s bpm in sinus rhythm   Weights: Filed Weights   06/28/17 0021 06/28/17 0422 06/29/17 0403  Weight: 197 lb (89.4 kg) 197 lb 11.2 oz (89.7 kg) 194 lb 14.4 oz (88.4 kg)    Relevant CV Studies: TTE 06/28/2017: Study Conclusions  - Left ventricle: The cavity size was normal. Systolic function was normal. The estimated ejection fraction was in the range of 60% to 65%. Wall motion was normal; there were no regional wall motion abnormalities. Left ventricular diastolic function parameters were normal. - Mitral valve: There was mild to moderate regurgitation. - Left atrium: The atrium was mildly dilated. - Right ventricle: Systolic function was normal. - Pulmonary arteries: Systolic pressure was mildly elevated. PA peak pressure: 36 mm Hg (S).   Myoview pending.   Laboratory Data:  Chemistry Recent Labs  Lab 06/26/17 1621 06/28/17 0023  NA 138 135  K 4.7 4.7  CL 102 104  CO2 28 27  GLUCOSE 230* 276*  BUN 35* 32*  CREATININE 2.05* 1.83*  CALCIUM 8.8* 8.9  GFRNONAA 30* 35*  GFRAA 35* 40*  ANIONGAP 8 4*    No results for input(s): PROT, ALBUMIN, AST, ALT, ALKPHOS, BILITOT in the last 168 hours. Hematology Recent Labs  Lab 06/26/17 1621 06/28/17 0023  WBC 7.2 6.0  RBC 3.02* 3.15*  HGB 9.1* 9.2*  HCT 27.4* 28.6*  MCV 90.8 90.7  MCH 30.0 29.2  MCHC 33.1 32.2  RDW 13.7 13.7  PLT 229 237   Cardiac Enzymes Recent Labs  Lab 06/28/17 0023 06/28/17 0605 06/28/17 1021 06/28/17 1707  TROPONINI 0.06* 0.05* 0.05* 0.06*   No results for input(s): TROPIPOC in the last 168 hours.  BNP Recent Labs  Lab 06/26/17 1621 06/28/17 0023  BNP 332.0* 362.0*    DDimer No results for  input(s): DDIMER in the last 168 hours.  Radiology/Studies:  Dg Chest 2 View  Result Date: 06/28/2017 IMPRESSION: Stable cardiomegaly with mild vascular congestion, bibasilar atelectasis and trace bilateral pleural effusions. No significant change from prior. Electronically Signed   By: Ashley Royalty M.D.   On: 06/28/2017 01:10   Dg Chest 2 View  Result Date: 06/27/2017 IMPRESSION: Stable bibasilar subsegmental atelectasis with minimal pleural effusions. Electronically Signed   By: Marijo Conception, M.D.   On: 06/27/2017 08:24    Assessment and Plan:   1. Unresponsive episode/possiblesyncope: -Telemetry reviewed in detail with Dr. Caryl Comes -Felt to be vasovagal event as he apparently stood up quickly to go to the restroom -BP has been on the soft side -Lasix held this morning -Coreg has been decreased  -Check orthostatic vital signs -Would likely benefit from outpatient sleep study -Continue to monitor -No indication for urgent PPM at this time -TSH normal -Check bmet and magnesium  -May benefit from outpatient cardiac monitoring    For questions or updates, please contact [facility] Please consult [email or web address] for contact info under Cardiology/STEMI.   Signed, Christell Faith, PA-C [place] Pager: [phone] 06/29/2017, 9:58 AM

## 2017-06-29 NOTE — Plan of Care (Addendum)
Rounded with MD. No complaints of pain throughout this shift, Stress test was normal per Cardiology, Pt schedule to have a MRI of the brain today. Low bed ordered for patient.  Problem: Clinical Measurements: Goal: Will remain free from infection Outcome: Progressing Goal: Diagnostic test results will improve Outcome: Progressing   Problem: Safety: Goal: Ability to remain free from injury will improve Outcome: Progressing

## 2017-06-29 NOTE — Progress Notes (Signed)
Chaplain responded to a code blue. No family. Pt was alert and responsive. Chaplain  Prayed for care team and team disbanded.    06/29/17 0300  Clinical Encounter Type  Visited With Patient  Visit Type Initial;Code  Referral From Care management  Spiritual Encounters  Spiritual Needs Prayer

## 2017-06-29 NOTE — Progress Notes (Signed)
PT Cancellation Note  Patient Details Name: Jesse Dickson MRN: [id number] DOB: [birth year]   Cancelled Treatment:    Reason Eval/Treat Not Completed: Medical issues which prohibited therapy.  Per Cariologist note pt with Chest pain with moderate risk for cardiac etiology/elevated troponin.  Pt is pending myoview this morning for evaluation of high-risk ischemia.  Will hold PT until pt more medically appropriate for exertional activity.   Collie Siad PT, DPT 06/29/2017, 8:45 AM

## 2017-06-29 NOTE — Progress Notes (Signed)
Pt off the floor for myoview

## 2017-06-29 NOTE — Progress Notes (Addendum)
Progress Note  Patient Name: Jesse Dickson Date of Encounter: 06/29/2017  Primary Cardiologist: New to [facility] - consult by Rockey Situ  Subjective   Had an unresponsive/syncopal episode overnight around 3 AM. Code blue was called, patient received 6 chest compressions followed by the patient pushing staff away. Echo on 4/24 with preserved EF as detailed below. No chest pain.   Inpatient Medications    Scheduled Meds: . aspirin EC  81 mg Oral Daily  . atorvastatin  20 mg Oral Daily  . calcitRIOL  0.25 mcg Oral Daily  . carvedilol  12.5 mg Oral BID  . docusate sodium  100 mg Oral BID  . famotidine  20 mg Oral Daily  . furosemide  40 mg Oral BID  . gabapentin  200 mg Oral Daily  . heparin  5,000 Units Subcutaneous Q8H  . insulin aspart  0-9 Units Subcutaneous TID WC  . insulin glargine  5 Units Subcutaneous QHS  . linaclotide  290 mcg Oral QAC breakfast  . losartan  25 mg Oral Daily  . tamsulosin  0.4 mg Oral QHS   Continuous Infusions:  PRN Meds: acetaminophen **OR** acetaminophen, albuterol, ondansetron **OR** ondansetron (ZOFRAN) IV, polyethylene glycol   Vital Signs    Vitals:   06/29/17 0302 06/29/17 0311 06/29/17 0403 06/29/17 0738  BP: 126/70 (!) 148/76 (!) 102/45 (!) 101/52  Pulse: 74 69 67 69  Resp:   17 17  Temp:   98.1 F (36.7 C) 98.3 F (36.8 C)  TempSrc:   Oral   SpO2: 97% 100% 98% 96%  Weight:   194 lb 14.4 oz (88.4 kg)   Height:        Intake/Output Summary (Last 24 hours) at 06/29/2017 0812 Last data filed at 06/29/2017 0338 Gross per 24 hour  Intake 120 ml  Output 1500 ml  Net -1380 ml   Filed Weights   06/28/17 0021 06/28/17 0422 06/29/17 0403  Weight: 197 lb (89.4 kg) 197 lb 11.2 oz (89.7 kg) 194 lb 14.4 oz (88.4 kg)    Telemetry    Intermittent LBBB, first degree AV block, occasional junctional escape beats, sinus bradycardia in the 30s to 50s ranging to the 80s bpm in sinus rhythm - Personally Reviewed  ECG    n/a - Personally  Reviewed  Physical Exam   GEN: No acute distress.   Neck: No JVD. Cardiac: RRR, no murmurs, rubs, or gallops.  Respiratory: Clear to auscultation bilaterally.  GI: Soft, nontender, non-distended.   MS: No edema; No deformity. Neuro:  Alert and oriented x 3; Nonfocal.  Psych: Flat affect.  Labs    Chemistry Recent Labs  Lab 06/26/17 1621 06/28/17 0023  NA 138 135  K 4.7 4.7  CL 102 104  CO2 28 27  GLUCOSE 230* 276*  BUN 35* 32*  CREATININE 2.05* 1.83*  CALCIUM 8.8* 8.9  GFRNONAA 30* 35*  GFRAA 35* 40*  ANIONGAP 8 4*     Hematology Recent Labs  Lab 06/26/17 1621 06/28/17 0023  WBC 7.2 6.0  RBC 3.02* 3.15*  HGB 9.1* 9.2*  HCT 27.4* 28.6*  MCV 90.8 90.7  MCH 30.0 29.2  MCHC 33.1 32.2  RDW 13.7 13.7  PLT 229 237    Cardiac Enzymes Recent Labs  Lab 06/28/17 0023 06/28/17 0605 06/28/17 1021 06/28/17 1707  TROPONINI 0.06* 0.05* 0.05* 0.06*   No results for input(s): TROPIPOC in the last 168 hours.   BNP Recent Labs  Lab 06/26/17 1621 06/28/17 0023  BNP 332.0* 362.0*     DDimer No results for input(s): DDIMER in the last 168 hours.   Radiology    Dg Chest 2 View  Result Date: 06/28/2017 IMPRESSION: Stable cardiomegaly with mild vascular congestion, bibasilar atelectasis and trace bilateral pleural effusions. No significant change from prior. Electronically Signed   By: Ashley Royalty M.D.   On: 06/28/2017 01:10    Cardiac Studies   TTE 06/28/2017: Study Conclusions  - Left ventricle: The cavity size was normal. Systolic function was   normal. The estimated ejection fraction was in the range of 60%   to 65%. Wall motion was normal; there were no regional wall   motion abnormalities. Left ventricular diastolic function   parameters were normal. - Mitral valve: There was mild to moderate regurgitation. - Left atrium: The atrium was mildly dilated. - Right ventricle: Systolic function was normal. - Pulmonary arteries: Systolic pressure was  mildly elevated. PA   peak pressure: 36 mm Hg (S).  Patient Profile     77 y.o. male with history of chronic diastolic CHF, CKD stage III, anemia of chronic disease, IDDM, HLD, HTN, chronic constipation, prostate cancer s/p prostatectomy, MGUS, GERD, and chronic low back pain who is being seen today for the evaluation of chest pain.  Assessment & Plan    1. Chest pain with moderate risk for cardiac etiology/elevated troponin: -Chest pain free -Troponin minimally elevated with a peak of 0.06, likley supply demand ischemia in the setting of volume overload, CKD, and anemia -Echo as above -Schedule Lexiscan Myoview this morning to evaluate for high-risk ischemia. Risks and benefits discussed with the patient. Given his syncopal episode we will proceed with Lexiscan -ASA  2. Unresponsive episode/syncope: -Telemetry as above -Uncertain if this was an orthostatic event vs high grade heart block vs sinus node dysfunction -EP to see today -Check orthostatics   3. Acute on chronic diastolic CHF: -He appears euvolemic  -Echo as above -Hold diuresis given soft BP -Decrease Coreg to 6.25 mg bid given soft BP -Continue losartan 25 mg daily  4. CKD stage III: -Monitor   5. Constipation: -Per IM  6. Anemia of chronic disease: -Stable  7. HLD: -Statin -Check FLP  8. IDDM: -Per IM -Recommend A1c   For questions or updates, please contact [place] Please consult [email or web address] for contact info under Cardiology/STEMI.    Signed, Christell Faith, PA-C [place] Pager: [phone] 06/29/2017, 8:12 AM  Attending Note Patient seen and examined, agree with detailed note above,  Patient presentation and plan discussed on rounds.   No complaints, minimally conversant Long discussion with nursing, got up quickly 3 AM to go urinate Appear to have near syncope episode in the doorway of the bathroom At that time had bradycardia rate into the 30s 40 Helped back to bed  was given 5 or 6 chest compressions and seems more alert CODE BLUE was called but no other intervention given  This morning is back to baseline Review of telemetry shows periodic short episodes of bradycardia No other symptoms noted, possibly sleeping with previous episodes,  On physical exam lungs clear to auscultation no JVD, heart sounds regular S1-S2 no murmurs appreciated abdomen soft nontender lower extremity edema has resolved  No labs on today's visit  A/P: 1) chronic diastolic CHF Mildly elevated right heart pressures.  Mild lower extremity edema on arrival  CHF education Follow-up in CHF clinic Discussed daily weights, minimizing fluid and salt intake Lasix 20 daily (hold today, pressure low)  2) chest pain Normal ejection fraction greater than 55%  stress Myoview today Risk factors for ischemia include poorly controlled diabetes, hyperlipidemia  3) anemia Anemia is likely contributing to underlying shortness of breath Likely secondary to renal dysfunction  4) constipation Previously on Linzess and laxative Previously seen by GI stable  5) insulin-dependent diabetes Managed by primary care Hemoglobin A1c 6.9  6) near syncope,  Periodic episodes of bradycardia Intermittent left bundle branch block High concern for sleep apnea Also at 3 AM concerning for vasovagal episode got up quickly in the setting of having to go to the bathroom By history no prior episodes of near syncope or syncope Event monitor could be worn at discharge for further evaluation Would recommend outpatient sleep study  Patient, nursing concerning events of last night, EP who will consult on the patient Telemetry reviewed extensively Greater than 50% was spent in counseling and coordination of care with patient Total encounter time long discussion with 35 minutes or more   Signed: Esmond Plants  M.D., Ph.D. [facility]

## 2017-06-29 NOTE — Care Management (Addendum)
Spoke with Dr. Anselm Jungling. Ordered MRI still with syncope? Bradycardia. Cardiology consult. Pt pending Shelbie Ammons RN MSN CCM Care Management [phone]

## 2017-06-29 NOTE — Progress Notes (Signed)
Pt back from nuclear medicine, vss, no distress noted

## 2017-06-29 NOTE — Progress Notes (Signed)
[facility] NAME: Jesse Dickson    MR#:  [id number]  DATE OF BIRTH:  27-Jul-1940  SUBJECTIVE:  CHIEF COMPLAINT:   Chief Complaint  Patient presents with  . Chest Pain     Came with SOB- also have some Bradycardia on tele.  Still complained of feeling dizzy and having frequent falls at home.  REVIEW OF SYSTEMS:  CONSTITUTIONAL: No fever, fatigue or weakness.  EYES: No blurred or double vision.  EARS, NOSE, AND THROAT: No tinnitus or ear pain.  RESPIRATORY: No cough, have shortness of breath, wheezing or hemoptysis.  CARDIOVASCULAR: No chest pain, orthopnea, edema.  GASTROINTESTINAL: No nausea, vomiting, diarrhea or abdominal pain.  GENITOURINARY: No dysuria, hematuria.  ENDOCRINE: No polyuria, nocturia,  HEMATOLOGY: No anemia, easy bruising or bleeding SKIN: No rash or lesion. MUSCULOSKELETAL: No joint pain or arthritis.   NEUROLOGIC: No tingling, numbness, weakness.  PSYCHIATRY: No anxiety or depression.   ROS  DRUG ALLERGIES:  No Known Allergies  VITALS:  Blood pressure (!) 104/56, pulse 76, temperature 98.3 F (36.8 C), temperature source Oral, resp. rate 18, height 5\' 8"  (1.727 m), weight 88.4 kg (194 lb 14.4 oz), SpO2 96 %.  PHYSICAL EXAMINATION:  GENERAL:  77 y.o.-year-old patient lying in the bed with no acute distress.  EYES: Pupils equal, round, reactive to light and accommodation. No scleral icterus. Extraocular muscles intact.  HEENT: Head atraumatic, normocephalic. Oropharynx and nasopharynx clear.  NECK:  Supple, no jugular venous distention. No thyroid enlargement, no tenderness.  LUNGS: Normal breath sounds bilaterally, no wheezing, some crepitation. No use of accessory muscles of respiration.  CARDIOVASCULAR: S1, S2 normal. No murmurs, rubs, or gallops.  ABDOMEN: Soft, nontender, nondistended. Bowel sounds present. No organomegaly or mass.  EXTREMITIES: No pedal edema, cyanosis, or clubbing.   NEUROLOGIC: Cranial nerves II through XII are intact. Muscle strength 4/5 in all extremities. Sensation intact. Gait not checked.  PSYCHIATRIC: The patient is alert and oriented x 3.  SKIN: No obvious rash, lesion, or ulcer.   Physical Exam LABORATORY PANEL:   CBC Recent Labs  Lab 06/28/17 0023  WBC 6.0  HGB 9.2*  HCT 28.6*  PLT 237   ------------------------------------------------------------------------------------------------------------------  Chemistries  Recent Labs  Lab 06/28/17 0023  NA 135  K 4.7  CL 104  CO2 27  GLUCOSE 276*  BUN 32*  CREATININE 1.83*  CALCIUM 8.9   ------------------------------------------------------------------------------------------------------------------  Cardiac Enzymes Recent Labs  Lab 06/28/17 1021 06/28/17 1707  TROPONINI 0.05* 0.06*   ------------------------------------------------------------------------------------------------------------------  RADIOLOGY:  Dg Chest 2 View  Result Date: 06/28/2017 CLINICAL DATA:  Mid chest pain x2 weeks with dyspnea. EXAM: CHEST - 2 VIEW COMPARISON:  06/26/2017 FINDINGS: Stable mild cardiomegaly. Pulmonary vascular congestion with bibasilar atelectasis and trace bilateral pleural effusions unchanged in appearance. No acute osseous abnormality. IMPRESSION: Stable cardiomegaly with mild vascular congestion, bibasilar atelectasis and trace bilateral pleural effusions. No significant change from prior. Electronically Signed   By: Ashley Royalty M.D.   On: 06/28/2017 01:10    ASSESSMENT AND PLAN:   Active Problems:   Chest pain  This is a 77 year old male admitted for chest pain. 1.  Chest pain: Mild elevated troponin.  Continue to monitor telemetry.  Follow cardiac biomarkers.  Consulted cardiology.    Continue aspirin   Echo within expected range, some elevated pulm pressure, but no contributory findings.  2.  CHF: Diastolic; acute on chronic.  Has bilateral pleural effusions as well as  trace nonpitting  edema of the lower extremities.   scheduled Lasix IV.  Daily weights.  Continue carvedilol and losartan  Due to borderline hypotension, stop Lasix and decrease dose of carvedilol now. 3.  CKD: Stage III; avoid nephrotoxic agents.  Sequelae of MGUS. 4.  Diabetes mellitus type 2: Continue basal insulin adjusted for hospital diet.  Sliding scale insulin as well 5.  Lipidemia: Continue statin therapy 6.  DVT prophylaxis: Heparin  .  GI prophylaxis: None 7. Bradycardia: decreased dose of coreg. 8. Feeling dizzy / presyncopal and frequent falls- completed physical therapy evaluation.   Cardiology suggested to do nuclear stress test to rule out CAD.   I will also do MRI of brain and carotid doppler to rule out stroke.   All the records are reviewed and case discussed with Care Management/Social Workerr. Management plans discussed with the patient, family and they are in agreement.  CODE STATUS: Full.  TOTAL TIME TAKING CARE OF THIS PATIENT: 35 minutes.  Discussed with his wife in room.  POSSIBLE D/C IN 1-2 DAYS, DEPENDING ON CLINICAL CONDITION.   Vaughan Basta M.D on 06/29/2017   Between 7am to 6pm - Pager - [phone]  After 6pm go to [email or web address] - password [email or web address] [facility]  Office  [phone]  CC: Primary care physician; Cletis Athens, MD  Note: This dictation was prepared with Dragon dictation along with smaller phrase technology. Any transcriptional errors that result from this process are unintentional.

## 2017-06-30 ENCOUNTER — Ambulatory Visit (INDEPENDENT_AMBULATORY_CARE_PROVIDER_SITE_OTHER): Payer: Medicare HMO

## 2017-06-30 ENCOUNTER — Observation Stay: Payer: Medicare HMO

## 2017-06-30 ENCOUNTER — Other Ambulatory Visit: Payer: Self-pay | Admitting: *Deleted

## 2017-06-30 DIAGNOSIS — E119 Type 2 diabetes mellitus without complications: Secondary | ICD-10-CM | POA: Diagnosis not present

## 2017-06-30 DIAGNOSIS — E782 Mixed hyperlipidemia: Secondary | ICD-10-CM | POA: Diagnosis not present

## 2017-06-30 DIAGNOSIS — M545 Low back pain: Secondary | ICD-10-CM | POA: Diagnosis not present

## 2017-06-30 DIAGNOSIS — K5904 Chronic idiopathic constipation: Secondary | ICD-10-CM | POA: Diagnosis not present

## 2017-06-30 DIAGNOSIS — I5032 Chronic diastolic (congestive) heart failure: Secondary | ICD-10-CM | POA: Diagnosis not present

## 2017-06-30 DIAGNOSIS — R001 Bradycardia, unspecified: Secondary | ICD-10-CM | POA: Diagnosis not present

## 2017-06-30 DIAGNOSIS — K5900 Constipation, unspecified: Secondary | ICD-10-CM

## 2017-06-30 DIAGNOSIS — R079 Chest pain, unspecified: Secondary | ICD-10-CM | POA: Diagnosis not present

## 2017-06-30 DIAGNOSIS — R55 Syncope and collapse: Secondary | ICD-10-CM

## 2017-06-30 DIAGNOSIS — N184 Chronic kidney disease, stage 4 (severe): Secondary | ICD-10-CM

## 2017-06-30 DIAGNOSIS — I5033 Acute on chronic diastolic (congestive) heart failure: Secondary | ICD-10-CM | POA: Diagnosis not present

## 2017-06-30 DIAGNOSIS — I6522 Occlusion and stenosis of left carotid artery: Secondary | ICD-10-CM | POA: Diagnosis not present

## 2017-06-30 DIAGNOSIS — I10 Essential (primary) hypertension: Secondary | ICD-10-CM | POA: Diagnosis not present

## 2017-06-30 DIAGNOSIS — E118 Type 2 diabetes mellitus with unspecified complications: Secondary | ICD-10-CM | POA: Diagnosis not present

## 2017-06-30 DIAGNOSIS — D631 Anemia in chronic kidney disease: Secondary | ICD-10-CM

## 2017-06-30 DIAGNOSIS — N183 Chronic kidney disease, stage 3 (moderate): Secondary | ICD-10-CM | POA: Diagnosis not present

## 2017-06-30 LAB — CBC
HEMATOCRIT: 24.8 % — AB (ref 40.0–52.0)
HEMOGLOBIN: 8.2 g/dL — AB (ref 13.0–18.0)
MCH: 29.9 pg (ref 26.0–34.0)
MCHC: 33.1 g/dL (ref 32.0–36.0)
MCV: 90.3 fL (ref 80.0–100.0)
Platelets: 205 10*3/uL (ref 150–440)
RBC: 2.75 MIL/uL — ABNORMAL LOW (ref 4.40–5.90)
RDW: 13.3 % (ref 11.5–14.5)
WBC: 6.4 10*3/uL (ref 3.8–10.6)

## 2017-06-30 LAB — LIPID PANEL
CHOL/HDL RATIO: 2.9 ratio
Cholesterol: 79 mg/dL (ref 0–200)
HDL: 27 mg/dL — ABNORMAL LOW (ref >40)
LDL Cholesterol: 43 mg/dL (ref 0–99)
Triglycerides: 46 mg/dL (ref <150)
VLDL: 9 mg/dL (ref 0–40)

## 2017-06-30 LAB — BASIC METABOLIC PANEL
Anion gap: 6 (ref 5–15)
BUN: 32 mg/dL — AB (ref 6–20)
CALCIUM: 8.9 mg/dL (ref 8.9–10.3)
CHLORIDE: 106 mmol/L (ref 101–111)
CO2: 26 mmol/L (ref 22–32)
CREATININE: 2.02 mg/dL — AB (ref 0.61–1.24)
GFR calc non Af Amer: 31 mL/min — ABNORMAL LOW (ref >60)
GFR, EST AFRICAN AMERICAN: 36 mL/min — AB (ref >60)
Glucose, Bld: 195 mg/dL — ABNORMAL HIGH (ref 65–99)
Potassium: 4.3 mmol/L (ref 3.5–5.1)
SODIUM: 138 mmol/L (ref 135–145)

## 2017-06-30 LAB — GLUCOSE, CAPILLARY
Glucose-Capillary: 131 mg/dL — ABNORMAL HIGH (ref 65–99)
Glucose-Capillary: 123 mg/dL — ABNORMAL HIGH (ref 65–99)

## 2017-06-30 MED ORDER — CARVEDILOL 3.125 MG PO TABS: 25.0000 mg | ORAL_TABLET | Freq: Two times a day (BID) | ORAL | 1 refills | Status: DC

## 2017-06-30 MED ORDER — LOSARTAN POTASSIUM 25 MG PO TABS: 12.5000 mg | ORAL_TABLET | Freq: Every day | ORAL | 1 refills | Status: DC

## 2017-06-30 MED ORDER — FUROSEMIDE 20 MG PO TABS: 20.0000 mg | ORAL_TABLET | ORAL | 0 refills | Status: DC

## 2017-06-30 NOTE — Care Management (Signed)
[facility] provided rollator walker and shower chair.  No there discharge needs identified.

## 2017-06-30 NOTE — Progress Notes (Addendum)
Progress Note  Patient Name: Jesse Dickson Date of Encounter: 06/30/2017  Primary Cardiologist: New to [facility] - consult by Rockey Situ  Subjective   More alert today. "Feels like asthma." No chest pain. MRI of the brain not acute. Carotid ultrasound pending. Myoview on 4/25 low risk. HGB down trending from 9.2-->8.2. Renal function 2.02, up from 1.82 on 4/24. BP remains overall soft.   Inpatient Medications    Scheduled Meds: . aspirin EC  81 mg Oral Daily  . atorvastatin  20 mg Oral Daily  . calcitRIOL  0.25 mcg Oral Daily  . carvedilol  6.25 mg Oral BID  . docusate sodium  100 mg Oral BID  . famotidine  20 mg Oral Daily  . gabapentin  200 mg Oral Daily  . heparin  5,000 Units Subcutaneous Q8H  . insulin aspart  0-9 Units Subcutaneous TID WC  . insulin glargine  5 Units Subcutaneous QHS  . linaclotide  290 mcg Oral QAC breakfast  . losartan  25 mg Oral Daily  . tamsulosin  0.4 mg Oral QHS   Continuous Infusions:  PRN Meds: acetaminophen **OR** acetaminophen, albuterol, ondansetron **OR** ondansetron (ZOFRAN) IV, polyethylene glycol   Vital Signs    Vitals:   06/29/17 1630 06/29/17 1938 06/29/17 1940 06/30/17 0349  BP: (!) 108/51 (!) 164/129 127/69 (!) 100/56  Pulse: 71 (!) 105 76 68  Resp: 18 18  18   Temp: 98.9 F (37.2 C) 99.1 F (37.3 C)  97.8 F (36.6 C)  TempSrc: Oral Oral    SpO2: 98% 99% 97% 95%  Weight:    198 lb (89.8 kg)  Height:        Intake/Output Summary (Last 24 hours) at 06/30/2017 0807 Last data filed at 06/30/2017 0656 Gross per 24 hour  Intake -  Output 475 ml  Net -475 ml   Filed Weights   06/28/17 0422 06/29/17 0403 06/30/17 0349  Weight: 197 lb 11.2 oz (89.7 kg) 194 lb 14.4 oz (88.4 kg) 198 lb (89.8 kg)    Telemetry    NSR with LBBB and 1st degree AV block - Personally Reviewed  ECG    n/a - Personally Reviewed  Physical Exam   GEN: No acute distress. More alert today.  Neck: No JVD. Cardiac: RRR, no murmurs, rubs, or  gallops.  Respiratory: Clear to auscultation bilaterally.  GI: Soft, nontender, non-distended.   MS: No edema; No deformity. Neuro:  Alert and oriented x 3; Nonfocal.  Psych: Normal affect.  Labs    Chemistry Recent Labs  Lab 06/26/17 1621 06/28/17 0023 06/30/17 0341  NA 138 135 138  K 4.7 4.7 4.3  CL 102 104 106  CO2 28 27 26   GLUCOSE 230* 276* 195*  BUN 35* 32* 32*  CREATININE 2.05* 1.83* 2.02*  CALCIUM 8.8* 8.9 8.9  GFRNONAA 30* 35* 31*  GFRAA 35* 40* 36*  ANIONGAP 8 4* 6     Hematology Recent Labs  Lab 06/26/17 1621 06/28/17 0023 06/30/17 0341  WBC 7.2 6.0 6.4  RBC 3.02* 3.15* 2.75*  HGB 9.1* 9.2* 8.2*  HCT 27.4* 28.6* 24.8*  MCV 90.8 90.7 90.3  MCH 30.0 29.2 29.9  MCHC 33.1 32.2 33.1  RDW 13.7 13.7 13.3  PLT 229 237 205    Cardiac Enzymes Recent Labs  Lab 06/28/17 0023 06/28/17 0605 06/28/17 1021 06/28/17 1707  TROPONINI 0.06* 0.05* 0.05* 0.06*   No results for input(s): TROPIPOC in the last 168 hours.   BNP Recent Labs  Lab 06/26/17 1621 06/28/17 0023  BNP 332.0* 362.0*     DDimer No results for input(s): DDIMER in the last 168 hours.   Radiology    Mr Brain Wo Contrast  Result Date: 06/29/2017 IMPRESSION: 1. No acute intracranial abnormality identified. 2. Small chronic infarction within the right frontal operculum. 3. Mild diffuse paranasal sinus disease and partial opacification of mastoid air cells. Electronically Signed   By: Kristine Garbe M.D.   On: 06/29/2017 22:02   Nm Myocar Multi W/spect W/wall Motion / Ef  Result Date: 06/29/2017 Pharmacological myocardial perfusion imaging study with no significant ischemia Uptake artifact noted in the basal lateral wall Normal wall motion, EF estimated at 51% (depressed secondary to attenuation artifact) No EKG changes concerning for ischemia at peak stress or in recovery.  recent echocardiogram confirms normal ejection fraction Low risk scan Signed, Esmond Plants, MD, Ph.D [facility]    Cardiac Studies   TTE 06/28/2017: Study Conclusions  - Left ventricle: The cavity size was normal. Systolic function was   normal. The estimated ejection fraction was in the range of 60%   to 65%. Wall motion was normal; there were no regional wall   motion abnormalities. Left ventricular diastolic function   parameters were normal. - Mitral valve: There was mild to moderate regurgitation. - Left atrium: The atrium was mildly dilated. - Right ventricle: Systolic function was normal. - Pulmonary arteries: Systolic pressure was mildly elevated. PA   peak pressure: 36 mm Hg (S).  Patient Profile     77 y.o. male with history of chronic diastolic CHF, CKD stage III, anemia of chronic disease, IDDM, HLD, HTN, chronic constipation, prostate cancer s/p prostatectomy, MGUS, GERD, and chronic low back painwho is being seen today for the evaluation of chest pain.  Assessment & Plan    1. Chest pain with moderate risk for cardiac etiology/elevated troponin: -Chest pain free -Troponin minimally elevated with a peak of 0.06, likley supply demand ischemia in the setting of volume overload, CKD, and anemia -Echo as above -Lexiscan 4/25 low risk -ASA  2. Unresponsive episode/syncope/fall: -Telemetry as above -EP evaluated on 4/25 and felt like this was not arrhythmia-mediated and more so a vasovagal event (their note is pending) -Orthostatic vital signs normal -MRI brain not acute -Carotid ultrasound pending -Plan to place a Zio monitor on today  3. Chronic diastolic CHF: -He appears euvolemic  -Echo as above -Continue to hold diuresis given soft BP and acute on CKD -Decrease Coreg to 3.125 mg bid given soft BP -Decrease losartan to 12.5 mg daily  4. CKD stage III: -Monitor   5. Constipation: -Per IM  6. Anemia of chronic disease: -Stable  7. HLD: -Statin -Check FLP  8. IDDM: -Per IM -Recommend A1c    For questions or updates, please contact [place] Please consult [email or web address] for contact info under Cardiology/STEMI.    Signed, Christell Faith, PA-C [place] Pager: [phone] 06/30/2017, 8:07 AM   Attending Note Patient seen and examined, agree with detailed note above,  Patient presentation and plan discussed on rounds.   Feels well with no complaints Blood pressure running low this morning around 151 systolic Has not been out of bed Did not eat much, does not like the food  On physical exam no JVD, lungs clear to auscultation bilaterally, heart sounds regular normal S1-S2 no murmurs appreciated abdomen soft nontender no significant lower extremity edema  Lab work reviewed showing stable creatinine 2.0 BUN 32 potassium 4.3 hematocrit dropping  24.8 with hemoglobin 8.2  Telemetry reviewed personally by myself showing short runs of bradycardia while sleeping overnight,  asymptomatic   A/P: 1) chronic diastolic CHF Mildly elevated right heart pressures  on echo on arrival mld lower extremity edema on arrival Follow-up in CHF clinic Discussed daily weights, minimizing fluid and salt intake Lasix on hold given low blood pressure We will have to hold his beta-blocker and ARB given low blood pressure, restart slowly as an outpatient  2) chest pain Normal ejection fraction greater than 55% Stress test with no ischemia Continued risk factors for ischemia include poorly controlled diabetes, hyperlipidemia  3) anemia Anemia is likely contributing to underlying shortness of breath Likely secondary to renal dysfunction slightly lower today,  may need to recheck prior to discharge As outpatient may need Epogen if secondary to renal dysfunction  4) constipation Previously on Linzess and laxative Previously seen by GI  5) insulin-dependent diabetes Managed by primary care Hemoglobin A1c 6.9 Stable  6) near syncope,  2 nights ago suspect vasovagal Seen by EP no pacemaker needed  7)  Bradycardia Presenting overnight very short episodes asymptomatic Suspect underlying sleep disorder Would recommend outpatient sleep study  Case discussed with Dr. Posey Pronto Greater than 50% was spent in counseling and coordination of care with patient Total encounter time 35 minutes or more   Signed: Esmond Plants  M.D., Ph.D. [facility]

## 2017-06-30 NOTE — Evaluation (Signed)
Physical Therapy Evaluation Patient Details Name: Jesse Dickson MRN: [id number] DOB: 10-12-1940 Today's Date: 06/30/2017   History of Present Illness  Pt is a 77 y/o M who presented with chest pain and has been followed by Cardiologist while in the hospital.  Pt with Bil pleural effusions and nonpitting edema BLEs.  Pt with syncopal episode during hospital stay.  MRI brain negative for stroke.  Pt's PMH includes prostate cancer, CHF, CKD.      Clinical Impression  Pt admitted with above diagnosis. Pt currently with functional limitations due to the deficits listed below (see PT Problem List). Mr. Forst denied dizziness or lightheadedness throughout session.  Vitals stable.  Pt ambulated 100 ft using RW per pt's preference as he feels more safe with use of RW.  Recommend rollator at d/c to allow pt to take seated rest breaks when he fatigues.  Recommend OPPT to address h/o back pain and balance/endurance impairments. Pt will benefit from skilled PT to increase their independence and safety with mobility to allow discharge to the venue listed below.      Follow Up Recommendations Outpatient PT    Equipment Recommendations  Other (comment)(rollator (RW if rollator unavailable))    Recommendations for Other Services       Precautions / Restrictions Precautions Precautions: Fall;Other (comment) Precaution Comments: syncopal episode this hospital admission Restrictions Weight Bearing Restrictions: No      Mobility  Bed Mobility Overal bed mobility: Independent             General bed mobility comments: No physical assist or cues needed.  Pt performs independently.   Transfers Overall transfer level: Needs assistance Equipment used: Rolling walker (2 wheeled) Transfers: Sit to/from Stand Sit to Stand: Supervision         General transfer comment: Pt requests to use RW as this makes him feel more safe.  He demonstrates proper technique and hand placement.  Pt steady.    Ambulation/Gait Ambulation/Gait assistance: Min guard Ambulation Distance (Feet): 100 Feet Assistive device: Rolling walker (2 wheeled) Gait Pattern/deviations: Step-through pattern;Trunk flexed Gait velocity: slightly decreased   General Gait Details: Trunk flexed due to LBP, cues to bring RW closer as he has tendency to push RW slightly too far ahead.  Pulse in the 70s and SpO2 in mid 90s while ambulating.  Pt denies any dizziness/lightheadedness.  Pt reports fatigue after ambulating 100 ft.   Stairs            Wheelchair Mobility    Modified Rankin (Stroke Patients Only)       Balance Overall balance assessment: Needs assistance Sitting-balance support: No upper extremity supported;Feet supported Sitting balance-Leahy Scale: Good     Standing balance support: No upper extremity supported;During functional activity Standing balance-Leahy Scale: Fair Standing balance comment: Pt able to stand statically and adjust pants without UE support but prefers RW for dynamic activities (walking)                             Pertinent Vitals/Pain Pain Assessment: No/denies pain    Home Living Family/patient expects to be discharged to:: Private residence Living Arrangements: Spouse/significant other Available Help at Discharge: Family;Available 24 hours/day Type of Home: House Home Access: Level entry     Home Layout: Two level;Able to live on main level with bedroom/bathroom Home Equipment: Kasandra Knudsen - single point      Prior Function Level of Independence: Independent with assistive device(s)  Comments: Pt reports he was ambulating with SPC and was very limited with how far he could ambulate (short household distances) due to fatigue and SOB.  Pt independent with ADLs.       Hand Dominance        Extremity/Trunk Assessment   Upper Extremity Assessment Upper Extremity Assessment: Overall WFL for tasks assessed    Lower Extremity  Assessment Lower Extremity Assessment: (BLE strength grossly 4/5)    Cervical / Trunk Assessment Cervical / Trunk Assessment: Other exceptions Cervical / Trunk Exceptions: h/o chronic back pain  Communication   Communication: No difficulties(heavy accent)  Cognition Arousal/Alertness: Awake/alert Behavior During Therapy: WFL for tasks assessed/performed Overall Cognitive Status: Within Functional Limits for tasks assessed                                        General Comments General comments (skin integrity, edema, etc.): BP 110/67 in sitting at start of session.  Recommending OPPT at d/c as pt report chornic back pain.  He has received OPPT x1 in the past but it was aquatic therapy and he said this was not good for his breathing.  Would likely benefit from land OPPT.     Exercises General Exercises - Lower Extremity Ankle Circles/Pumps: AROM;Both;10 reps;Seated Other Exercises Other Exercises: Encouraged pt to pace himself with his ambualtory distance when he is d/c to home.  Instructed pt to take seated rest breaks when he begins to feel fatigued.  Pt verbalized understanding.    Assessment/Plan    PT Assessment Patient needs continued PT services  PT Problem List Decreased strength;Decreased activity tolerance;Decreased balance;Decreased knowledge of use of DME;Decreased safety awareness;Pain       PT Treatment Interventions DME instruction;Gait training;Functional mobility training;Therapeutic exercise;Therapeutic activities;Patient/family education;Balance training;Neuromuscular re-education    PT Goals (Current goals can be found in the Care Plan section)  Acute Rehab PT Goals Patient Stated Goal: to go home and improve his strength and balance PT Goal Formulation: With patient Time For Goal Achievement: 07/14/17 Potential to Achieve Goals: Good    Frequency Min 2X/week   Barriers to discharge        Co-evaluation               AM-PAC PT  "6 Clicks" Daily Activity  Outcome Measure Difficulty turning over in bed (including adjusting bedclothes, sheets and blankets)?: None Difficulty moving from lying on back to sitting on the side of the bed? : None Difficulty sitting down on and standing up from a chair with arms (e.g., wheelchair, bedside commode, etc,.)?: A Little Help needed moving to and from a bed to chair (including a wheelchair)?: A Little Help needed walking in hospital room?: A Little Help needed climbing 3-5 steps with a railing? : A Little 6 Click Score: 20    End of Session Equipment Utilized During Treatment: Gait belt Activity Tolerance: Patient tolerated treatment well Patient left: in chair;with call bell/phone within reach;with chair alarm set Nurse Communication: Mobility status;Other (comment)(pulse, SpO2, BP) PT Visit Diagnosis: Pain;Unsteadiness on feet (R26.81);Other abnormalities of gait and mobility (R26.89);Muscle weakness (generalized) (M62.81) Pain - Right/Left: (lower) Pain - part of body: (back)    Time: 3557-3220 PT Time Calculation (min) (ACUTE ONLY): 26 min   Charges:   PT Evaluation $PT Eval Low Complexity: 1 Low PT Treatments $Gait Training: 8-22 mins   PT G Codes:  Collie Siad PT, DPT 06/30/2017, 12:21 PM

## 2017-06-30 NOTE — Plan of Care (Signed)
?  Problem: Clinical Measurements: ?Goal: Diagnostic test results will improve ?Outcome: Progressing ?  ?Problem: Safety: ?Goal: Ability to remain free from injury will improve ?Outcome: Progressing ?  ?

## 2017-06-30 NOTE — Progress Notes (Signed)
Waiting on walker to be delivered prior to discharging. Family at bedside. Patient refusing bed alarm - educated on safety.

## 2017-06-30 NOTE — Progress Notes (Signed)
Cheri Fowler to be D/C'd Home per MD order. Patient given discharge teaching and paperwork regarding medications, diet, follow-up appointments and activity. Patient understanding verbalized. No questions or complaints at this time. Skin condition as charted. IV and telemetry removed prior to leaving.  No further needs by Care Management/Social Work. Prescriptions sent to pharmacy.   An After Visit Summary was printed and given to the patient. Caregiver/family present during discharge teaching.     Terrilyn Saver

## 2017-06-30 NOTE — Discharge Summary (Signed)
[facility] NAME: Jesse Dickson    MR#:  [id number]  DATE OF BIRTH:  June 20, 1940  DATE OF ADMISSION:  06/28/2017 ADMITTING PHYSICIAN: Harrie Foreman, MD  DATE OF DISCHARGE: 06/30/2017  PRIMARY CARE PHYSICIAN: Cletis Athens, MD    ADMISSION DIAGNOSIS:  Pleural effusion [J90] Syncope, unspecified syncope type [R55] Chest pain, unspecified type [R07.9]  DISCHARGE DIAGNOSIS:   CHF acute on chronic mild diastolic Relative hypotension CKD-III Suspected sleep apnea --work up as out pt SECONDARY DIAGNOSIS:   Past Medical History:  Diagnosis Date  . Chronic diastolic CHF (congestive heart failure) ([place])    a. TTE 05/2015 EF > 55%  . Chronic kidney disease (CKD), stage III (moderate) (HCC)   . DVT (deep venous thrombosis) ([place])   . GERD (gastroesophageal reflux disease)   . Hypercholesteremia   . Hypertension   . Insulin dependent diabetes mellitus ([place])   . MGUS (monoclonal gammopathy of unknown significance)   . Prostate CA ([place]) 2015   only surgery per pt    HOSPITAL COURSE:  77 year old male admitted for chest pain.  1. Chest pain: Mild elevated troponin. Continue to monitor telemetry. Follow cardiac biomarkers.  - cardiology input appreciated. Dr Rockey Situ recommends to hold off coreg and losartan and decrease lasix to 20 mg twice a week -Continue aspirin -appears demand ischemia -Lexiscan low risk   Echo within expected range, some elevated pulm pressure, but no contributory findings. -orthostatic vitals ok   2. CHF: Diastolic; acute on chronic. Has bilateral pleural effusions as well as trace nonpitting edema of the lower extremities.  -received scheduled Lasix IV--now d/ced.  3. CKD: Stage III; avoid nephrotoxic agents. Sequelae of MGUS.  4. Diabetes mellitus type 2: Continue basal insulin adjusted for hospital diet. Sliding scale insulin as well  5. Lipidemia: Continue statin  therapy  6. DVT prophylaxis: Heparin   7. Suspected sleep apnea--evaluation as out pt by PCP or cardiology  8. Feeling dizzy / presyncopal and frequent falls- completed physical therapy evaluation--HHPT. -MRI brain negative for stroke.  D/w dr Rockey Situ D/w wife d/c plan   CONSULTS OBTAINED:  Treatment Team:  Minna Merritts, MD  DRUG ALLERGIES:  No Known Allergies  DISCHARGE MEDICATIONS:   Allergies as of 06/30/2017   No Known Allergies     Medication List    STOP taking these medications   docusate sodium 100 MG capsule Commonly known as:  COLACE   furosemide 40 MG tablet Commonly known as:  LASIX   traMADol 50 MG tablet Commonly known as:  ULTRAM     TAKE these medications   aspirin EC 81 MG tablet Take 81 mg by mouth daily.   atorvastatin 20 MG tablet Commonly known as:  LIPITOR Take 20 mg by mouth daily.   calcitRIOL 0.25 MCG capsule Commonly known as:  ROCALTROL Take 0.25 mcg by mouth daily.   carvedilol 3.125 MG tablet Commonly known as:  COREG Take 8 tablets (25 mg total) by mouth 2 (two) times daily with a meal. What changed:    medication strength  when to take this   famotidine 20 MG tablet Commonly known as:  PEPCID Take 20 mg by mouth daily.   gabapentin 100 MG capsule Commonly known as:  NEURONTIN Take 200 mg by mouth daily.   INSULIN SYRINGE 1CC/30GX5/16" 30G X 5/16" 1 ML Misc Use 1 Dose 4 (four) times daily.   linaclotide 290 MCG Caps capsule Commonly known as:  LINZESS Take 1 capsule (290 mcg total) by mouth daily before breakfast.   losartan 25 MG tablet Commonly known as:  COZAAR Take 0.5 tablets (12.5 mg total) by mouth daily. What changed:  how much to take   [facility] VERIO test strip Generic drug:  glucose blood CHECK DAILY   polyethylene glycol packet Commonly known as:  MIRALAX / GLYCOLAX Take 17 g by mouth daily as needed for mild constipation.   tamsulosin 0.4 MG Caps capsule Commonly known as:   FLOMAX Take 0.4 mg by mouth at bedtime.   TRESIBA FLEXTOUCH 100 UNIT/ML Sopn FlexTouch Pen Generic drug:  insulin degludec INJECT 30UNITS WITH AUTO-INJECTOR ONCE DAILY if CBG > 150 mg/dl   VENTOLIN HFA 108 (90 Base) MCG/ACT inhaler Generic drug:  albuterol INHALE 1 PUFF BY MOUTH 3 TIMES A DAY AS NEEDED            Durable Medical Equipment  (From admission, onward)        Start     Ordered   06/30/17 1200  For home use only DME Walker rolling  Once    Comments:  With seat (rollator)  Question:  Patient needs a walker to treat with the following condition  Answer:  Weakness   06/30/17 1159      If you experience worsening of your admission symptoms, develop shortness of breath, life threatening emergency, suicidal or homicidal thoughts you must seek medical attention immediately by calling 911 or calling your MD immediately  if symptoms less severe.  You Must read complete instructions/literature along with all the possible adverse reactions/side effects for all the Medicines you take and that have been prescribed to you. Take any new Medicines after you have completely understood and accept all the possible adverse reactions/side effects.   Please note  You were cared for by a hospitalist during your hospital stay. If you have any questions about your discharge medications or the care you received while you were in the hospital after you are discharged, you can call the unit and asked to speak with the hospitalist on call if the hospitalist that took care of you is not available. Once you are discharged, your primary care physician will handle any further medical issues. Please note that NO REFILLS for any discharge medications will be authorized once you are discharged, as it is imperative that you return to your primary care physician (or establish a relationship with a primary care physician if you do not have one) for your aftercare needs so that they can reassess your need for  medications and monitor your lab values. Today   SUBJECTIVE   Feels better today  VITAL SIGNS:  Blood pressure 114/63, pulse 75, temperature 98.7 F (37.1 C), temperature source Oral, resp. rate 16, height 5\' 8"  (1.727 m), weight 89.8 kg (198 lb), SpO2 100 %.  I/O:    Intake/Output Summary (Last 24 hours) at 06/30/2017 1205 Last data filed at 06/30/2017 0954 Gross per 24 hour  Intake 240 ml  Output 475 ml  Net -235 ml    PHYSICAL EXAMINATION:  GENERAL:  77 y.o.-year-old patient lying in the bed with no acute distress.  EYES: Pupils equal, round, reactive to light and accommodation. No scleral icterus. Extraocular muscles intact.  HEENT: Head atraumatic, normocephalic. Oropharynx and nasopharynx clear.  NECK:  Supple, no jugular venous distention. No thyroid enlargement, no tenderness.  LUNGS: Normal breath sounds bilaterally, no wheezing, rales,rhonchi or crepitation. No use of accessory muscles of respiration.  CARDIOVASCULAR:  S1, S2 normal. No murmurs, rubs, or gallops.  ABDOMEN: Soft, non-tender, non-distended. Bowel sounds present. No organomegaly or mass.  EXTREMITIES: No pedal edema, cyanosis, or clubbing.  NEUROLOGIC: Cranial nerves II through XII are intact. Muscle strength 5/5 in all extremities. Sensation intact. Gait not checked.  PSYCHIATRIC: The patient is alert and oriented x 3.  SKIN: No obvious rash, lesion, or ulcer.   DATA REVIEW:   CBC  Recent Labs  Lab 06/30/17 0341  WBC 6.4  HGB 8.2*  HCT 24.8*  PLT 205    Chemistries  Recent Labs  Lab 06/30/17 0341  NA 138  K 4.3  CL 106  CO2 26  GLUCOSE 195*  BUN 32*  CREATININE 2.02*  CALCIUM 8.9    Microbiology Results   No results found for this or any previous visit (from the past 240 hour(s)).  RADIOLOGY:  Mr Brain 55 Contrast  Result Date: 06/29/2017 CLINICAL DATA:  77 y/o M; chest pain and syncope. History of MGUS and prostate cancer. EXAM: MRI HEAD WITHOUT CONTRAST TECHNIQUE:  Multiplanar, multiecho pulse sequences of the brain and surrounding structures were obtained without intravenous contrast. COMPARISON:  None. FINDINGS: Brain: No acute infarction, hemorrhage, hydrocephalus, extra-axial collection or mass lesion. Small chronic infarction within the right frontal operculum extending to periventricular white matter. Vascular: Normal flow voids. Skull and upper cervical spine: Normal marrow signal. Sinuses/Orbits: Mild diffuse paranasal sinus mucosal thickening. Partial opacification of mastoid air cells. Orbits are unremarkable. Other: None. IMPRESSION: 1. No acute intracranial abnormality identified. 2. Small chronic infarction within the right frontal operculum. 3. Mild diffuse paranasal sinus disease and partial opacification of mastoid air cells. Electronically Signed   By: Kristine Garbe M.D.   On: 06/29/2017 22:02   US Carotid Bilateral  Result Date: 06/30/2017 CLINICAL DATA:  77 year old male with stroke-like symptoms EXAM: BILATERAL CAROTID DUPLEX ULTRASOUND TECHNIQUE: Pearline Cables scale imaging, color Doppler and duplex ultrasound were performed of bilateral carotid and vertebral arteries in the neck. COMPARISON:  Prior brain MRI 06/29/2017 FINDINGS: Criteria: Quantification of carotid stenosis is based on velocity parameters that correlate the residual internal carotid diameter with NASCET-based stenosis levels, using the diameter of the distal internal carotid lumen as the denominator for stenosis measurement. The following velocity measurements were obtained: RIGHT ICA: 108/34 cm/sec CCA: 47/42 cm/sec SYSTOLIC ICA/CCA RATIO:  1.1 ECA:  167 cm/sec LEFT ICA: 87/23 cm/sec CCA: 595/63 cm/sec SYSTOLIC ICA/CCA RATIO:  0.8 ECA:  122 cm/sec RIGHT CAROTID ARTERY: No significant atherosclerotic plaque or evidence of stenosis. RIGHT VERTEBRAL ARTERY:  Patent with normal antegrade flow. LEFT CAROTID ARTERY: Focal heterogeneous atherosclerotic plaque in the proximal internal  carotid artery. By peak systolic velocity criteria, the estimated stenosis remains less than 50%. LEFT VERTEBRAL ARTERY:  Patent with normal antegrade flow. IMPRESSION: 1. Mild (1-49%) stenosis proximal left internal carotid artery secondary to focal smooth heterogeneous atherosclerotic plaque. 2. No significant atherosclerotic plaque or evidence of stenosis in the right internal carotid artery. 3. Vertebral arteries are patent with normal antegrade flow. Signed, Criselda Peaches, MD [facility] Electronically Signed   By: Jacqulynn Cadet M.D.   On: 06/30/2017 09:33   Nm Myocar Multi W/spect W/wall Motion / Ef  Result Date: 06/29/2017 Pharmacological myocardial perfusion imaging study with no significant ischemia Uptake artifact noted in the basal lateral wall Normal wall motion, EF estimated at 51% (depressed secondary to attenuation artifact) No EKG changes concerning for ischemia at peak stress or in recovery.  recent  echocardiogram confirms normal ejection fraction Low risk scan Signed, Esmond Plants, MD, Ph.D [facility]     Management plans discussed with the patient, family and they are in agreement.  CODE STATUS:     Code Status Orders  (From admission, onward)        Start     Ordered   06/28/17 0417  Full code  Continuous     06/28/17 0416    Code Status History    This patient has a current code status but no historical code status.      TOTAL TIME TAKING CARE OF THIS PATIENT: *40* minutes.    Fritzi Mandes M.D on 06/30/2017 at 12:05 PM  Between 7am to 6pm - Pager - [phone] After 6pm go to [email or web address] - password [email or web address] [facility]  Office  [phone]  CC: Primary care physician; Cletis Athens, MD

## 2017-07-05 DIAGNOSIS — N183 Chronic kidney disease, stage 3 (moderate): Secondary | ICD-10-CM | POA: Diagnosis not present

## 2017-07-05 DIAGNOSIS — D631 Anemia in chronic kidney disease: Secondary | ICD-10-CM | POA: Diagnosis not present

## 2017-07-05 DIAGNOSIS — R140 Abdominal distension (gaseous): Secondary | ICD-10-CM | POA: Diagnosis not present

## 2017-07-05 DIAGNOSIS — R0602 Shortness of breath: Secondary | ICD-10-CM | POA: Diagnosis not present

## 2017-07-06 NOTE — Progress Notes (Signed)
Patient ID: Jesse Dickson, male    DOB: 07-May-1940, 77 y.o.   MRN: [id number]  HPI  Jesse Dickson is a 78 y/o male with a history of prostate cancer, DM, hyperlipidemia, HTN, CKD, DVT, GERD and chronic heart failure.   Echo report from 06/28/17 reviewed and showed an EF of 60-65% along with mild/mod MR and a mildly elevated PA pressure of 36 mm Hg.   Admitted 06/28/17 due to chest pain and acute on chronic HF. Cardiology consult obtained. Elevated troponin thought to be due to demand ischemia. Initially needed IV lasix and then transitioned to oral diuretics. Brain MRI done due to frequent falls which was negative for a stroke. Discharged after 2 days with home health.   He presents today for an initial visit with a chief complaint of minimal fatigue upon moderate exertion. He has associated edema along with this. He denies any difficulty sleeping, abdominal distention, palpitations, chest pain, shortness of breath or dizziness. Currently wearing a heart monitor for the next 2 weeks.   Past Medical History:  Diagnosis Date  . Chronic diastolic CHF (congestive heart failure) ([place])    a. TTE 05/2015 EF > 55%  . Chronic kidney disease (CKD), stage III (moderate) (HCC)   . DVT (deep venous thrombosis) ([place])   . GERD (gastroesophageal reflux disease)   . Hypercholesteremia   . Hypertension   . Insulin dependent diabetes mellitus ([place])   . MGUS (monoclonal gammopathy of unknown significance)   . Prostate CA ([place]) 2015   only surgery per pt   Past Surgical History:  Procedure Laterality Date  . PROSTATECTOMY     Family History  Problem Relation Age of Onset  . Diabetes Mother   . COPD Mother   . Cancer Sister   . Cancer Sister   . Dementia Sister    Social History   Tobacco Use  . Smoking status: Never Smoker  . Smokeless tobacco: Never Used  Substance Use Topics  . Alcohol use: No    Frequency: Never   No Known Allergies Prior to Admission medications   Medication Sig  Start Date End Date Taking? Authorizing Provider  aspirin EC 81 MG tablet Take 81 mg by mouth daily.    Yes [provider]  atorvastatin (LIPITOR) 20 MG tablet Take 20 mg by mouth daily.   Yes [provider]  furosemide (LASIX) 20 MG tablet Take 1 tablet (20 mg total) by mouth 2 (two) times a week. On Monday and thursdays only 07/03/17  Yes Fritzi Mandes, MD  gabapentin (NEURONTIN) 100 MG capsule Take 200 mg by mouth daily. 06/20/16  Yes [provider]  Insulin Syringe-Needle U-100 (INSULIN SYRINGE 1CC/30GX5/16") 30G X 5/16" 1 ML MISC Use 1 Dose 4 (four) times daily. 09/10/15  Yes [provider]  linaclotide Rolan Lipa) 290 MCG CAPS capsule Take 1 capsule (290 mcg total) by mouth daily before breakfast. 03/14/17 07/07/17 Yes Jonathon Bellows, MD  TRESIBA FLEXTOUCH 100 UNIT/ML SOPN FlexTouch Pen INJECT 30UNITS WITH AUTO-INJECTOR ONCE DAILY if CBG > 150 mg/dl 02/14/17  Yes [provider]  ONETOUCH VERIO test strip CHECK DAILY 01/21/17   [provider]  tamsulosin (FLOMAX) 0.4 MG CAPS capsule Take 0.4 mg by mouth at bedtime.    [provider]  VENTOLIN HFA 108 (90 Base) MCG/ACT inhaler INHALE 1 PUFF BY MOUTH 3 TIMES A DAY AS NEEDED 02/14/17   [provider]   Review of Systems  Constitutional: Positive for fatigue. Negative for appetite  change.  HENT: Negative for congestion, postnasal drip and sore throat.   Eyes: Negative.   Respiratory: Negative for chest tightness and shortness of breath.   Cardiovascular: Positive for leg swelling. Negative for chest pain and palpitations.  Gastrointestinal: Negative for abdominal distention and abdominal pain.  Endocrine: Negative.   Genitourinary: Negative.   Musculoskeletal: Positive for arthralgias (bilateral shoulder pain) and back pain.  Skin: Negative.   Allergic/Immunologic: Negative.   Neurological: Negative for dizziness and light-headedness.  Hematological: Negative for adenopathy.  Does not bruise/bleed easily.  Psychiatric/Behavioral: Negative for dysphoric mood and sleep disturbance (sleeping on 2 pillows). The patient is not nervous/anxious.     Vitals:   07/07/17 1241  BP: 119/65  Pulse: 84  Resp: 18  SpO2: 100%  Weight: 192 lb 4 oz (87.2 kg)  Height: 5\' 8"  (1.727 m)   Wt Readings from Last 3 Encounters:  07/07/17 192 lb 4 oz (87.2 kg)  06/30/17 198 lb (89.8 kg)  05/30/17 192 lb (87.1 kg)   Lab Results  Component Value Date   CREATININE 2.02 (H) 06/30/2017   CREATININE 1.83 (H) 06/28/2017   CREATININE 2.05 (H) 06/26/2017   Physical Exam  Constitutional: He appears well-developed and well-nourished.  HENT:  Head: Normocephalic and atraumatic.  Neck: Normal range of motion. Neck supple. No JVD present.  Cardiovascular: Normal rate and regular rhythm.  Pulmonary/Chest: Effort normal. No respiratory distress. He has no wheezes. He has no rales.  Abdominal: Soft. He exhibits no distension.  Musculoskeletal: He exhibits edema (1+ pitting edema in bilateral lower legs). He exhibits no deformity.  Skin: Skin is warm and dry.  Psychiatric: He has a normal mood and affect. His behavior is normal. Thought content normal.  Nursing note and vitals reviewed.   Assessment & Plan:  1: Chronic heart failure with preserved ejection fraction- - NYHA II - mildly fluid overloaded today with edema; currently taking furosemide twice weekly - not weighing daily as he doesn't have any scales. Set of scales given to patient and he was instructed to call for an overnight weight gain of >2 pounds or a weekly weight gain of >5 pounds - not adding salt but says that his food tastes terrible without salt. From [place] originally. Discussed the importance of reading food labels and to closely follow a 2000mg  diet and written dietary information was given to him about this - BNP on 06/28/17 was 362.0  2: HTN- - BP looks good today - saw PCP (Mas[facility]) 07/05/17 - BMP on 06/30/17  reviewed and showed sodium 138, potassium 4.3 and GFR 36  3: DM- - says that his fasting glucose at home this morning was 150 - A1c on 06/26/17 was 6.9%  4: Lymphedema- - elevating legs "some" and he was encouraged to elevate them when he's sitting for long periods of time - instructed him to get compression socks and wear them daily with putting them on first thing in the morning with removal at bedtime - consider lymphapress compression boots if edema persists  Medication list was reviewed.  Return in 1 month or sooner for any questions/problems before then.

## 2017-07-07 ENCOUNTER — Ambulatory Visit: Payer: Medicare HMO | Attending: Family | Admitting: Family

## 2017-07-07 ENCOUNTER — Encounter: Payer: Self-pay | Admitting: Family

## 2017-07-07 VITALS — BP 119/65 | HR 84 | Resp 18 | Ht 68.0 in | Wt 192.2 lb

## 2017-07-07 DIAGNOSIS — Z7982 Long term (current) use of aspirin: Secondary | ICD-10-CM | POA: Insufficient documentation

## 2017-07-07 DIAGNOSIS — Z833 Family history of diabetes mellitus: Secondary | ICD-10-CM | POA: Diagnosis not present

## 2017-07-07 DIAGNOSIS — Z825 Family history of asthma and other chronic lower respiratory diseases: Secondary | ICD-10-CM | POA: Diagnosis not present

## 2017-07-07 DIAGNOSIS — N183 Chronic kidney disease, stage 3 (moderate): Secondary | ICD-10-CM | POA: Insufficient documentation

## 2017-07-07 DIAGNOSIS — I10 Essential (primary) hypertension: Secondary | ICD-10-CM | POA: Insufficient documentation

## 2017-07-07 DIAGNOSIS — Z79899 Other long term (current) drug therapy: Secondary | ICD-10-CM | POA: Insufficient documentation

## 2017-07-07 DIAGNOSIS — I130 Hypertensive heart and chronic kidney disease with heart failure and stage 1 through stage 4 chronic kidney disease, or unspecified chronic kidney disease: Secondary | ICD-10-CM | POA: Insufficient documentation

## 2017-07-07 DIAGNOSIS — E7800 Pure hypercholesterolemia, unspecified: Secondary | ICD-10-CM | POA: Insufficient documentation

## 2017-07-07 DIAGNOSIS — I5032 Chronic diastolic (congestive) heart failure: Secondary | ICD-10-CM | POA: Insufficient documentation

## 2017-07-07 DIAGNOSIS — Z8546 Personal history of malignant neoplasm of prostate: Secondary | ICD-10-CM | POA: Insufficient documentation

## 2017-07-07 DIAGNOSIS — E119 Type 2 diabetes mellitus without complications: Secondary | ICD-10-CM

## 2017-07-07 DIAGNOSIS — Z86718 Personal history of other venous thrombosis and embolism: Secondary | ICD-10-CM | POA: Diagnosis not present

## 2017-07-07 DIAGNOSIS — Z794 Long term (current) use of insulin: Secondary | ICD-10-CM | POA: Diagnosis not present

## 2017-07-07 DIAGNOSIS — E1122 Type 2 diabetes mellitus with diabetic chronic kidney disease: Secondary | ICD-10-CM | POA: Diagnosis not present

## 2017-07-07 DIAGNOSIS — IMO0001 Reserved for inherently not codable concepts without codable children: Secondary | ICD-10-CM

## 2017-07-07 DIAGNOSIS — D472 Monoclonal gammopathy: Secondary | ICD-10-CM | POA: Insufficient documentation

## 2017-07-07 DIAGNOSIS — Z9079 Acquired absence of other genital organ(s): Secondary | ICD-10-CM | POA: Insufficient documentation

## 2017-07-07 DIAGNOSIS — I890 Lymphedema, not elsewhere classified: Secondary | ICD-10-CM | POA: Diagnosis not present

## 2017-07-07 DIAGNOSIS — K219 Gastro-esophageal reflux disease without esophagitis: Secondary | ICD-10-CM | POA: Insufficient documentation

## 2017-07-07 DIAGNOSIS — I509 Heart failure, unspecified: Secondary | ICD-10-CM | POA: Diagnosis present

## 2017-07-07 NOTE — Patient Instructions (Addendum)
Begin weighing daily and call for an overnight weight gain of > 2 pounds or a weekly weight gain of >5 pounds.  Drink 40-60 ounces of fluid daily.  Put compression socks on daily with removal at bedtime

## 2017-07-18 DIAGNOSIS — R69 Illness, unspecified: Secondary | ICD-10-CM | POA: Diagnosis not present

## 2017-07-19 DIAGNOSIS — N183 Chronic kidney disease, stage 3 (moderate): Secondary | ICD-10-CM | POA: Diagnosis not present

## 2017-07-19 DIAGNOSIS — C61 Malignant neoplasm of prostate: Secondary | ICD-10-CM | POA: Diagnosis not present

## 2017-07-19 DIAGNOSIS — R140 Abdominal distension (gaseous): Secondary | ICD-10-CM | POA: Diagnosis not present

## 2017-07-19 DIAGNOSIS — I10 Essential (primary) hypertension: Secondary | ICD-10-CM | POA: Diagnosis not present

## 2017-07-21 DIAGNOSIS — R55 Syncope and collapse: Secondary | ICD-10-CM | POA: Diagnosis not present

## 2017-08-01 ENCOUNTER — Telehealth: Payer: Self-pay | Admitting: Physician Assistant

## 2017-08-01 NOTE — Telephone Encounter (Signed)
recorded by Emily Filbert, RN on 08/01/2017 at 3:33 PM EDT The patient is aware of his results. I offered an appointment to see Dr. Caryl Comes on Thursday 08/03/17, but per the patient, he is going to [place] the end of this week through Monday 6/3. I have offered him an appointment on Tuesday 08/08/17 to see Dr. Caryl Comes at 8:45 am. The patient is agreeable. I have advised him twice during our conversation that he should not drive currently until he is evaluated by Dr. Caryl Comes. The patient verbalizes understanding. ------  Notes recorded by Rise Mu, PA-C on 08/01/2017 at 6:59 AM EDT Please call the patient. Monitor showed an overall rhythm of sinus with an average heart rate of 78 bpm.  1 run of SVT lasting 6 beats with a max rate of 110 bpm.  1 pause lasting 3 seconds at noon. Please clarify if the patient was sleeping at that time.  2nd degree AV block type I was noted.   Please schedule the patient to see Dr. Caryl Comes ASAP.  No driving at this time.

## 2017-08-04 ENCOUNTER — Telehealth: Payer: Self-pay | Admitting: Family

## 2017-08-04 ENCOUNTER — Ambulatory Visit: Payer: Medicare HMO | Admitting: Family

## 2017-08-04 NOTE — Telephone Encounter (Signed)
Patient did not show for his [facility] appointment on 08/04/17. Will attempt to reschedule.  

## 2017-08-08 ENCOUNTER — Encounter: Payer: Self-pay | Admitting: Internal Medicine

## 2017-08-08 ENCOUNTER — Ambulatory Visit: Payer: Medicare HMO | Admitting: Internal Medicine

## 2017-08-08 VITALS — BP 118/69 | HR 78 | Ht 68.0 in | Wt 192.8 lb

## 2017-08-08 DIAGNOSIS — I495 Sick sinus syndrome: Secondary | ICD-10-CM

## 2017-08-08 NOTE — Patient Instructions (Signed)
Medication Instructions: - Your physician recommends that you continue on your current medications as directed. Please refer to the Current Medication list given to you today.  Labwork: - none ordered  Procedures/Testing: - none ordered  Follow-Up: - Your physician recommends that you schedule a follow-up appointment in: 3 months with Dr. Caryl Comes.   Any Additional Special Instructions Will Be Listed Below (If Applicable). - please obtain and wear an abdominal binder during the day while you are awake    If you need a refill on your cardiac medications before your next appointment, please call your pharmacy.

## 2017-08-08 NOTE — Progress Notes (Signed)
ELECTROPHYSIOLOGY CONSULT NOTE  Patient ID: Jesse Dickson, MRN: [id number], DOB/AGE: 1940-09-03 77 y.o. Admit date: (Not on file) Date of Consult: 08/08/2017  Primary Physician: Cletis Athens, MD Primary Cardiologist: Deidre Ala     Jesse Dickson is a 77 y.o. male who is being seen today for the evaluation of Syncope at the request of TG.    HPI Jesse Dickson is a 77 y.o. male to making referred for syncope  He has a history of left ventricular hypertrophy and diastolic dysfunction.  Hospitalized 4/19 with chest discomfort and abdominal pain.  Troponins were mildly elevated thought to be demand. While in hospital he had syncope while up to urinate..  Reviewing the tracings at that time, I had noted that there was PR prolongation & PP prolongation  He has a history of orthostatic weakness.  This is relieved by sitting.  He has had one other episode of syncope.  This occurred following standing and walking towards his bedroom.  He has some post micturition lightheadedness.  No tachypalpitations.  They are concerned about his anemia.  He received transfusions 2015.  He is not aware of the diagnosis.   DATE TEST EF   3/17 Echo   55 %   4/19 Echo   60-65 % MR mild-mod  4/19 Myoview 51% No Ischemia      Date Cr K Hgb  4//19 2.02 4.3 8.2 Chronic           Past Medical History:  Diagnosis Date  . Chronic diastolic CHF (congestive heart failure) ([place])    a. TTE 05/2015 EF > 55%  . Chronic kidney disease (CKD), stage III (moderate) ([facility])   . DVT (deep venous thrombosis) ([place])   . GERD (gastroesophageal reflux disease)   . Hypercholesteremia   . Hypertension   . Insulin dependent diabetes mellitus ([place])   . MGUS (monoclonal gammopathy of unknown significance)   . Prostate CA ([place]) 2015   only surgery per pt      Surgical History:  Past Surgical History:  Procedure Laterality Date  . PROSTATECTOMY       Home Meds: Prior to Admission medications   Medication  Sig Start Date End Date Taking? Authorizing Provider  aspirin EC 81 MG tablet Take 81 mg by mouth daily.    Yes [provider]  atorvastatin (LIPITOR) 20 MG tablet Take 20 mg by mouth daily.   Yes [provider]  furosemide (LASIX) 20 MG tablet Take 1 tablet (20 mg total) by mouth 2 (two) times a week. On Monday and thursdays only 07/03/17  Yes Fritzi Mandes, MD  gabapentin (NEURONTIN) 100 MG capsule Take 200 mg by mouth daily. 06/20/16  Yes [provider]  Insulin Syringe-Needle U-100 (INSULIN SYRINGE 1CC/30GX5/16") 30G X 5/16" 1 ML MISC Use 1 Dose 4 (four) times daily. 09/10/15  Yes [provider]  linaclotide Rolan Lipa) 290 MCG CAPS capsule Take 1 capsule (290 mcg total) by mouth daily before breakfast. 03/14/17 08/08/17 Yes Jonathon Bellows, MD  [facility] VERIO test strip CHECK DAILY 01/21/17  Yes [provider]  tamsulosin (FLOMAX) 0.4 MG CAPS capsule Take 0.4 mg by mouth at bedtime.   Yes [provider]  TRESIBA FLEXTOUCH 100 UNIT/ML SOPN FlexTouch Pen INJECT 30UNITS WITH AUTO-INJECTOR ONCE DAILY if CBG > 150 mg/dl 02/14/17  Yes [provider]  VENTOLIN HFA 108 (90 Base) MCG/ACT inhaler INHALE 1 PUFF BY MOUTH 3 TIMES A DAY AS NEEDED 02/14/17  Yes [provider]  Allergies: No Known Allergies  Social History   Socioeconomic History  . Marital status: Married    Spouse name: Not on file  . Number of children: Not on file  . Years of education: Not on file  . Highest education level: Not on file  Occupational History  . Not on file  Social Needs  . Financial resource strain: Not on file  . Food insecurity:    Worry: Not on file    Inability: Not on file  . Transportation needs:    Medical: Not on file    Non-medical: Not on file  Tobacco Use  . Smoking status: Never Smoker  . Smokeless tobacco: Never Used  Substance and Sexual Activity  . Alcohol use: No    Frequency: Never  . Drug use: No  . Sexual activity:  Never  Lifestyle  . Physical activity:    Days per week: Not on file    Minutes per session: Not on file  . Stress: Not on file  Relationships  . Social connections:    Talks on phone: Not on file    Gets together: Not on file    Attends religious service: Not on file    Active member of club or organization: Not on file    Attends meetings of clubs or organizations: Not on file    Relationship status: Not on file  . Intimate partner violence:    Fear of current or ex partner: Not on file    Emotionally abused: Not on file    Physically abused: Not on file    Forced sexual activity: Not on file  Other Topics Concern  . Not on file  Social History Narrative  . Not on file     Family History  Problem Relation Age of Onset  . Diabetes Mother   . COPD Mother   . Cancer Sister   . Cancer Sister   . Dementia Sister      ROS:  Please see the history of present illness.     All other systems reviewed and negative.    Physical Exam: Blood pressure 118/69, pulse 78, height 5\' 8"  (1.727 m), weight 192 lb 12 oz (87.4 kg). General: Well developed, well nourished male in no acute distress. Head: Normocephalic, atraumatic, sclera non-icteric, no xanthomas, nares are without discharge. EENT: normal  Lymph Nodes:  none Neck: Negative for carotid bruits. JVD not elevated. Back:without scoliosis kyphosis Lungs: Clear bilaterally to auscultation without wheezes, rales, or rhonchi. Breathing is unlabored. Heart: RRR with S1 S2. No  murmur . No rubs, or gallops appreciated. Abdomen: Soft, non-tender, non-distended with normoactive bowel sounds. No hepatomegaly. No rebound/guarding. No obvious abdominal masses. Msk:  Strength and tone appear normal for age. Extremities: No clubbing or cyanosis.  2+  edema.  Distal pedal pulses are 2+ and equal bilaterally. Skin: Warm and Dry Neuro: Alert and oriented X 3. CN III-XII intact Grossly normal sensory and motor function . Psych:  Responds to  questions appropriately with a normal affect.      Labs: Cardiac Enzymes No results for input(s): CKTOTAL, CKMB, TROPONINI in the last 72 hours. CBC Lab Results  Component Value Date   WBC 6.4 06/30/2017   HGB 8.2 (L) 06/30/2017   HCT 24.8 (L) 06/30/2017   MCV 90.3 06/30/2017   PLT 205 06/30/2017   PROTIME: No results for input(s): LABPROT, INR in the last 72 hours. Chemistry No results for input(s): NA, K, CL, CO2, BUN, CREATININE, CALCIUM,  PROT, BILITOT, ALKPHOS, ALT, AST, GLUCOSE in the last 168 hours.  Invalid input(s): LABALBU Lipids Lab Results  Component Value Date   CHOL 79 06/30/2017   HDL 27 (L) 06/30/2017   LDLCALC 43 06/30/2017   TRIG 46 06/30/2017   BNP No results found for: PROBNP Thyroid Function Tests: No results for input(s): TSH, T4TOTAL, T3FREE, THYROIDAB in the last 72 hours.  Invalid input(s): FREET3 Miscellaneous No results found for: DDIMER  Radiology/Studies:  No results found.  EKG: Sinus at 78 intervals 27/09/40 Axis left -61 Poor R wave progression consistent with anteroseptal MI Inferior Q waves   Assessment and Plan:  Syncope associated with PR and PP prolongation and a sinus pause  Orthostatic intolerance  Diabetes with diabetic neuropathy  Grade 3/4 chronic kidney disease  Anemia  Left ventricular hypertrophy--MR-mild/moderate  Sleep disordered breathing and daytime somnolence   The patient has orthostatic intolerance in the context of long-standing diabetes with evidence of neuropathy.  It may further be aggravated by anemia because of which is not clear.  He has evidence of volume overload and management of which has been complicated by renal insufficiency.  His syncopal event was associate with hyper vagotonia as manifested by PP and PR prolongation.  Event of hospital occurred around the time of urinating; the patient's recollection was that it happened before.  Nursing records are not available to clarify.  He is  also had other episodes of presyncope particularly following standing.  We have reviewed the physiology of orthostatic intolerance and the importance of allowing his body to equilibrate for a few moments following standing as well as recognizing the prodrome of syncope.  Pharmacological measures could include Mestinon.  Nonpharmacological strategies which I recommended that we pursue initially would include an abdominal binder.  He also has sleep disordered breathing and would benefit from a sleep study.   Jesse Dickson

## 2017-08-16 ENCOUNTER — Encounter: Payer: Self-pay | Admitting: Family

## 2017-08-16 ENCOUNTER — Ambulatory Visit: Payer: Medicare HMO | Attending: Family | Admitting: Family

## 2017-08-16 VITALS — BP 122/69 | HR 88 | Resp 18 | Ht 68.0 in | Wt 193.1 lb

## 2017-08-16 DIAGNOSIS — K219 Gastro-esophageal reflux disease without esophagitis: Secondary | ICD-10-CM | POA: Insufficient documentation

## 2017-08-16 DIAGNOSIS — I130 Hypertensive heart and chronic kidney disease with heart failure and stage 1 through stage 4 chronic kidney disease, or unspecified chronic kidney disease: Secondary | ICD-10-CM | POA: Diagnosis not present

## 2017-08-16 DIAGNOSIS — R296 Repeated falls: Secondary | ICD-10-CM | POA: Insufficient documentation

## 2017-08-16 DIAGNOSIS — E1122 Type 2 diabetes mellitus with diabetic chronic kidney disease: Secondary | ICD-10-CM | POA: Diagnosis not present

## 2017-08-16 DIAGNOSIS — Z825 Family history of asthma and other chronic lower respiratory diseases: Secondary | ICD-10-CM | POA: Diagnosis not present

## 2017-08-16 DIAGNOSIS — IMO0001 Reserved for inherently not codable concepts without codable children: Secondary | ICD-10-CM

## 2017-08-16 DIAGNOSIS — I10 Essential (primary) hypertension: Secondary | ICD-10-CM

## 2017-08-16 DIAGNOSIS — Z79899 Other long term (current) drug therapy: Secondary | ICD-10-CM | POA: Diagnosis not present

## 2017-08-16 DIAGNOSIS — E7800 Pure hypercholesterolemia, unspecified: Secondary | ICD-10-CM | POA: Insufficient documentation

## 2017-08-16 DIAGNOSIS — N183 Chronic kidney disease, stage 3 (moderate): Secondary | ICD-10-CM | POA: Diagnosis not present

## 2017-08-16 DIAGNOSIS — E785 Hyperlipidemia, unspecified: Secondary | ICD-10-CM | POA: Diagnosis not present

## 2017-08-16 DIAGNOSIS — Z8546 Personal history of malignant neoplasm of prostate: Secondary | ICD-10-CM | POA: Diagnosis not present

## 2017-08-16 DIAGNOSIS — Z7982 Long term (current) use of aspirin: Secondary | ICD-10-CM | POA: Diagnosis not present

## 2017-08-16 DIAGNOSIS — I890 Lymphedema, not elsewhere classified: Secondary | ICD-10-CM | POA: Diagnosis not present

## 2017-08-16 DIAGNOSIS — Z833 Family history of diabetes mellitus: Secondary | ICD-10-CM | POA: Diagnosis not present

## 2017-08-16 DIAGNOSIS — E119 Type 2 diabetes mellitus without complications: Secondary | ICD-10-CM

## 2017-08-16 DIAGNOSIS — Z794 Long term (current) use of insulin: Secondary | ICD-10-CM | POA: Diagnosis not present

## 2017-08-16 DIAGNOSIS — I5032 Chronic diastolic (congestive) heart failure: Secondary | ICD-10-CM

## 2017-08-16 NOTE — Progress Notes (Signed)
Patient ID: Jesse Dickson, male    DOB: October 14, 1940, 77 y.o.   MRN: [id number]  HPI  Mr. Portocarrero is a 77 y/o male with a history of prostate cancer, DM, hyperlipidemia, HTN, CKD, DVT, GERD and chronic heart failure.   Echo report from 06/28/17 reviewed and showed an EF of 60-65% along with mild/mod MR and a mildly elevated PA pressure of 36 mm Hg.   Admitted 06/28/17 due to chest pain and acute on chronic HF. Cardiology consult obtained. Elevated troponin thought to be due to demand ischemia. Initially needed IV lasix and then transitioned to oral diuretics. Brain MRI done due to frequent falls which was negative for a stroke. Discharged after 2 days with home health.   He presents today for a follow-up visit with a chief complaint of minimal shortness of breath upon moderate exertion. He describes this as chronic in nature having been present for several years. He has associated fatigue, pedal edema and back pain along with this. He denies any difficulty sleeping, abdominal distention, palpitations, chest pain, dizziness or weight gain.   Past Medical History:  Diagnosis Date  . Chronic diastolic CHF (congestive heart failure) ([place])    a. TTE 05/2015 EF > 55%  . Chronic kidney disease (CKD), stage III (moderate) ([facility])   . DVT (deep venous thrombosis) ([place])   . GERD (gastroesophageal reflux disease)   . Hypercholesteremia   . Hypertension   . Insulin dependent diabetes mellitus ([place])   . MGUS (monoclonal gammopathy of unknown significance)   . Prostate CA ([place]) 2015   only surgery per pt   Past Surgical History:  Procedure Laterality Date  . PROSTATECTOMY     Family History  Problem Relation Age of Onset  . Diabetes Mother   . COPD Mother   . Cancer Sister   . Cancer Sister   . Dementia Sister    Social History   Tobacco Use  . Smoking status: Never Smoker  . Smokeless tobacco: Never Used  Substance Use Topics  . Alcohol use: No    Frequency: Never   No Known  Allergies  Prior to Admission medications   Medication Sig Start Date End Date Taking? Authorizing Provider  aspirin EC 81 MG tablet Take 81 mg by mouth daily.    Yes [provider]  atorvastatin (LIPITOR) 20 MG tablet Take 20 mg by mouth daily.   Yes [provider]  furosemide (LASIX) 20 MG tablet Take 1 tablet (20 mg total) by mouth 2 (two) times a week. On Monday and thursdays only 07/03/17  Yes Fritzi Mandes, MD  gabapentin (NEURONTIN) 100 MG capsule Take 200 mg by mouth daily. 06/20/16  Yes [provider]  Insulin Syringe-Needle U-100 (INSULIN SYRINGE 1CC/30GX5/16") 30G X 5/16" 1 ML MISC Use 1 Dose 4 (four) times daily. 09/10/15  Yes [provider]  linaclotide Rolan Lipa) 290 MCG CAPS capsule Take 1 capsule (290 mcg total) by mouth daily before breakfast. 03/14/17 08/16/17 Yes Jonathon Bellows, MD  [facility] VERIO test strip CHECK DAILY 01/21/17  Yes [provider]  TRESIBA FLEXTOUCH 100 UNIT/ML SOPN FlexTouch Pen INJECT 30UNITS WITH AUTO-INJECTOR ONCE DAILY if CBG > 150 mg/dl 02/14/17  Yes [provider]  tamsulosin (FLOMAX) 0.4 MG CAPS capsule Take 0.4 mg by mouth at bedtime.    [provider]  VENTOLIN HFA 108 (90 Base) MCG/ACT inhaler INHALE 1 PUFF BY MOUTH 3 TIMES A DAY AS NEEDED 02/14/17   [provider]  Review of Systems  Constitutional: Positive for fatigue. Negative for appetite change.  HENT: Negative for congestion, postnasal drip and sore throat.   Eyes: Negative.   Respiratory: Positive for shortness of breath. Negative for chest tightness.   Cardiovascular: Positive for leg swelling. Negative for chest pain and palpitations.  Gastrointestinal: Negative for abdominal distention and abdominal pain.  Endocrine: Negative.   Genitourinary: Negative.   Musculoskeletal: Positive for back pain. Negative for arthralgias.  Skin: Negative.   Allergic/Immunologic: Negative.   Neurological: Negative for dizziness and  light-headedness.  Hematological: Negative for adenopathy. Does not bruise/bleed easily.  Psychiatric/Behavioral: Negative for dysphoric mood and sleep disturbance (sleeping on 2 pillows). The patient is not nervous/anxious.    Vitals:   08/16/17 1156  BP: 122/69  Pulse: 88  Resp: 18  SpO2: 99%  Weight: 193 lb 2 oz (87.6 kg)  Height: 5\' 8"  (1.727 m)   Wt Readings from Last 3 Encounters:  08/16/17 193 lb 2 oz (87.6 kg)  08/08/17 192 lb 12 oz (87.4 kg)  07/07/17 192 lb 4 oz (87.2 kg)   Lab Results  Component Value Date   CREATININE 2.02 (H) 06/30/2017   CREATININE 1.83 (H) 06/28/2017   CREATININE 2.05 (H) 06/26/2017   Physical Exam  Constitutional: He appears well-developed and well-nourished.  HENT:  Head: Normocephalic and atraumatic.  Neck: Normal range of motion. Neck supple. No JVD present.  Cardiovascular: Normal rate and regular rhythm.  Pulmonary/Chest: Effort normal. No respiratory distress. He has no wheezes. He has no rales.  Abdominal: Soft. He exhibits no distension.  Musculoskeletal: He exhibits edema (1+ pitting edema in bilateral lower legs). He exhibits no deformity.  Skin: Skin is warm and dry.  Psychiatric: He has a normal mood and affect. His behavior is normal. Thought content normal.  Nursing note and vitals reviewed.   Assessment & Plan:  1: Chronic heart failure with preserved ejection fraction- - NYHA II - mildly fluid overloaded today with edema; currently taking furosemide twice weekly. In the future may have to increase frequency of this - weighing daily; reminded to call for an overnight weight gain of >2 pounds or a weekly weight gain of >5 pounds - weight stable from last time he was here - not adding salt but says that his food tastes terrible without salt. From [place] originally. Reminded about the importance of reading food labels and to closely follow a 2000mg  diet  - drinking ~ 36 ounces of fluid daily. Discussed increasing his daily  fluid intake to closer to 60 ounces daily - BNP on 06/28/17 was 362.0  2: HTN- - BP looks good today - saw PCP (Masoud) 07/05/17 - BMP on 06/30/17 reviewed and showed sodium 138, potassium 4.3 and GFR 36  3: DM- - says that his fasting glucose at home this morning was 138 - A1c on 06/26/17 was 6.9%  4: Lymphedema- - stage 2 - not elevating legs much and he was encouraged to elevate them when sitting for long periods of time - instructed him to get compression socks and wear them daily with putting them on first thing in the morning with removal at bedtime - consider lymphapress compression boots if edema persists  Medication list was reviewed.  Return in 2 months or sooner for any questions/problems before then

## 2017-08-16 NOTE — Patient Instructions (Addendum)
Continue weighing daily and call for an overnight weight gain of > 2 pounds or a weekly weight gain of >5 pounds.  Increase fluid intake to 60 ounces daily.  Wear support socks daily with removal at bedtime. Elevate legs when sitting

## 2017-08-18 DIAGNOSIS — R140 Abdominal distension (gaseous): Secondary | ICD-10-CM | POA: Diagnosis not present

## 2017-08-18 DIAGNOSIS — D631 Anemia in chronic kidney disease: Secondary | ICD-10-CM | POA: Diagnosis not present

## 2017-08-18 DIAGNOSIS — N183 Chronic kidney disease, stage 3 (moderate): Secondary | ICD-10-CM | POA: Diagnosis not present

## 2017-08-18 DIAGNOSIS — C61 Malignant neoplasm of prostate: Secondary | ICD-10-CM | POA: Diagnosis not present

## 2017-08-29 ENCOUNTER — Inpatient Hospital Stay: Payer: Medicare HMO | Attending: Oncology

## 2017-08-29 DIAGNOSIS — D8989 Other specified disorders involving the immune mechanism, not elsewhere classified: Secondary | ICD-10-CM

## 2017-08-29 DIAGNOSIS — C9000 Multiple myeloma not having achieved remission: Secondary | ICD-10-CM | POA: Diagnosis not present

## 2017-08-29 LAB — COMPREHENSIVE METABOLIC PANEL
ALT: 12 U/L (ref 0–44)
ANION GAP: 9 (ref 5–15)
AST: 15 U/L (ref 15–41)
Albumin: 3.6 g/dL (ref 3.5–5.0)
Alkaline Phosphatase: 122 U/L (ref 38–126)
BUN: 23 mg/dL (ref 8–23)
CHLORIDE: 106 mmol/L (ref 98–111)
CO2: 25 mmol/L (ref 22–32)
Calcium: 9.5 mg/dL (ref 8.9–10.3)
Creatinine, Ser: 1.66 mg/dL — ABNORMAL HIGH (ref 0.61–1.24)
GFR calc Af Amer: 45 mL/min — ABNORMAL LOW (ref >60)
GFR calc non Af Amer: 39 mL/min — ABNORMAL LOW (ref >60)
Glucose, Bld: 214 mg/dL — ABNORMAL HIGH (ref 70–99)
POTASSIUM: 5.1 mmol/L (ref 3.5–5.1)
SODIUM: 140 mmol/L (ref 135–145)
TOTAL PROTEIN: 7 g/dL (ref 6.5–8.1)
Total Bilirubin: 0.7 mg/dL (ref 0.3–1.2)

## 2017-08-29 LAB — CBC WITH DIFFERENTIAL/PLATELET
BASOS ABS: 0.1 10*3/uL (ref 0–0.1)
Basophils Relative: 1 %
Eosinophils Absolute: 0.2 10*3/uL (ref 0–0.7)
Eosinophils Relative: 3 %
HEMATOCRIT: 28.8 % — AB (ref 40.0–52.0)
Hemoglobin: 9.6 g/dL — ABNORMAL LOW (ref 13.0–18.0)
LYMPHS ABS: 2.1 10*3/uL (ref 1.0–3.6)
LYMPHS PCT: 30 %
MCH: 30 pg (ref 26.0–34.0)
MCHC: 33.3 g/dL (ref 32.0–36.0)
MCV: 90.2 fL (ref 80.0–100.0)
MONO ABS: 0.6 10*3/uL (ref 0.2–1.0)
MONOS PCT: 9 %
NEUTROS ABS: 3.9 10*3/uL (ref 1.4–6.5)
Neutrophils Relative %: 57 %
Platelets: 235 10*3/uL (ref 150–440)
RBC: 3.2 MIL/uL — ABNORMAL LOW (ref 4.40–5.90)
RDW: 13.2 % (ref 11.5–14.5)
WBC: 6.9 10*3/uL (ref 3.8–10.6)

## 2017-08-30 LAB — KAPPA/LAMBDA LIGHT CHAINS
KAPPA FREE LGHT CHN: 21.3 mg/L — AB (ref 3.3–19.4)
Kappa, lambda light chain ratio: 0.02 — ABNORMAL LOW (ref 0.26–1.65)
LAMDA FREE LIGHT CHAINS: 957.3 mg/L — AB (ref 5.7–26.3)

## 2017-08-31 LAB — MULTIPLE MYELOMA PANEL, SERUM
ALBUMIN/GLOB SERPL: 1.1 (ref 0.7–1.7)
Albumin SerPl Elph-Mcnc: 3.3 g/dL (ref 2.9–4.4)
Alpha 1: 0.3 g/dL (ref 0.0–0.4)
Alpha2 Glob SerPl Elph-Mcnc: 1.1 g/dL — ABNORMAL HIGH (ref 0.4–1.0)
B-Globulin SerPl Elph-Mcnc: 0.9 g/dL (ref 0.7–1.3)
GLOBULIN, TOTAL: 3.1 g/dL (ref 2.2–3.9)
Gamma Glob SerPl Elph-Mcnc: 0.8 g/dL (ref 0.4–1.8)
IGG (IMMUNOGLOBIN G), SERUM: 1037 mg/dL (ref 700–1600)
IgA: 139 mg/dL (ref 61–437)
IgM (Immunoglobulin M), Srm: 36 mg/dL (ref 15–143)
Total Protein ELP: 6.4 g/dL (ref 6.0–8.5)

## 2017-09-11 DIAGNOSIS — R69 Illness, unspecified: Secondary | ICD-10-CM | POA: Diagnosis not present

## 2017-09-15 DIAGNOSIS — E119 Type 2 diabetes mellitus without complications: Secondary | ICD-10-CM | POA: Diagnosis not present

## 2017-09-15 DIAGNOSIS — I509 Heart failure, unspecified: Secondary | ICD-10-CM | POA: Diagnosis not present

## 2017-09-15 DIAGNOSIS — M5440 Lumbago with sciatica, unspecified side: Secondary | ICD-10-CM | POA: Diagnosis not present

## 2017-09-15 DIAGNOSIS — R0602 Shortness of breath: Secondary | ICD-10-CM | POA: Diagnosis not present

## 2017-09-19 ENCOUNTER — Inpatient Hospital Stay
Admission: EM | Admit: 2017-09-19 | Discharge: 2017-09-22 | DRG: 291 | Disposition: A | Discharge status: Home Health | Payer: Medicare HMO | Attending: Internal Medicine | Admitting: Internal Medicine

## 2017-09-19 ENCOUNTER — Other Ambulatory Visit: Payer: Self-pay

## 2017-09-19 ENCOUNTER — Emergency Department: Payer: Medicare HMO

## 2017-09-19 ENCOUNTER — Inpatient Hospital Stay (HOSPITAL_COMMUNITY)
Admit: 2017-09-19 | Discharge: 2017-09-19 | Disposition: A | Discharge status: Home / Self Care | Payer: Medicare HMO | Attending: Internal Medicine | Admitting: Internal Medicine

## 2017-09-19 DIAGNOSIS — R7989 Other specified abnormal findings of blood chemistry: Secondary | ICD-10-CM

## 2017-09-19 DIAGNOSIS — Z8546 Personal history of malignant neoplasm of prostate: Secondary | ICD-10-CM | POA: Diagnosis not present

## 2017-09-19 DIAGNOSIS — R0602 Shortness of breath: Secondary | ICD-10-CM

## 2017-09-19 DIAGNOSIS — N179 Acute kidney failure, unspecified: Secondary | ICD-10-CM | POA: Diagnosis present

## 2017-09-19 DIAGNOSIS — E1122 Type 2 diabetes mellitus with diabetic chronic kidney disease: Secondary | ICD-10-CM | POA: Diagnosis present

## 2017-09-19 DIAGNOSIS — N183 Chronic kidney disease, stage 3 unspecified: Secondary | ICD-10-CM

## 2017-09-19 DIAGNOSIS — I340 Nonrheumatic mitral (valve) insufficiency: Secondary | ICD-10-CM | POA: Diagnosis not present

## 2017-09-19 DIAGNOSIS — E785 Hyperlipidemia, unspecified: Secondary | ICD-10-CM | POA: Diagnosis present

## 2017-09-19 DIAGNOSIS — I248 Other forms of acute ischemic heart disease: Secondary | ICD-10-CM | POA: Diagnosis present

## 2017-09-19 DIAGNOSIS — I959 Hypotension, unspecified: Secondary | ICD-10-CM | POA: Diagnosis present

## 2017-09-19 DIAGNOSIS — E1140 Type 2 diabetes mellitus with diabetic neuropathy, unspecified: Secondary | ICD-10-CM | POA: Diagnosis present

## 2017-09-19 DIAGNOSIS — M545 Low back pain: Secondary | ICD-10-CM | POA: Diagnosis present

## 2017-09-19 DIAGNOSIS — G8929 Other chronic pain: Secondary | ICD-10-CM | POA: Diagnosis present

## 2017-09-19 DIAGNOSIS — G250 Essential tremor: Secondary | ICD-10-CM | POA: Diagnosis present

## 2017-09-19 DIAGNOSIS — I130 Hypertensive heart and chronic kidney disease with heart failure and stage 1 through stage 4 chronic kidney disease, or unspecified chronic kidney disease: Secondary | ICD-10-CM | POA: Diagnosis not present

## 2017-09-19 DIAGNOSIS — I441 Atrioventricular block, second degree: Secondary | ICD-10-CM | POA: Diagnosis present

## 2017-09-19 DIAGNOSIS — Z86718 Personal history of other venous thrombosis and embolism: Secondary | ICD-10-CM | POA: Diagnosis not present

## 2017-09-19 DIAGNOSIS — I110 Hypertensive heart disease with heart failure: Secondary | ICD-10-CM | POA: Diagnosis not present

## 2017-09-19 DIAGNOSIS — Z79899 Other long term (current) drug therapy: Secondary | ICD-10-CM

## 2017-09-19 DIAGNOSIS — Z136 Encounter for screening for cardiovascular disorders: Secondary | ICD-10-CM | POA: Diagnosis not present

## 2017-09-19 DIAGNOSIS — Z7982 Long term (current) use of aspirin: Secondary | ICD-10-CM | POA: Diagnosis not present

## 2017-09-19 DIAGNOSIS — K210 Gastro-esophageal reflux disease with esophagitis: Secondary | ICD-10-CM | POA: Diagnosis present

## 2017-09-19 DIAGNOSIS — Z833 Family history of diabetes mellitus: Secondary | ICD-10-CM

## 2017-09-19 DIAGNOSIS — I10 Essential (primary) hypertension: Secondary | ICD-10-CM | POA: Diagnosis not present

## 2017-09-19 DIAGNOSIS — T502X5A Adverse effect of carbonic-anhydrase inhibitors, benzothiadiazides and other diuretics, initial encounter: Secondary | ICD-10-CM | POA: Diagnosis present

## 2017-09-19 DIAGNOSIS — E1121 Type 2 diabetes mellitus with diabetic nephropathy: Secondary | ICD-10-CM | POA: Diagnosis present

## 2017-09-19 DIAGNOSIS — I509 Heart failure, unspecified: Secondary | ICD-10-CM | POA: Diagnosis not present

## 2017-09-19 DIAGNOSIS — E119 Type 2 diabetes mellitus without complications: Secondary | ICD-10-CM | POA: Diagnosis not present

## 2017-09-19 DIAGNOSIS — I5033 Acute on chronic diastolic (congestive) heart failure: Secondary | ICD-10-CM | POA: Diagnosis not present

## 2017-09-19 DIAGNOSIS — E7800 Pure hypercholesterolemia, unspecified: Secondary | ICD-10-CM | POA: Diagnosis not present

## 2017-09-19 DIAGNOSIS — I455 Other specified heart block: Secondary | ICD-10-CM | POA: Diagnosis not present

## 2017-09-19 DIAGNOSIS — R809 Proteinuria, unspecified: Secondary | ICD-10-CM | POA: Diagnosis not present

## 2017-09-19 DIAGNOSIS — R748 Abnormal levels of other serum enzymes: Secondary | ICD-10-CM | POA: Diagnosis not present

## 2017-09-19 DIAGNOSIS — D631 Anemia in chronic kidney disease: Secondary | ICD-10-CM | POA: Diagnosis present

## 2017-09-19 DIAGNOSIS — R778 Other specified abnormalities of plasma proteins: Secondary | ICD-10-CM

## 2017-09-19 DIAGNOSIS — Z794 Long term (current) use of insulin: Secondary | ICD-10-CM

## 2017-09-19 DIAGNOSIS — I129 Hypertensive chronic kidney disease with stage 1 through stage 4 chronic kidney disease, or unspecified chronic kidney disease: Secondary | ICD-10-CM | POA: Diagnosis not present

## 2017-09-19 DIAGNOSIS — D472 Monoclonal gammopathy: Secondary | ICD-10-CM | POA: Diagnosis present

## 2017-09-19 LAB — BASIC METABOLIC PANEL
ANION GAP: 12 (ref 5–15)
BUN: 24 mg/dL — ABNORMAL HIGH (ref 8–23)
CHLORIDE: 106 mmol/L (ref 98–111)
CO2: 24 mmol/L (ref 22–32)
CREATININE: 1.68 mg/dL — AB (ref 0.61–1.24)
Calcium: 9.3 mg/dL (ref 8.9–10.3)
GFR calc non Af Amer: 38 mL/min — ABNORMAL LOW (ref >60)
GFR, EST AFRICAN AMERICAN: 45 mL/min — AB (ref >60)
Glucose, Bld: 240 mg/dL — ABNORMAL HIGH (ref 70–99)
Potassium: 3.7 mmol/L (ref 3.5–5.1)
Sodium: 142 mmol/L (ref 135–145)

## 2017-09-19 LAB — CBC
HCT: 27.8 % — ABNORMAL LOW (ref 40.0–52.0)
HEMOGLOBIN: 9.2 g/dL — AB (ref 13.0–18.0)
MCH: 30 pg (ref 26.0–34.0)
MCHC: 33 g/dL (ref 32.0–36.0)
MCV: 91.1 fL (ref 80.0–100.0)
PLATELETS: 200 10*3/uL (ref 150–440)
RBC: 3.06 MIL/uL — AB (ref 4.40–5.90)
RDW: 13.5 % (ref 11.5–14.5)
WBC: 10.4 10*3/uL (ref 3.8–10.6)

## 2017-09-19 LAB — TROPONIN I
TROPONIN I: 0.26 ng/mL — AB (ref <0.03)
TROPONIN I: 0.24 ng/mL — AB (ref <0.03)
Troponin I: 0.05 ng/mL (ref <0.03)
Troponin I: 0.28 ng/mL (ref <0.03)

## 2017-09-19 LAB — GLUCOSE, CAPILLARY
GLUCOSE-CAPILLARY: 153 mg/dL — AB (ref 70–99)
GLUCOSE-CAPILLARY: 155 mg/dL — AB (ref 70–99)
Glucose-Capillary: 171 mg/dL — ABNORMAL HIGH (ref 70–99)

## 2017-09-19 LAB — ECHOCARDIOGRAM COMPLETE
HEIGHTINCHES: 68 in
WEIGHTICAEL: 3020.8 [oz_av]

## 2017-09-19 LAB — HEPARIN LEVEL (UNFRACTIONATED): Heparin Unfractionated: 0.65 IU/mL (ref 0.30–0.70)

## 2017-09-19 LAB — PROTIME-INR
INR: 1.19
PROTHROMBIN TIME: 15 s (ref 11.4–15.2)

## 2017-09-19 LAB — BRAIN NATRIURETIC PEPTIDE: B Natriuretic Peptide: 313 pg/mL — ABNORMAL HIGH (ref 0.0–100.0)

## 2017-09-19 LAB — APTT: aPTT: 30 seconds (ref 24–36)

## 2017-09-19 MED ORDER — FUROSEMIDE 10 MG/ML IJ SOLN
40.0000 mg | Freq: Once | INTRAMUSCULAR | Status: AC
  Administered 2017-09-19: 40 mg via INTRAVENOUS
  Filled 2017-09-19: qty 4

## 2017-09-19 MED ORDER — FUROSEMIDE 10 MG/ML IJ SOLN
60.0000 mg | Freq: Two times a day (BID) | INTRAMUSCULAR | Status: DC
  Administered 2017-09-19: 60 mg via INTRAVENOUS
  Filled 2017-09-19: qty 6

## 2017-09-19 MED ORDER — SODIUM CHLORIDE 0.9 % IV SOLN: 250.0000 mL | INTRAVENOUS | Status: DC | PRN

## 2017-09-19 MED ORDER — LINACLOTIDE 290 MCG PO CAPS
290.0000 ug | ORAL_CAPSULE | Freq: Every day | ORAL | Status: DC
  Administered 2017-09-20 – 2017-09-22 (×3): 290 ug via ORAL
  Filled 2017-09-19 (×3): qty 1

## 2017-09-19 MED ORDER — GABAPENTIN 100 MG PO CAPS
200.0000 mg | ORAL_CAPSULE | Freq: Every day | ORAL | Status: DC
  Administered 2017-09-19 – 2017-09-22 (×4): 200 mg via ORAL
  Filled 2017-09-19 (×4): qty 2

## 2017-09-19 MED ORDER — ONDANSETRON HCL 4 MG/2ML IJ SOLN: 4.0000 mg | Freq: Four times a day (QID) | INTRAMUSCULAR | Status: DC | PRN

## 2017-09-19 MED ORDER — SODIUM CHLORIDE 0.9% FLUSH: 3.0000 mL | INTRAVENOUS | Status: DC | PRN

## 2017-09-19 MED ORDER — INSULIN ASPART 100 UNIT/ML ~~LOC~~ SOLN
0.0000 [IU] | Freq: Every day | SUBCUTANEOUS | Status: DC
  Administered 2017-09-20: 2 [IU] via SUBCUTANEOUS
  Filled 2017-09-19: qty 1

## 2017-09-19 MED ORDER — SENNOSIDES-DOCUSATE SODIUM 8.6-50 MG PO TABS: 1.0000 | ORAL_TABLET | Freq: Every evening | ORAL | Status: DC | PRN

## 2017-09-19 MED ORDER — HEPARIN BOLUS VIA INFUSION
4000.0000 [IU] | Freq: Once | INTRAVENOUS | Status: AC
  Administered 2017-09-19: 4000 [IU] via INTRAVENOUS
  Filled 2017-09-19: qty 4000

## 2017-09-19 MED ORDER — CARVEDILOL 25 MG PO TABS
25.0000 mg | ORAL_TABLET | Freq: Two times a day (BID) | ORAL | Status: DC
  Administered 2017-09-19 – 2017-09-20 (×2): 25 mg via ORAL
  Filled 2017-09-19 (×2): qty 1

## 2017-09-19 MED ORDER — GI COCKTAIL ~~LOC~~
30.0000 mL | Freq: Once | ORAL | Status: AC
  Administered 2017-09-19: 30 mL via ORAL
  Filled 2017-09-19: qty 30

## 2017-09-19 MED ORDER — ACETAMINOPHEN 325 MG PO TABS
650.0000 mg | ORAL_TABLET | Freq: Four times a day (QID) | ORAL | Status: DC | PRN
  Administered 2017-09-19: 650 mg via ORAL
  Filled 2017-09-19: qty 2

## 2017-09-19 MED ORDER — HEPARIN (PORCINE) IN NACL 100-0.45 UNIT/ML-% IJ SOLN
1000.0000 [IU]/h | INTRAMUSCULAR | Status: DC
  Administered 2017-09-19: 1100 [IU]/h via INTRAVENOUS
  Administered 2017-09-20: 1000 [IU]/h via INTRAVENOUS
  Filled 2017-09-19 (×2): qty 250

## 2017-09-19 MED ORDER — INSULIN ASPART 100 UNIT/ML ~~LOC~~ SOLN
0.0000 [IU] | Freq: Three times a day (TID) | SUBCUTANEOUS | Status: DC
  Administered 2017-09-19 – 2017-09-21 (×7): 3 [IU] via SUBCUTANEOUS
  Filled 2017-09-19 (×7): qty 1

## 2017-09-19 MED ORDER — ONDANSETRON HCL 4 MG PO TABS: 4.0000 mg | ORAL_TABLET | Freq: Four times a day (QID) | ORAL | Status: DC | PRN

## 2017-09-19 MED ORDER — HEPARIN SODIUM (PORCINE) 5000 UNIT/ML IJ SOLN: 5000.0000 [IU] | Freq: Three times a day (TID) | INTRAMUSCULAR | Status: DC

## 2017-09-19 MED ORDER — ALBUTEROL SULFATE HFA 108 (90 BASE) MCG/ACT IN AERS: 2.0000 | INHALATION_SPRAY | RESPIRATORY_TRACT | Status: DC | PRN

## 2017-09-19 MED ORDER — ASPIRIN EC 81 MG PO TBEC
81.0000 mg | DELAYED_RELEASE_TABLET | Freq: Every day | ORAL | Status: DC
  Administered 2017-09-19 – 2017-09-22 (×4): 81 mg via ORAL
  Filled 2017-09-19 (×4): qty 1

## 2017-09-19 MED ORDER — ACETAMINOPHEN 650 MG RE SUPP: 650.0000 mg | Freq: Four times a day (QID) | RECTAL | Status: DC | PRN

## 2017-09-19 MED ORDER — ALBUTEROL SULFATE (2.5 MG/3ML) 0.083% IN NEBU
2.5000 mg | INHALATION_SOLUTION | RESPIRATORY_TRACT | Status: DC | PRN
  Administered 2017-09-21: 2.5 mg via RESPIRATORY_TRACT
  Filled 2017-09-19: qty 3

## 2017-09-19 MED ORDER — SODIUM CHLORIDE 0.9% FLUSH
3.0000 mL | Freq: Two times a day (BID) | INTRAVENOUS | Status: DC
  Administered 2017-09-19 – 2017-09-22 (×4): 3 mL via INTRAVENOUS

## 2017-09-19 NOTE — ED Notes (Signed)
RT at bedside. Pt taken of bipap and placed on 2L nasal canula

## 2017-09-19 NOTE — Consult Note (Signed)
Cardiology Consult    Patient ID: Jesse Dickson MRN: [id number], DOB/AGE: 06/13/1940   Admit date: 09/19/2017 Date of Consult: 09/19/2017  Primary Physician: Cletis Athens, MD Primary Cardiologist: Dr. Saunders Revel - new Requesting Provider: Dr. Dahlia Client  Patient Profile    Jesse Dickson is a 77 y.o. male with a history of chronic diastolic CHF (JO>87% TTE 10/6765) / HFpEF, HTN, hypercholesteremia, DVT, DM on insulin, CKD III, anemia of chronic disease, prostate CA, MGUS, and chronic lower back pain, GERD with esophagitis, who is being seen today for the evaluation of worsening SOB and LE edema at the request of Dr. Dahlia Client.    Past Medical History   Past Medical History:  Diagnosis Date  . Chronic diastolic CHF (congestive heart failure) ([place])    a. TTE 05/2015 EF > 55%  . Chronic kidney disease (CKD), stage III (moderate) ([facility])   . DVT (deep venous thrombosis) ([place])   . GERD (gastroesophageal reflux disease)   . Hypercholesteremia   . Hypertension   . Insulin dependent diabetes mellitus ([place])   . MGUS (monoclonal gammopathy of unknown significance)   . Prostate CA ([facility]) 2015   only surgery per pt    Past Surgical History:  Procedure Laterality Date  . PROSTATECTOMY       Allergies  No Known Allergies  History of Present Illness    06/28/2017 [facility] admission also d/t SOB and LE edema and mildly elevated troponin. He experienced syncope during admission and workup showed PR and PP prolongation; Myoview with EF 60-65%; mild LA dilation/MR/ incr. PASP. Discharged with Lasix decrease to 20mg  BID, ASA 81mg  and ordered 06/30/17-07/31/17 heart monitor, showing 2nd degree AV block type 1, pause lasting 3 seconds, 6 beat SVT. Dr. Caryl Comes visit 6/24 with recommendation for abdominal binder and 3 month f/u, OP sleep study, and possibly mestinon.  09/19/17 (today), patient presented at ED with worsening SOB and LE edema, worsening x 2 days. Associated sx included reported AM chills. Pt denied  difficulty lying flat, dizziness, or light headedness. He described ongoing central/midline, dull chest pain (at worst 6/10), non-radiating and intermittent but no current CP. He denied n/v/d. He reported medication & 6 lb weight increase despite taking home Lasix.  In the ED (7/16), Troponin 0.05 (  0.28). BNP 313. Vitals - BP 143/102, HR 113, RR 37-24, T- 99.67F. O2Sat in high 80s and put on BiPAP and Lasix 40mg  IV x1  transitioned to [place] 2L with improving SOB. Glucose 240, BUN 24, Scr 1.68 (4/19 Scr 2.02), RBC 3.06, Hgb 9.2 (4/19 Hgb 8.2), Hct 27.8. EKG 109 bpm, sinus tachycardia, LAD, TWI I, avl. CXR with small b/l plueural effusions unchanged from recent imaging.   Of note, further cardiac evaluation with catheterization reasonable but pending input from nephrology given CKD III.  Inpatient Medications    . aspirin EC  81 mg Oral Daily  . carvedilol  25 mg Oral BID  . furosemide  60 mg Intravenous Q12H  . gabapentin  200 mg Oral Daily  . insulin aspart  0-15 Units Subcutaneous TID WC  . insulin aspart  0-5 Units Subcutaneous QHS  . [START ON 09/20/2017] linaclotide  290 mcg Oral QAC breakfast  . sodium chloride flush  3 mL Intravenous Q12H    Family History    Family History  Problem Relation Age of Onset  . Diabetes Mother   . COPD Mother   . Cancer Sister   . Cancer Sister   . Dementia Sister  indicated that his mother is deceased. He indicated that his father is deceased. He indicated that only one of his three sisters is alive. He indicated that all of his four brothers are deceased.   Social History    Social History   Socioeconomic History  . Marital status: Married    Spouse name: Not on file  . Number of children: Not on file  . Years of education: Not on file  . Highest education level: Not on file  Occupational History  . Not on file  Social Needs  . Financial resource strain: Not on file  . Food insecurity:    Worry: Not on file    Inability: Not on file    . Transportation needs:    Medical: Not on file    Non-medical: Not on file  Tobacco Use  . Smoking status: Never Smoker  . Smokeless tobacco: Never Used  Substance and Sexual Activity  . Alcohol use: No    Frequency: Never  . Drug use: No  . Sexual activity: Never  Lifestyle  . Physical activity:    Days per week: Not on file    Minutes per session: Not on file  . Stress: Not on file  Relationships  . Social connections:    Talks on phone: Not on file    Gets together: Not on file    Attends religious service: Not on file    Active member of club or organization: Not on file    Attends meetings of clubs or organizations: Not on file    Relationship status: Not on file  . Intimate partner violence:    Fear of current or ex partner: Not on file    Emotionally abused: Not on file    Physically abused: Not on file    Forced sexual activity: Not on file  Other Topics Concern  . Not on file  Social History Narrative  . Not on file     Review of Systems    General:  +++ AM chills. No fever, night sweats. +++Weight changes (6 lb increase).  Cardiovascular:  No active chest pain, +++dyspnea on exertion, +++edema, no orthopnea. No palpitations. no paroxysmal nocturnal dyspnea. Dermatological: No rash, lesions/masses. +++Lymphedema Respiratory: No cough, +++SOB Urologic: No hematuria, dysuria Abdominal:   No nausea, vomiting, diarrhea, bright red blood per rectum, melena, or hematemesis Neurologic:  No visual changes, +++weakness, no changes in mental status. All other systems reviewed and are otherwise negative except as noted above.  Physical Exam    Blood pressure (!) 102/58, pulse 91, temperature (!) 100.4 F (38 C), temperature source Oral, resp. rate 20, height 5\' 8"  (1.727 m), weight 188 lb 12.8 oz (85.6 kg), SpO2 100 %.  General: Pleasant, no acute distress but noticeably fatigued Psych: Normal affect. +++Somnolence noted throughout exam Neuro: Oriented X 3. Moves  all extremities spontaneously. +++General weakness. +++ trivial asterixis on exam. HEENT: Normal  Neck: Supple. +++Left bruit noted on exam. +++Mild JVD. Lungs:  +++On [place] 2L. Resp regular and unlabored but diminished breath sounds Heart: RRR no s3, s4. No murmur heard on exam. Abdomen: Non-tender. +++Firm, obese. BS + x 4.  Extremities: No clubbing, cyanosis.+++improving LE edema. DP/PT/Radials 2+ and equal bilaterally.  Labs    Troponin (Point of Care Test) No results for input(s): TROPIPOC in the last 72 hours. Recent Labs    09/19/17 0612 09/19/17 1112  TROPONINI 0.05* 0.28*   Lab Results  Component Value Date   WBC 10.4  09/19/2017   HGB 9.2 (L) 09/19/2017   HCT 27.8 (L) 09/19/2017   MCV 91.1 09/19/2017   PLT 200 09/19/2017    Recent Labs  Lab 09/19/17 0612  NA 142  K 3.7  CL 106  CO2 24  BUN 24*  CREATININE 1.68*  CALCIUM 9.3  GLUCOSE 240*   Lab Results  Component Value Date   CHOL 79 06/30/2017   HDL 27 (L) 06/30/2017   LDLCALC 43 06/30/2017   TRIG 46 06/30/2017   No results found for: [facility]   Radiology Studies    Dg Chest Portable 1 View  Result Date: 09/19/2017 CLINICAL DATA:  77 year old male with shortness of breath. EXAM: PORTABLE CHEST 1 VIEW COMPARISON:  Chest radiograph dated 06/28/2017 FINDINGS: There is shallow inspiration with bibasilar atelectasis and probable small bilateral pleural effusions. Overall similar or decrease in the pleural effusions compared to prior radiograph. Stable cardiac silhouette. No pneumothorax. No acute osseous pathology. IMPRESSION: Small bilateral pleural effusions, similar or decreased since the prior radiograph. Associated bibasilar atelectasis versus infiltrate. Electronically Signed   By: Anner Crete M.D.   On: 09/19/2017 06:53    ECG & Cardiac Imaging    TTE 06/28/17 Study Conclusions  - Left ventricle: The cavity size was normal. Systolic function was   normal. The estimated ejection fraction was in  the range of 60%   to 65%. Wall motion was normal; there were no regional wall   motion abnormalities. Left ventricular diastolic function   parameters were normal. - Mitral valve: There was mild to moderate regurgitation. - Left atrium: The atrium was mildly dilated. - Right ventricle: Systolic function was normal. - Pulmonary arteries: Systolic pressure was mildly elevated. PA   peak pressure: 36 mm Hg (S).  US Carotid B/L Impression 1. Mild (1-49%) stenosis proximal left internal carotid artery secondary to focal smooth heterogeneous atherosclerotic plaque. 2. No significant atherosclerotic plaque or evidence of stenosis in the right internal carotid artery. 3. Vertebral arteries are patent with normal antegrade flow.  Heart monitor 06/30/17-07/31/17 Sinus rhythm, avg HR 78bpm  1 run SVT - 6 beats; max HR 110 bpm.  1 pause lasting 3 seconds at noon. Unclear if associated with sleep. 2nd degree AV block type 1 noted. Intermittent BBB  09/19/17 TTE  Pending results  09/19/17 EKG Active orders -unable to view in record. Of note, today's telemetry significant for sinus pauses and 2nd degree AV block type 1   Assessment & Plan    1. Acute on chronic diastolic congestive heart failure (HFpEF) - No active CP.  - Troponin 0.05  0.28. Continue to cycle troponin.  - Acute exacerbation likely in setting of chronic renal disease and CHF. - 06/2017 echo EF 60-65%, mild - moderate MR, mild LA dilation / LVH, and mildly elevated PASP as above. - 7/16 echo results - pending - Unable to view ED EKG. Telemetry significant for sinus pauses and wenckebach - Continue home ASA 81mg  po qd, Coreg 25mg  po bid. - Continue Lasix 60mg  q12h.  - Daily weights. - Cardiac catheterization pending renal function / input from nephrology prior to contrast admission.  - Home medication = Lasix twice weekly. Consider increasing this dose at discharge if renal function allows and any further cardiac evaluation  as still seems to be holding onto fluid at exam. LE edema with  h/o lymphedema - diuresis with caution given renal disease.  2. Respiratory Distress  - In setting of volume overload, complicated by CKD III - BiPaP  in ED  [place] 2L with improvement in SOB. - CXR as above with b/l pleural effusions, consistent with previous imaging. WBC 10.4. - Continue diuresis with Lasix. Monitor renal function- consider increasing home Lasix dosage if renal function allows. - Consider outpatient sleep study as OSA suspected contributor to respiratory distress.  3. Wenckebach / 2nd degree AV block Type I with h/o syncope and weakness  - H/o syncope during April 2019 admission. C/o ongoing weakness / fatigue. - Per Dr. Olin Pia 6/4 notes, h/o syncopal events likely associated with hyper vagotonia and PR and PP prolongation and sinus pause. Per heart monitor evaluation, pt with 2nd degree AV block type I. Pt with abdominal binder (not currently wearing). - Telemetry significant for sinus pauses and wenckebach as above. - Asterxisis noted on PE. Patient denies drug and alcohol use.  - Check orthostatics and exercise caution with ambulation. Continue to monitor vitals. - Multifactorial including chronic disease states and possible OSA / disordered breathing.  4. Essential HTN - BP 143/102 in ED. BP currently stable at 117/60 following lasix and coreg. - Likely worsened with volume overload and in setting of respiratory distress and CHF exacerbation as above - Continue Coreg 25mg  po bid and lasix. Monitor for weakness given h/o falls.   5. Anemia of chronic disease in setting of CKD III - Hgb 9.2 and at baseline compared with April admission  - Monitor kidney function with diuresis. Scr 1.68 and at baseline. - MGUS as sequelae  - Avoid nephrotoxins and appreciate input from nephrology before contrast administration   6. CKD III - As per above. - Recommend nephrology input prior to cardiac catheterization & with  ongoing diuresis.  7. DM with diabetic neuropathy -Continue insulin - Per IM  8. Remaining per IM - Recommend outpatient follow-up given chronic conditions and history   For questions or updates, please contact [facility] Please consult [email or web address] for contact info under Cardiology/STEMI.      Dorthula Nettles, PA-C  Pager [phone] 09/19/2017, 4:00 PM

## 2017-09-19 NOTE — Progress Notes (Signed)
*  PRELIMINARY RESULTS* Echocardiogram 2D Echocardiogram has been performed.  Jesse Dickson 09/19/2017, 2:50 PM

## 2017-09-19 NOTE — Progress Notes (Signed)
Patient admitted to the floor from ED today, patient alert and oriented, no respiratory distress noted at this time , family at bedside, patient oriented to the room and call light .

## 2017-09-19 NOTE — Progress Notes (Signed)
Pharmacy Consult for heparin drip Indication: chest pain/ACS  No Known Allergies  Patient Measurements: Height: 5\' 8"  (172.7 cm) Weight: 188 lb 12.8 oz (85.6 kg) IBW/kg (Calculated) : 68.4  Vital Signs: Temp: 98.3 F (36.8 C) (07/16 1916) Temp Source: Oral (07/16 1705) BP: 99/53 (07/16 1916) Pulse Rate: 74 (07/16 1916)  Labs: Recent Labs    09/19/17 0612 09/19/17 1112 09/19/17 1331 09/19/17 1815 09/19/17 2209  HGB 9.2*  --   --   --   --   HCT 27.8*  --   --   --   --   PLT 200  --   --   --   --   APTT  --   --  30  --   --   LABPROT  --   --  15.0  --   --   INR  --   --  1.19  --   --   HEPARINUNFRC  --   --   --   --  0.65  CREATININE 1.68*  --   --   --   --   TROPONINI 0.05* 0.28*  --  0.26* 0.24*    Estimated Creatinine Clearance: 41.1 mL/min (A) (by C-G formula based on SCr of 1.68 mg/dL (H)).   Medical History: Past Medical History:  Diagnosis Date  . Chronic diastolic CHF (congestive heart failure) ([place])    a. TTE 05/2015 EF > 55%  . Chronic kidney disease (CKD), stage III (moderate) ([facility])   . DVT (deep venous thrombosis) ([place])   . GERD (gastroesophageal reflux disease)   . Hypercholesteremia   . Hypertension   . Insulin dependent diabetes mellitus ([place])   . MGUS (monoclonal gammopathy of unknown significance)   . Prostate CA ([place]) 2015   only surgery per pt    Medications:  Scheduled:  . aspirin EC  81 mg Oral Daily  . carvedilol  25 mg Oral BID  . furosemide  60 mg Intravenous Q12H  . gabapentin  200 mg Oral Daily  . insulin aspart  0-15 Units Subcutaneous TID WC  . insulin aspart  0-5 Units Subcutaneous QHS  . [START ON 09/20/2017] linaclotide  290 mcg Oral QAC breakfast  . sodium chloride flush  3 mL Intravenous Q12H    Assessment: Jesse Dickson  is a 77 y.o. male with a known history of chronic diastolic heart failure, chronic kidney disease stage III, GERD, hyperlipidemia, hypertension presented to the emergency room for increased  shortness of breath for the last few days. A first troponin level returned 0.05ng/mL, then a subsequent level was drawn indicating 0.28 ng/mL and Dr Estanislado Pandy started the heparin drip. He is on no anticoagulants PTA.  Goal of Therapy:  Heparin level 0.3-0.7 units/ml Monitor platelets by anticoagulation protocol: Yes   Plan:  07/16 @ 2200 HL 0.65 therapeutic. Will continue current rate and will recheck next anti-Xa w/ am labs.  Tobie Lords, PharmD, BCPS Clinical Pharmacist 09/19/2017

## 2017-09-19 NOTE — ED Notes (Signed)
RT at bedside to apply Bipap, pt alert , labored respirations

## 2017-09-19 NOTE — ED Triage Notes (Signed)
Pt arrives to ED via POV from home with c/o [facility] x2 days. Pt's s/o reports increasing weakness; states h/x of CHF. Pt's wife reports pt gets extremely SHOB with minimal exertion. No reports of N/V/D or fever.

## 2017-09-19 NOTE — ED Notes (Signed)
Report given to [facility]

## 2017-09-19 NOTE — Progress Notes (Signed)
Trop 0.28. Md notified, order received

## 2017-09-19 NOTE — Progress Notes (Signed)
Advanced care plan. Purpose of the Encounter: CODE STATUS Parties in Attendance:Patient Patient's Decision Capacity:Good Subjective/Patient's story: Presented to ER with shortness of breath Objective/Medical story Patient has CHF exacerbation Currently put on BiPAP for respiratory distress Goals of care determination:  Advance care directives and goals of care discussed Patient and family want everything done for now which includes CPR, intubation and ventilator if the need arises  CODE STATUS: Full code Time spent discussing advanced care planning: 16 minutes

## 2017-09-19 NOTE — ED Provider Notes (Signed)
[facility] Emergency Department Provider Note   ____________________________________________   First MD Initiated Contact with Patient 09/19/17 [phone]     (approximate)  I have reviewed the triage vital signs and the nursing notes.   HISTORY  Chief Complaint Shortness of Breath  History obtained from patient's wife  HPI Jesse Dickson is a 77 y.o. male who comes into the hospital today because he cannot breathe.  The patient's wife also states that he had pain in his back.  The symptoms started 2 days ago.  He was short of breath this morning and he was also stating that he was very cold and he was shivering.  The patient has a history of CHF and kidney problems.  He had swelling in his legs and some worsening shortness of breath whenever he tries to walk.  He has been taking his medications regularly.  He denies any chest pain, dizziness, lightheadedness.  The patient's wife brought him into the hospital for further evaluation of his symptoms.   Past Medical History:  Diagnosis Date  . Chronic diastolic CHF (congestive heart failure) ([place])    a. TTE 05/2015 EF > 55%  . Chronic kidney disease (CKD), stage III (moderate) (HCC)   . DVT (deep venous thrombosis) ([place])   . GERD (gastroesophageal reflux disease)   . Hypercholesteremia   . Hypertension   . Insulin dependent diabetes mellitus ([place])   . MGUS (monoclonal gammopathy of unknown significance)   . Prostate CA ([place]) 2015   only surgery per pt    Patient Active Problem List   Diagnosis Date Noted  . HTN (hypertension) 07/07/2017  . Lymphedema 07/07/2017  . Chest pain 06/28/2017  . Chronic diastolic congestive heart failure ([place]) 09/13/2016  . Chronic low back pain 09/13/2016  . GERD without esophagitis 09/13/2016  . Insulin dependent diabetes mellitus ([place]) 09/13/2016  . Pure hypercholesterolemia 09/13/2016  . Anemia of chronic disease 05/18/2015  . CKD (chronic kidney disease) stage 3, GFR  30-59 ml/min (HCC) 05/18/2015    Past Surgical History:  Procedure Laterality Date  . PROSTATECTOMY      Prior to Admission medications   Medication Sig Start Date End Date Taking? Authorizing Provider  aspirin EC 81 MG tablet Take 81 mg by mouth daily.    Yes [provider]  atorvastatin (LIPITOR) 20 MG tablet Take 20 mg by mouth daily.   Yes [provider]  carvedilol (COREG) 25 MG tablet Take 25 mg by mouth 2 (two) times daily.   Yes [provider]  furosemide (LASIX) 20 MG tablet Take 1 tablet (20 mg total) by mouth 2 (two) times a week. On Monday and thursdays only 07/03/17  Yes Fritzi Mandes, MD  gabapentin (NEURONTIN) 100 MG capsule Take 200 mg by mouth daily. 06/20/16  Yes [provider]  linaclotide Rolan Lipa) 290 MCG CAPS capsule Take 1 capsule (290 mcg total) by mouth daily before breakfast. 03/14/17 09/20/18 Yes Jonathon Bellows, MD  TRESIBA FLEXTOUCH 100 UNIT/ML SOPN FlexTouch Pen INJECT 30UNITS WITH AUTO-INJECTOR ONCE DAILY if CBG > 150 mg/dl 02/14/17  Yes [provider]  VENTOLIN HFA 108 (90 Base) MCG/ACT inhaler INHALE 1 PUFF BY MOUTH 3 TIMES A DAY AS NEEDED 02/14/17  Yes [provider]  Insulin Syringe-Needle U-100 (INSULIN SYRINGE 1CC/30GX5/16") 30G X 5/16" 1 ML MISC Use 1 Dose 4 (four) times daily. 09/10/15   [provider]  Roma Schanz test strip CHECK DAILY 01/21/17   [provider]  Allergies Patient has no known allergies.  Family History  Problem Relation Age of Onset  . Diabetes Mother   . COPD Mother   . Cancer Sister   . Cancer Sister   . Dementia Sister     Social History Social History   Tobacco Use  . Smoking status: Never Smoker  . Smokeless tobacco: Never Used  Substance Use Topics  . Alcohol use: No    Frequency: Never  . Drug use: No    Review of Systems  Constitutional: chills Eyes: No visual changes. ENT: No sore throat. Cardiovascular: Denies chest  pain. Respiratory:  shortness of breath. Gastrointestinal: No abdominal pain.  No nausea, no vomiting.  No diarrhea.  No constipation. Genitourinary: Negative for dysuria. Musculoskeletal: Negative for back pain. Skin: Negative for rash. Neurological: Negative for headaches, focal weakness or numbness. Lymph: Lower extremity edema bilaterally  ____________________________________________   PHYSICAL EXAM:  VITAL SIGNS: ED Triage Vitals  Enc Vitals Group     BP 09/19/17 0555 (!) 143/102     Pulse Rate 09/19/17 0555 (!) 113     Resp 09/19/17 0555 (!) 24     Temp 09/19/17 0555 99.9 F (37.7 C)     Temp Source 09/19/17 0555 Oral     SpO2 09/19/17 0555 99 %     Weight 09/19/17 0557 190 lb (86.2 kg)     Height 09/19/17 0557 5\' 8"  (1.727 m)     Head Circumference --      Peak Flow --      Pain Score 09/19/17 0556 9     Pain Loc --      Pain Edu? --      Excl. in [place]? --     Constitutional: Alert and oriented. Well appearing and in moderate respiratory distress. Eyes: Conjunctivae are normal. PERRL. EOMI. Head: Atraumatic. Nose: No congestion/rhinnorhea. Mouth/Throat: Mucous membranes are moist.  Oropharynx non-erythematous. Cardiovascular: Normal rate, regular rhythm. Grossly normal heart sounds.  Good peripheral circulation. Respiratory: Increased respiratory effort.  No retractions. diminished breath sounds lin all lung fields Gastrointestinal: Soft and nontender. No distention. Positive bowel sounds Musculoskeletal: Bilateral lower extremity edema Neurologic:  Normal speech and language.  Skin:  Skin is warm, dry and intact.  Psychiatric: Mood and affect are normal.   ____________________________________________   LABS (all labs ordered are listed, but only abnormal results are displayed)  Labs Reviewed  BASIC METABOLIC PANEL - Abnormal; Notable for the following components:      Result Value   Glucose, Bld 240 (*)    BUN 24 (*)    Creatinine, Ser 1.68 (*)    GFR  calc non Af Amer 38 (*)    GFR calc Af Amer 45 (*)    All other components within normal limits  CBC - Abnormal; Notable for the following components:   RBC 3.06 (*)    Hemoglobin 9.2 (*)    HCT 27.8 (*)    All other components within normal limits  TROPONIN I - Abnormal; Notable for the following components:   Troponin I 0.05 (*)    All other components within normal limits  BRAIN NATRIURETIC PEPTIDE - Abnormal; Notable for the following components:   B Natriuretic Peptide 313.0 (*)    All other components within normal limits   ____________________________________________  EKG  ED ECG REPORT I, Loney Hering, the attending physician, personally viewed and interpreted this ECG.   Date: 09/19/2017  EKG Time: 607  Rate: 109  Rhythm:  sinus tachycardia  Axis: left axis deviation  Intervals:none  ST&T Change: flipped t waves in I, avL  ____________________________________________  RADIOLOGY  ED MD interpretation:  CXR: Small bilateral pleural effusions, similar or decreased in size since the prior radiograph, associated bibasilar atelectasis versus infiltrate.  Official radiology report(s): Dg Chest Portable 1 View  Result Date: 09/19/2017 CLINICAL DATA:  77 year old male with shortness of breath. EXAM: PORTABLE CHEST 1 VIEW COMPARISON:  Chest radiograph dated 06/28/2017 FINDINGS: There is shallow inspiration with bibasilar atelectasis and probable small bilateral pleural effusions. Overall similar or decrease in the pleural effusions compared to prior radiograph. Stable cardiac silhouette. No pneumothorax. No acute osseous pathology. IMPRESSION: Small bilateral pleural effusions, similar or decreased since the prior radiograph. Associated bibasilar atelectasis versus infiltrate. Electronically Signed   By: Anner Crete M.D.   On: 09/19/2017 06:53    ____________________________________________   PROCEDURES  Procedure(s) performed: please, see procedure  note(s).  .Critical Care Performed by: Loney Hering, MD Authorized by: Loney Hering, MD   Critical care provider statement:    Critical care time (minutes):  30   Critical care start time:  09/19/2017 6:20 AM   Critical care end time:  09/19/2017 6:50 AM   Critical care time was exclusive of:  Separately billable procedures and treating other patients   Critical care was necessary to treat or prevent imminent or life-threatening deterioration of the following conditions:  Respiratory failure   Critical care was time spent personally by me on the following activities:  Development of treatment plan with patient or surrogate, evaluation of patient's response to treatment, examination of patient, obtaining history from patient or surrogate, ordering and performing treatments and interventions, ordering and review of laboratory studies, ordering and review of radiographic studies, pulse oximetry, re-evaluation of patient's condition and review of old charts   I assumed direction of critical care for this patient from another provider in my specialty: no      Critical Care performed: Yes, see critical care note(s)  ____________________________________________   INITIAL IMPRESSION / ASSESSMENT AND PLAN / ED COURSE  As part of my medical decision making, I reviewed the following data within the electronic MEDICAL RECORD NUMBER Notes from prior ED visits and [place] Controlled Substance Database   This is a 77 year old male who comes into the hospital today with some shortness of breath.  The patient is to have a history of congestive heart failure.  He was admitted last in the hospital in April with the same thing.  The patient's wife states that he has been having more shortness of breath when he walks as well as when he tries to lay flat.  He is also been having some swelling in his legs.  He came in tonight because he was having some significant shortness of breath.  According to triage when  they tried to ambulate him his oxygen saturation was in the high 80s.  When the patient arrived he was breathing with a respiratory rate of 37.  He was also tachycardic.  I did start him on BiPAP.  I also give the patient a dose of Lasix 40 mg IV x1.  The patient CBC is unremarkable and his glucose is 240 but his BMP is unremarkable.  His troponin is 0.05 and his BNP is 313.  As the patient is on BiPAP with respiratory distress he will be admitted to the hospitalist service.  Patient will be reassessed and admitted.      ____________________________________________  FINAL CLINICAL IMPRESSION(S) / ED DIAGNOSES  Final diagnoses:  Shortness of breath  Acute on chronic congestive heart failure, unspecified heart failure type [facility])     ED Discharge Orders    None       Note:  This document was prepared using Dragon voice recognition software and may include unintentional dictation errors.    Loney Hering, MD 09/19/17 [phone]

## 2017-09-19 NOTE — Progress Notes (Signed)
MD notified about heart rate 39 on the monitor, BD 83/53, received prder to hold lasix and cored dose for tonight , will recheck BP and heart rate .

## 2017-09-19 NOTE — ED Notes (Signed)
ED Provider at bedside. 

## 2017-09-19 NOTE — ED Notes (Signed)
Spoke with admitting MD about trial off bipap.

## 2017-09-19 NOTE — H&P (Signed)
[place] at [place] NAME: Jesse Dickson    MR#:  [id number]  DATE OF BIRTH:  [birth year]  DATE OF ADMISSION:  09/19/2017  PRIMARY CARE PHYSICIAN: Cletis Athens, MD   REQUESTING/REFERRING PHYSICIAN:   CHIEF COMPLAINT:   Chief Complaint  Patient presents with  . Shortness of Breath    HISTORY OF PRESENT ILLNESS: Jesse Dickson  is a 77 y.o. male with a known history of chronic diastolic heart failure, chronic kidney disease stage III, GERD, hyperlipidemia, hypertension presented to the emergency room for increased shortness of breath for the last few days.  Patient was in respiratory distress in the emergency BiPAP and stabilized initially.  BNP was elevated patient received IV Lasix for diuresis.  Patient felt much better and less short of breath and he was weaned of BiPAP in the emergency room.  Hospitalist service was consulted.  No complaints of any chest pain.  No fever and chills.  PAST MEDICAL HISTORY:   Past Medical History:  Diagnosis Date  . Chronic diastolic CHF (congestive heart failure) ([place])    a. TTE 05/2015 EF > 55%  . Chronic kidney disease (CKD), stage III (moderate) (HCC)   . DVT (deep venous thrombosis) ([place])   . GERD (gastroesophageal reflux disease)   . Hypercholesteremia   . Hypertension   . Insulin dependent diabetes mellitus ([place])   . MGUS (monoclonal gammopathy of unknown significance)   . Prostate CA ([place]) 2015   only surgery per pt    PAST SURGICAL HISTORY:  Past Surgical History:  Procedure Laterality Date  . PROSTATECTOMY      SOCIAL HISTORY:  Social History   Tobacco Use  . Smoking status: Never Smoker  . Smokeless tobacco: Never Used  Substance Use Topics  . Alcohol use: No    Frequency: Never    FAMILY HISTORY:  Family History  Problem Relation Age of Onset  . Diabetes Mother   . COPD Mother   . Cancer Sister   . Cancer Sister   . Dementia Sister     DRUG ALLERGIES:  No Known Allergies  REVIEW OF SYSTEMS:   CONSTITUTIONAL: No fever, fatigue or weakness.  EYES: No blurred or double vision.  EARS, NOSE, AND THROAT: No tinnitus or ear pain.  RESPIRATORY: No cough,has shortness of breath,  No wheezing or hemoptysis.  CARDIOVASCULAR: No chest pain,  Has orthopnea, edema.  GASTROINTESTINAL: No nausea, vomiting, diarrhea or abdominal pain.  GENITOURINARY: No dysuria, hematuria.  ENDOCRINE: No polyuria, nocturia,  HEMATOLOGY: No anemia, easy bruising or bleeding SKIN: No rash or lesion. MUSCULOSKELETAL: No joint pain or arthritis.   NEUROLOGIC: No tingling, numbness, weakness.  PSYCHIATRY: No anxiety or depression.   MEDICATIONS AT HOME:  Prior to Admission medications   Medication Sig Start Date End Date Taking? Authorizing Provider  aspirin EC 81 MG tablet Take 81 mg by mouth daily.    Yes [provider]  atorvastatin (LIPITOR) 20 MG tablet Take 20 mg by mouth daily.   Yes [provider]  carvedilol (COREG) 25 MG tablet Take 25 mg by mouth 2 (two) times daily.   Yes [provider]  furosemide (LASIX) 20 MG tablet Take 1 tablet (20 mg total) by mouth 2 (two) times a week. On Monday and thursdays only 07/03/17  Yes Fritzi Mandes, MD  gabapentin (NEURONTIN) 100 MG capsule Take 200 mg by mouth daily. 06/20/16  Yes [provider]  linaclotide Rolan Lipa) [place]  capsule Take 1 capsule (290 mcg total) by mouth daily before breakfast. 03/14/17 09/20/18 Yes Jonathon Bellows, MD  TRESIBA FLEXTOUCH 100 UNIT/ML SOPN FlexTouch Pen INJECT 30UNITS WITH AUTO-INJECTOR ONCE DAILY if CBG > 150 mg/dl 02/14/17  Yes [provider]  VENTOLIN HFA 108 (90 Base) MCG/ACT inhaler INHALE 1 PUFF BY MOUTH 3 TIMES A DAY AS NEEDED 02/14/17  Yes [provider]  Insulin Syringe-Needle U-100 (INSULIN SYRINGE 1CC/30GX5/16") 30G X 5/16" 1 ML MISC Use 1 Dose 4 (four) times daily. 09/10/15   [provider]  Roma Schanz test strip  CHECK DAILY 01/21/17   [provider]      PHYSICAL EXAMINATION:   VITAL SIGNS: Blood pressure 107/63, pulse 95, temperature 99.9 F (37.7 C), temperature source Oral, resp. rate 16, height 5\' 8"  (1.727 m), weight 86.2 kg (190 lb), SpO2 96 %.  GENERAL:  77 y.o.-year-old patient lying in the bed with no acute distress.  EYES: Pupils equal, round, reactive to light and accommodation. No scleral icterus. Extraocular muscles intact.  HEENT: Head atraumatic, normocephalic. Oropharynx and nasopharynx clear.  NECK:  Supple, no jugular venous distention. No thyroid enlargement, no tenderness.  LUNGS: Decreased breath sounds bilaterally, bibasilar crepitations heard. No use of accessory muscles of respiration.  CARDIOVASCULAR: S1, S2 normal. No murmurs, rubs, or gallops.  ABDOMEN: Soft, nontender, nondistended. Bowel sounds present. No organomegaly or mass.  EXTREMITIES: Has pedal edema,  No cyanosis, or clubbing.  NEUROLOGIC: Cranial nerves II through XII are intact. Muscle strength 5/5 in all extremities. Sensation intact. Gait not checked.  PSYCHIATRIC: The patient is alert and oriented x 3.  SKIN: No obvious rash, lesion, or ulcer.   LABORATORY PANEL:   CBC Recent Labs  Lab 09/19/17 0612  WBC 10.4  HGB 9.2*  HCT 27.8*  PLT 200  MCV 91.1  MCH 30.0  MCHC 33.0  RDW 13.5   ------------------------------------------------------------------------------------------------------------------  Chemistries  Recent Labs  Lab 09/19/17 0612  NA 142  K 3.7  CL 106  CO2 24  GLUCOSE 240*  BUN 24*  CREATININE 1.68*  CALCIUM 9.3   ------------------------------------------------------------------------------------------------------------------ estimated creatinine clearance is 41.2 mL/min (A) (by C-G formula based on SCr of 1.68 mg/dL (H)). ------------------------------------------------------------------------------------------------------------------ No results for  input(s): TSH, T4TOTAL, T3FREE, THYROIDAB in the last 72 hours.  Invalid input(s): FREET3   Coagulation profile No results for input(s): INR, PROTIME in the last 168 hours. ------------------------------------------------------------------------------------------------------------------- No results for input(s): DDIMER in the last 72 hours. -------------------------------------------------------------------------------------------------------------------  Cardiac Enzymes Recent Labs  Lab 09/19/17 0612  TROPONINI 0.05*   ------------------------------------------------------------------------------------------------------------------ Invalid input(s): POCBNP  ---------------------------------------------------------------------------------------------------------------  Urinalysis    Component Value Date/Time   COLORURINE YELLOW (A) 08/06/2016 1259   APPEARANCEUR HAZY (A) 08/06/2016 1259   LABSPEC 1.016 08/06/2016 1259   PHURINE 5.0 08/06/2016 1259   GLUCOSEU NEGATIVE 08/06/2016 1259   HGBUR MODERATE (A) 08/06/2016 1259   BILIRUBINUR NEGATIVE 08/06/2016 1259   KETONESUR NEGATIVE 08/06/2016 1259   PROTEINUR 100 (A) 08/06/2016 1259   NITRITE NEGATIVE 08/06/2016 1259   LEUKOCYTESUR NEGATIVE 08/06/2016 1259     RADIOLOGY: Dg Chest Portable 1 View  Result Date: 09/19/2017 CLINICAL DATA:  77 year old male with shortness of breath. EXAM: PORTABLE CHEST 1 VIEW COMPARISON:  Chest radiograph dated 06/28/2017 FINDINGS: There is shallow inspiration with bibasilar atelectasis and probable small bilateral pleural effusions. Overall similar or decrease in the pleural effusions compared to prior radiograph. Stable cardiac silhouette. No pneumothorax. No acute osseous pathology. IMPRESSION: Small bilateral pleural effusions, similar or decreased  since the prior radiograph. Associated bibasilar atelectasis versus infiltrate. Electronically Signed   By: Anner Crete M.D.   On: 09/19/2017  06:53    EKG: Orders placed or performed during the hospital encounter of 09/19/17  . ED EKG within 10 minutes  . ED EKG within 10 minutes    IMPRESSION AND PLAN:  76 year old male patient with history of chronic diastolic heart failure, chronic kidney disease stage III, GERD, hypertension, hyperlipidemia, diabetes mellitus presented to the emergency room for increased shortness of breath.  -Acute on chronic diastolic heart failure exacerbation Admit patient to telemetry IV Lasix 60 MDQ 12 hourly for diuresis Check echocardiogram and cardiology consultation  -Elevated troponin secondary to demand ischemia  cycle troponin  -Chronic kidney disease stage III Monitor renal function  -DVT prophylaxis subcu heparin   All the records are reviewed and case discussed with ED provider. Management plans discussed with the patient, family and they are in agreement.  CODE STATUS:Full code Code Status History    Date Active Date Inactive Code Status Order ID Comments User Context   06/28/2017 0416 06/30/2017 1830 Full Code [id number]  Harrie Foreman, MD Inpatient       TOTAL TIME TAKING CARE OF THIS PATIENT: 52 minutes.    Saundra Shelling M.D on 09/19/2017 at 10:42 AM  Between 7am to 6pm - Pager - [phone]  After 6pm go to [email or web address] - password [email or web address] [place] [facility]  Office  [phone]  CC: Primary care physician; Cletis Athens, MD

## 2017-09-19 NOTE — Progress Notes (Signed)
   Pharmacy Consult for heparin drip Indication: chest pain/ACS  No Known Allergies  Patient Measurements: Height: 5\' 8"  (172.7 cm) Weight: 188 lb 12.8 oz (85.6 kg) IBW/kg (Calculated) : 68.4  Vital Signs: Temp: 100.4 F (38 C) (07/16 1049) Temp Source: Oral (07/16 1049) BP: 102/58 (07/16 1049) Pulse Rate: 91 (07/16 1049)  Labs: Recent Labs    09/19/17 0612 09/19/17 1112  HGB 9.2*  --   HCT 27.8*  --   PLT 200  --   CREATININE 1.68*  --   TROPONINI 0.05* 0.28*    Estimated Creatinine Clearance: 41.1 mL/min (A) (by C-G formula based on SCr of 1.68 mg/dL (H)).   Medical History: Past Medical History:  Diagnosis Date  . Chronic diastolic CHF (congestive heart failure) ([place])    a. TTE 05/2015 EF > 55%  . Chronic kidney disease (CKD), stage III (moderate) (HCC)   . DVT (deep venous thrombosis) ([place])   . GERD (gastroesophageal reflux disease)   . Hypercholesteremia   . Hypertension   . Insulin dependent diabetes mellitus ([place])   . MGUS (monoclonal gammopathy of unknown significance)   . Prostate CA ([place]) 2015   only surgery per pt    Medications:  Scheduled:  . aspirin EC  81 mg Oral Daily  . carvedilol  25 mg Oral BID  . furosemide  60 mg Intravenous Q12H  . gabapentin  200 mg Oral Daily  . heparin  4,000 Units Intravenous Once  . insulin aspart  0-15 Units Subcutaneous TID WC  . insulin aspart  0-5 Units Subcutaneous QHS  . [START ON 09/20/2017] linaclotide  290 mcg Oral QAC breakfast  . sodium chloride flush  3 mL Intravenous Q12H    Assessment: Jesse Dickson  is a 77 y.o. male with a known history of chronic diastolic heart failure, chronic kidney disease stage III, GERD, hyperlipidemia, hypertension presented to the emergency room for increased shortness of breath for the last few days. A first troponin level returned 0.05ng/mL, then a subsequent level was drawn indicating 0.28 ng/mL and Dr Estanislado Pandy started the heparin drip. He is on no anticoagulants  PTA.  Goal of Therapy:  Heparin level 0.3-0.7 units/ml Monitor platelets by anticoagulation protocol: Yes   Plan:  Give 4000 units bolus x 1 Start heparin infusion at 1100 units/hr Check anti-Xa level in 8 hours and daily while on heparin Continue to monitor H&H and platelets  Dallie Piles, PharmD 09/19/2017,1:23 PM

## 2017-09-20 ENCOUNTER — Other Ambulatory Visit: Payer: Self-pay

## 2017-09-20 DIAGNOSIS — R0602 Shortness of breath: Secondary | ICD-10-CM

## 2017-09-20 DIAGNOSIS — D631 Anemia in chronic kidney disease: Secondary | ICD-10-CM

## 2017-09-20 LAB — CBC
HEMATOCRIT: 26.4 % — AB (ref 40.0–52.0)
HEMOGLOBIN: 8.6 g/dL — AB (ref 13.0–18.0)
MCH: 29.6 pg (ref 26.0–34.0)
MCHC: 32.8 g/dL (ref 32.0–36.0)
MCV: 90.4 fL (ref 80.0–100.0)
Platelets: 176 10*3/uL (ref 150–440)
RBC: 2.92 MIL/uL — AB (ref 4.40–5.90)
RDW: 13.8 % (ref 11.5–14.5)
WBC: 10.7 10*3/uL — AB (ref 3.8–10.6)

## 2017-09-20 LAB — BASIC METABOLIC PANEL
ANION GAP: 5 (ref 5–15)
BUN: 35 mg/dL — AB (ref 8–23)
CO2: 26 mmol/L (ref 22–32)
Calcium: 8.7 mg/dL — ABNORMAL LOW (ref 8.9–10.3)
Chloride: 106 mmol/L (ref 98–111)
Creatinine, Ser: 2.36 mg/dL — ABNORMAL HIGH (ref 0.61–1.24)
GFR, EST AFRICAN AMERICAN: 30 mL/min — AB (ref >60)
GFR, EST NON AFRICAN AMERICAN: 26 mL/min — AB (ref >60)
Glucose, Bld: 159 mg/dL — ABNORMAL HIGH (ref 70–99)
POTASSIUM: 4.5 mmol/L (ref 3.5–5.1)
SODIUM: 137 mmol/L (ref 135–145)

## 2017-09-20 LAB — GLUCOSE, CAPILLARY
GLUCOSE-CAPILLARY: 154 mg/dL — AB (ref 70–99)
GLUCOSE-CAPILLARY: 246 mg/dL — AB (ref 70–99)
Glucose-Capillary: 113 mg/dL — ABNORMAL HIGH (ref 70–99)
Glucose-Capillary: 178 mg/dL — ABNORMAL HIGH (ref 70–99)

## 2017-09-20 LAB — HEPARIN LEVEL (UNFRACTIONATED): Heparin Unfractionated: 0.71 IU/mL — ABNORMAL HIGH (ref 0.30–0.70)

## 2017-09-20 MED ORDER — CARVEDILOL 3.125 MG PO TABS
3.1250 mg | ORAL_TABLET | Freq: Two times a day (BID) | ORAL | Status: DC
  Administered 2017-09-20 – 2017-09-22 (×4): 3.125 mg via ORAL
  Filled 2017-09-20 (×4): qty 1

## 2017-09-20 NOTE — Progress Notes (Addendum)
[facility] NAME: Jesse Dickson    MR#:  [id number]  DATE OF BIRTH:  1940-09-05  SUBJECTIVE:  CHIEF COMPLAINT:   Chief Complaint  Patient presents with  . Shortness of Breath  Patient seen today Has decreased shortness of breath No chest pain Off oxygen  REVIEW OF SYSTEMS:    ROS  CONSTITUTIONAL: No documented fever. No fatigue, weakness. No weight gain, no weight loss.  EYES: No blurry or double vision.  ENT: No tinnitus. No postnasal drip. No redness of the oropharynx.  RESPIRATORY: No cough, no wheeze, no hemoptysis. Decreased dyspnea.  CARDIOVASCULAR: No chest pain. No orthopnea. No palpitations. No syncope.  GASTROINTESTINAL: No nausea, no vomiting or diarrhea. No abdominal pain. No melena or hematochezia.  GENITOURINARY: No dysuria or hematuria.  ENDOCRINE: No polyuria or nocturia. No heat or cold intolerance.  HEMATOLOGY: No anemia. No bruising. No bleeding.  INTEGUMENTARY: No rashes. No lesions.  MUSCULOSKELETAL: No arthritis. No swelling. No gout.  NEUROLOGIC: No numbness, tingling, or ataxia. No seizure-type activity.  PSYCHIATRIC: No anxiety. No insomnia. No ADD.   DRUG ALLERGIES:  No Known Allergies  VITALS:  Blood pressure 121/68, pulse 70, temperature 98.1 F (36.7 C), temperature source Oral, resp. rate 18, height 5\' 8"  (1.727 m), weight 86.3 kg (190 lb 4.1 oz), SpO2 91 %.  PHYSICAL EXAMINATION:   Physical Exam  GENERAL:  77 y.o.-year-old patient lying in the bed with no acute distress.  EYES: Pupils equal, round, reactive to light and accommodation. No scleral icterus. Extraocular muscles intact.  HEENT: Head atraumatic, normocephalic. Oropharynx and nasopharynx clear.  NECK:  Supple, no jugular venous distention. No thyroid enlargement, no tenderness.  LUNGS: Decreased breath sounds bilaterally, decreased bibasilar rales. No use of accessory muscles of respiration.  CARDIOVASCULAR: S1, S2  normal. No murmurs, rubs, or gallops.  ABDOMEN: Soft, nontender, nondistended. Bowel sounds present. No organomegaly or mass.  EXTREMITIES: No cyanosis, clubbing  decreased edema b/l.    NEUROLOGIC: Cranial nerves II through XII are intact. No focal Motor or sensory deficits b/l.   PSYCHIATRIC: The patient is alert and oriented x 3.  SKIN: No obvious rash, lesion, or ulcer.   LABORATORY PANEL:   CBC Recent Labs  Lab 09/20/17 0456  WBC 10.7*  HGB 8.6*  HCT 26.4*  PLT 176   ------------------------------------------------------------------------------------------------------------------ Chemistries  Recent Labs  Lab 09/20/17 0456  NA 137  K 4.5  CL 106  CO2 26  GLUCOSE 159*  BUN 35*  CREATININE 2.36*  CALCIUM 8.7*   ------------------------------------------------------------------------------------------------------------------  Cardiac Enzymes Recent Labs  Lab 09/19/17 2209  TROPONINI 0.24*   ------------------------------------------------------------------------------------------------------------------  RADIOLOGY:  Dg Chest Portable 1 View  Result Date: 09/19/2017 CLINICAL DATA:  77 year old male with shortness of breath. EXAM: PORTABLE CHEST 1 VIEW COMPARISON:  Chest radiograph dated 06/28/2017 FINDINGS: There is shallow inspiration with bibasilar atelectasis and probable small bilateral pleural effusions. Overall similar or decrease in the pleural effusions compared to prior radiograph. Stable cardiac silhouette. No pneumothorax. No acute osseous pathology. IMPRESSION: Small bilateral pleural effusions, similar or decreased since the prior radiograph. Associated bibasilar atelectasis versus infiltrate. Electronically Signed   By: Anner Crete M.D.   On: 09/19/2017 06:53     ASSESSMENT AND PLAN:  77 year old male patient with history of chronic diastolic heart failure, chronic kidney disease stage III, GERD, hypertension, hyperlipidemia, diabetes mellitus  under hospitalist service for increased shortness of breath.  -Acute on chronic diastolic heart failure exacerbation improving  Off oxygen Off lasix secondary to worsening renal failure f/u echocardiogram  Cardiology f/u  -Elevated troponin secondary to demand ischemia  Versus ischemia cycle troponin On heparin drip for anticoagulation Continue aspirin and betablocker  -Acute Kidney injury on Chronic kidney disease stage III Probably secondary to diuretics Monitor renal function Lasix stopped  -DVT prophylaxis On anticoagulation with heparin drip  All the records are reviewed and case discussed with Care Management/Social Worker. Management plans discussed with the patient, family and they are in agreement.  CODE STATUS: Full code  DVT Prophylaxis: SCDs  TOTAL TIME TAKING CARE OF THIS PATIENT: 35 minutes.   POSSIBLE D/C IN 2 to 3 DAYS, DEPENDING ON CLINICAL CONDITION.  Saundra Shelling M.D on 09/20/2017 at 11:26 AM  Between 7am to 6pm - Pager - [phone]  After 6pm go to [email or web address] - password [email or web address] [facility]  Office  [phone]  CC: Primary care physician; Cletis Athens, MD  Note: This dictation was prepared with Dragon dictation along with smaller phrase technology. Any transcriptional errors that result from this process are unintentional.

## 2017-09-20 NOTE — Progress Notes (Addendum)
Progress Note  Patient Name: Jesse Dickson Date of Encounter: 09/20/2017  Primary Cardiologist: Dr. Lavera Guise   Subjective   Patient reports he feels much better this morning with improving SOB. He also reports improved lower back pain though feels the pain may worsen with ambulation. He slept well overnight and ate breakfast and is comfortably resting in bed and watching television. CP free.  Of note, patient now off  oxygen. He has not yet ambulated so it is recommended he ambulate before discharge. Overnight brady with hypotension so BB and lasix held with stabilization of vitals this morning. Telemetry today significant for Wenckebach and 1 sinus pause noted, HR 65-68. Vitals stable with T 98.1, HR 70, RR18, BP 121/68, SpO2 91. Also of note, patient renal function shows bump in Scr and BUN. Lasix stopped this morning. Will stop heparin as pt without further CP. Also will reduce BB dosage to be continued at home and touch base with nephrology regarding home dose of lasix.  Touched base with nephrology today and recommend a formal nephrology consult tomorrow.  Inpatient Medications    Scheduled Meds: . aspirin EC  81 mg Oral Daily  . carvedilol  25 mg Oral BID  . furosemide  60 mg Intravenous Q12H  . gabapentin  200 mg Oral Daily  . insulin aspart  0-15 Units Subcutaneous TID WC  . insulin aspart  0-5 Units Subcutaneous QHS  . linaclotide  290 mcg Oral QAC breakfast  . sodium chloride flush  3 mL Intravenous Q12H   Continuous Infusions: . sodium chloride    . heparin 1,000 Units/hr (09/20/17 1029)   PRN Meds: sodium chloride, acetaminophen **OR** acetaminophen, albuterol, ondansetron **OR** ondansetron (ZOFRAN) IV, senna-docusate, sodium chloride flush   Vital Signs    Vitals:   09/19/17 1916 09/20/17 0337 09/20/17 0735 09/20/17 1028  BP: (!) 99/53 109/64 121/68   Pulse: 74 70 70   Resp: 18 18 18    Temp: 98.3 F (36.8 C) 98.1 F (36.7 C) 98.1 F (36.7 C)     TempSrc:   Oral   SpO2: 99% 93% 91%   Weight:    190 lb 4.1 oz (86.3 kg)  Height:        Intake/Output Summary (Last 24 hours) at 09/20/2017 1031 Last data filed at 09/20/2017 1021 Gross per 24 hour  Intake 480 ml  Output 350 ml  Net 130 ml   Filed Weights   09/19/17 0557 09/19/17 1049 09/20/17 1028  Weight: 190 lb (86.2 kg) 188 lb 12.8 oz (85.6 kg) 190 lb 4.1 oz (86.3 kg)    Telemetry    Wenckebach. HR mid-high 60s, 1 sinus pause noted - Personally Reviewed  ECG    Not performed today  Physical Exam   Physical Exam  Constitutional: He is oriented to person, place, and time. Vital signs are normal. He appears well-developed and well-nourished. He is cooperative. No distress.  HENT:  Head: Normocephalic and atraumatic.  Eyes: No scleral icterus.  Neck: Normal range of motion. Neck supple. JVD present.  Cardiovascular: Normal rate and intact distal pulses. Exam reveals no gallop and no friction rub.  No murmur heard. Pulses:      Carotid pulses are on the left side with bruit. Pulmonary/Chest: Effort normal. No accessory muscle usage. No respiratory distress. He has decreased breath sounds in the right lower field and the left lower field. He has no wheezes. He has no rhonchi. He has no rales. He exhibits no tenderness.  Abdominal: Bowel  sounds are normal. He exhibits no mass. There is no tenderness. There is no rebound and no guarding.  Musculoskeletal: Normal range of motion. He exhibits edema. He exhibits no tenderness or deformity.  2+ b/l LE pitting edema  Neurological: He is alert and oriented to person, place, and time.  Skin: Skin is warm and dry. No rash noted. He is not diaphoretic. No cyanosis or erythema. No pallor. Nails show no clubbing.  Psychiatric: He has a normal mood and affect. His speech is normal and behavior is normal. Judgment and thought content normal.  Vitals reviewed.   Labs    Chemistry Recent Labs  Lab 09/19/17 0612 09/20/17 0456  NA  142 137  K 3.7 4.5  CL 106 106  CO2 24 26  GLUCOSE 240* 159*  BUN 24* 35*  CREATININE 1.68* 2.36*  CALCIUM 9.3 8.7*  GFRNONAA 38* 26*  GFRAA 45* 30*  ANIONGAP 12 5     Hematology Recent Labs  Lab 09/19/17 0612 09/20/17 0456  WBC 10.4 10.7*  RBC 3.06* 2.92*  HGB 9.2* 8.6*  HCT 27.8* 26.4*  MCV 91.1 90.4  MCH 30.0 29.6  MCHC 33.0 32.8  RDW 13.5 13.8  PLT 200 176    Cardiac Enzymes Recent Labs  Lab 09/19/17 0612 09/19/17 1112 09/19/17 1815 09/19/17 2209  TROPONINI 0.05* 0.28* 0.26* 0.24*   No results for input(s): TROPIPOC in the last 168 hours.   BNP Recent Labs  Lab 09/19/17 0612  BNP 313.0*     DDimer No results for input(s): DDIMER in the last 168 hours.   Radiology    Dg Chest Portable 1 View  Result Date: 09/19/2017 CLINICAL DATA:  77 year old male with shortness of breath. EXAM: PORTABLE CHEST 1 VIEW COMPARISON:  Chest radiograph dated 06/28/2017 FINDINGS: There is shallow inspiration with bibasilar atelectasis and probable small bilateral pleural effusions. Overall similar or decrease in the pleural effusions compared to prior radiograph. Stable cardiac silhouette. No pneumothorax. No acute osseous pathology. IMPRESSION: Small bilateral pleural effusions, similar or decreased since the prior radiograph. Associated bibasilar atelectasis versus infiltrate. Electronically Signed   By: Anner Crete M.D.   On: 09/19/2017 06:53    Cardiac Studies   06/30/2017 US Carotid B/l 1. Mild (1-49%) stenosis proximal left internal carotid artery secondary to focal smooth heterogeneous atherosclerotic plaque. 2. No significant atherosclerotic plaque or evidence of stenosis in the right internal carotid artery. 3. Vertebral arteries are patent with normal antegrade flow.  06/30/2017 TTE - Left ventricle: The cavity size was normal. Systolic function was   normal. The estimated ejection fraction was in the range of 60%   to 65%. Wall motion was normal; there  were no regional wall   motion abnormalities. Left ventricular diastolic function   parameters were normal. - Mitral valve: There was mild to moderate regurgitation. - Left atrium: The atrium was mildly dilated. - Right ventricle: Systolic function was normal. - Pulmonary arteries: Systolic pressure was mildly elevated. PA   peak pressure: 36 mm Hg (S).  06/30/17-07/31/17 Heart Monitor Sinus rhythm, avg HR 78bpm  1 run SVT - 6 beats; max HR 110 bpm.  1 pause lasting 3 seconds at noon. Unclear if associated with sleep. 2nd degree AV block type 1 noted. Intermittent BBB  09/19/17 TTE EF 55-60%. Mild to moderate LVH. Pseudo-normal LV filling consistent with G2DD, elevated LV filling pressure. AV clacified annulus, Mild MR, Mild LA dilation, Mildly reduced LVSF, mildly elevated PASP 35-63mmHg, IVC consistent with elevated CVP, L  pleural effusion.  Patient Profile     77 y.o. male with h/o chronic HFpEF, HTN, HLD, DVT, DM, CKD3, anemia of chronic disease, MGUS, prostate CA, & with h/o intermittent CP for at least two years and recent admission 06/2017 with syncope that presented to [facility] ED 09/19/17 with worsening SOB and LE edema and consultation requested by Dr. Dahlia Client.  Assessment & Plan    1. Acute on chronic diastolic congestive heart failure (HFpEF) with demand ischemia - No active CP.  - Troponin 0.05  0.28  0.24. BNP 313. - Acute exacerbation likely in setting of chronic renal disease and CHF. - 7/16 TTE as above- EF 55-60%; ? from 06/2017 at 60-65%. - 7/16 EKG - SR, 1st degree A-V block, LAD, and evidence of prior infarct. Left bundle branch block from 06/28/17 EKG no longer present. - 7/16-7/17 telemetry significant for sinus pauses and Wenckebach - Per RN, at 4:20PM 7/16: HR 39bpm and 83/53. Held lasix and Coreg 7/16 PM dose and vitals stable this morning. - Stopped Lasix 60mg  q12h with bump in Scr. - Stopped Heparin given bump in Scr. - Ambulate patient & check BP / O2Sat before  discharge. - Nephro - will touch base with again regarding home Lasix dosing before discharge.  - Continue to check daily weights. - Continue home ASA 81mg  po qd at discharge  - Reduce home BB dose / change home dose at discharge from Coreg 25mg  po bid  Coreg 3.125mg  po BID. Decrease d/t soft pressures during admission. Pt reports he does not ambulate well at home and uses walking cane. - No current plan to proceed with cardiac catheterization this admission given no current CP / SOB. Will need to consult nephro if need cardiac cath in future given renal function.   2. Wenckebach / 2nd degree AV block Type I with h/o syncope and weakness  - H/o syncope on April 2019 admission. C/o ongoing weakness / fatigue. - Per Dr. Caryl Comes 08/08/17, h/o syncopal events likely 2/2 hyper vagotonia and PR and PP prolongation. Also sinus pause. Heart monitor evaluation shows Wenckebach. Pt treated with abdominal binder (not currently wearing). - Telemetry significant for sinus pauses and Wenckebach this admission. Continue to monitor. - Check orthostatics before discharge.  3. Respiratory Distress  - In setting of volume overload, complicated by CKD III - CXR as above with b/l pleural effusions, consistent with previous imaging. WBC 10.4. - Pt now breathing fine ORA. Previously on [place] 2L, BiPAP. - Consider outpatient sleep study -OSA suspected contributor to respiratory distress.  4. CKD III - Scr 1.68 (pt baseline)   2.36 with diuresis. BUN 24  35 since admission.  - Lasix and Heparin stopped this morning.  - Per nephro, will continue to hold lasix if SCr high tomorrow. - Per nephro, will not restart home Losartan 12.5mg  at discharge given renal status.    5. Anemia of chronic disease in setting of CKD III - RBC 3.06  2.92 Hgb 9.2 (pt baseline)  8.6; HCT 27.8  26.4 since admission.  - Will touch base with nephro before discharge as above.  6. Essential HTN - BP soft overnight. Stable now after holding  Lasix and Coreg, as documented above.  - Continue to hold lasix tomorrow if renal function continues to worsen. - Recommend frequent vital checks. BP checks at home. - Change Coreg 25mg  po bid  Coreg 3.125 po BID to be continued after discharge.  - Consider pt ambulation at home with diuresis and BB. Pt  does not ambulate well at baseline. Uses a walking cane.  7. DM with diabetic neuropathy on insulin - Glucose 240  159 since admission. - Recommend follow-up with PCP.  8. Remaining per IM - Recommend outpatient follow-up with PCP given chronic conditions and history.   For questions or updates, please contact [place] Please consult [email or web address] for contact info under Cardiology/STEMI.      Dorthula Nettles, PA-C  Pager [phone] 09/20/2017, 10:31 AM     Attending Note Patient seen and examined, agree with detailed note above,  Patient presentation and plan discussed on rounds.   EKG lab work, chest x-ray, echocardiogram reviewed independently by myself  Patient is a difficult historian  shortness of breath is improved, still in the bed [place] with a cane secondary to chronic back pain Sedentary at home Hypotension overnight, beta blocker and diuretics held  Lab work reviewed creatinine up to 2.36 BUN 35 sodium 137 potassium 4.5 troponin 0.24 non-trending 3 hematocrit 26  On exam he is supine comfortable in bed no distress, no significant JVD lungs clear with dullness at the bases poor respiratory effort heart sounds regular normal S1-S2 no murmurs appreciated abdomen soft nontender, trace pretibial edema  A/P:  1. Acute on chronic diastolic congestive heart failure (HFpEF)  --suspect narrow therapeutic window given climbing creatinine with minimal diuresis  BNP 313.  climb in renal function Concerning for prerenal, also in the setting of hypotension Lasix held - nephrology to evaluate -Currently on Coreg 25 twice a day, previously on 3.125 mg  twice a day. We'll decrease dose - No current plan to proceed with cardiac catheterization given on trending troponin and abnormal renal function, recent Myoview low risk  2. History of syncope and weakness Very deconditioned at baseline Sedentary most of the day walks with a walker at home, limited by chronic back pain --Would monitor orthostatics  h/o syncopal events likely 2/2 hyper vagotonia and PR and PP prolongation. Also sinus pause. Heart monitor evaluation shows Wenckebach. Pt treated with abdominal binder (not currently wearing).  3. CKD III - Scr 1.68 (pt baseline)   2.36 with diuresis. BUN 24  35 since admission.  - Lasix stopped this morning.  - Per nephro, will continue to hold lasix if SCr high tomorrow.  5. Anemia of chronic disease in setting of CKD III  HCT 27.8  26.4 since admission.  May need to consider Procrit Anemia workup  Previously with low normal iron studies  6. Essential HTN Would decrease carvedilol 3.25 twice a day Hold losartan given renal dysfunction, was previously on 12.5 daily  7. DM with diabetic neuropathy on insulin - Glucose 240  159 since admission. Primary care monitoring  Long discussion with patient and family to bedside Greater than 50% was spent in counseling and coordination of care with patient Total encounter time 35 minutes or more   Signed: Esmond Plants  M.D., Ph.D. [facility]

## 2017-09-20 NOTE — Progress Notes (Signed)
Pharmacy Consult for heparin drip Indication: chest pain/ACS  No Known Allergies  Patient Measurements: Height: 5\' 8"  (172.7 cm) Weight: 188 lb 12.8 oz (85.6 kg) IBW/kg (Calculated) : 68.4  Vital Signs: Temp: 98.1 F (36.7 C) (07/17 0337) BP: 109/64 (07/17 0337) Pulse Rate: 70 (07/17 0337)  Labs: Recent Labs    09/19/17 0612 09/19/17 1112 09/19/17 1331 09/19/17 1815 09/19/17 2209 09/20/17 0456  HGB 9.2*  --   --   --   --  8.6*  HCT 27.8*  --   --   --   --  26.4*  PLT 200  --   --   --   --  176  APTT  --   --  30  --   --   --   LABPROT  --   --  15.0  --   --   --   INR  --   --  1.19  --   --   --   HEPARINUNFRC  --   --   --   --  0.65 0.71*  CREATININE 1.68*  --   --   --   --  2.36*  TROPONINI 0.05* 0.28*  --  0.26* 0.24*  --     Estimated Creatinine Clearance: 29.2 mL/min (A) (by C-G formula based on SCr of 2.36 mg/dL (H)).   Medical History: Past Medical History:  Diagnosis Date  . Chronic diastolic CHF (congestive heart failure) ([place])    a. TTE 05/2015 EF > 55%  . Chronic kidney disease (CKD), stage III (moderate) ([facility])   . DVT (deep venous thrombosis) ([place])   . GERD (gastroesophageal reflux disease)   . Hypercholesteremia   . Hypertension   . Insulin dependent diabetes mellitus ([place])   . MGUS (monoclonal gammopathy of unknown significance)   . Prostate CA ([place]) 2015   only surgery per pt    Medications:  Scheduled:  . aspirin EC  81 mg Oral Daily  . carvedilol  25 mg Oral BID  . furosemide  60 mg Intravenous Q12H  . gabapentin  200 mg Oral Daily  . insulin aspart  0-15 Units Subcutaneous TID WC  . insulin aspart  0-5 Units Subcutaneous QHS  . linaclotide  290 mcg Oral QAC breakfast  . sodium chloride flush  3 mL Intravenous Q12H    Assessment: Jesse Dickson  is a 77 y.o. male with a known history of chronic diastolic heart failure, chronic kidney disease stage III, GERD, hyperlipidemia, hypertension presented to the emergency room for  increased shortness of breath for the last few days. A first troponin level returned 0.05ng/mL, then a subsequent level was drawn indicating 0.28 ng/mL and Dr Estanislado Pandy started the heparin drip. He is on no anticoagulants PTA.  Goal of Therapy:  Heparin level 0.3-0.7 units/ml Monitor platelets by anticoagulation protocol: Yes   Plan:  07/16 @ 2200 HL 0.65 therapeutic. Will continue current rate and will recheck next anti-Xa w/ am labs.  07/17 @ 0500 HL 0.71 slightly supratherapeutic. Will decrease rate to 1000 units/hr and will recheck next HL @ 1500. hgb trending down will continue to monitor.  Tobie Lords, PharmD, BCPS Clinical Pharmacist 09/20/2017

## 2017-09-21 DIAGNOSIS — I509 Heart failure, unspecified: Secondary | ICD-10-CM

## 2017-09-21 LAB — GLUCOSE, CAPILLARY
GLUCOSE-CAPILLARY: 143 mg/dL — AB (ref 70–99)
Glucose-Capillary: 151 mg/dL — ABNORMAL HIGH (ref 70–99)
Glucose-Capillary: 191 mg/dL — ABNORMAL HIGH (ref 70–99)
Glucose-Capillary: 175 mg/dL — ABNORMAL HIGH (ref 70–99)

## 2017-09-21 LAB — BASIC METABOLIC PANEL
Anion gap: 9 (ref 5–15)
BUN: 37 mg/dL — ABNORMAL HIGH (ref 8–23)
CHLORIDE: 106 mmol/L (ref 98–111)
CO2: 22 mmol/L (ref 22–32)
Calcium: 8.8 mg/dL — ABNORMAL LOW (ref 8.9–10.3)
Creatinine, Ser: 2.1 mg/dL — ABNORMAL HIGH (ref 0.61–1.24)
GFR calc Af Amer: 34 mL/min — ABNORMAL LOW (ref >60)
GFR calc non Af Amer: 29 mL/min — ABNORMAL LOW (ref >60)
GLUCOSE: 170 mg/dL — AB (ref 70–99)
POTASSIUM: 4.5 mmol/L (ref 3.5–5.1)
Sodium: 137 mmol/L (ref 135–145)

## 2017-09-21 MED ORDER — PREMIER PROTEIN SHAKE
11.0000 [oz_av] | Freq: Two times a day (BID) | ORAL | Status: DC
  Administered 2017-09-22: 11 [oz_av] via ORAL

## 2017-09-21 MED ORDER — FUROSEMIDE 20 MG PO TABS
20.0000 mg | ORAL_TABLET | Freq: Every day | ORAL | Status: DC
  Administered 2017-09-22: 20 mg via ORAL
  Filled 2017-09-21: qty 1

## 2017-09-21 MED ORDER — POTASSIUM CHLORIDE CRYS ER 20 MEQ PO TBCR: 40.0000 meq | EXTENDED_RELEASE_TABLET | Freq: Two times a day (BID) | ORAL | Status: DC

## 2017-09-21 MED ORDER — FUROSEMIDE 20 MG PO TABS
20.0000 mg | ORAL_TABLET | ORAL | Status: DC
  Administered 2017-09-21: 20 mg via ORAL
  Filled 2017-09-21: qty 1

## 2017-09-21 MED ORDER — FUROSEMIDE 40 MG PO TABS: 40.0000 mg | ORAL_TABLET | Freq: Two times a day (BID) | ORAL | Status: DC

## 2017-09-21 MED ORDER — HEPARIN SODIUM (PORCINE) 5000 UNIT/ML IJ SOLN
5000.0000 [IU] | Freq: Three times a day (TID) | INTRAMUSCULAR | Status: DC
  Administered 2017-09-21 – 2017-09-22 (×3): 5000 [IU] via SUBCUTANEOUS
  Filled 2017-09-21 (×3): qty 1

## 2017-09-21 NOTE — Plan of Care (Signed)
Nutrition Education Note  RD consulted for nutrition education regarding CHF.  77 year old male patient with history of chronic diastolic heart failure, chronic kidney disease stage III, GERD, hypertension, hyperlipidemia, diabetes mellitus under hospitalist service for increased shortness of breath.  Met with pt in room today. Pt reports good appetite and oral intake at baseline but reports he hasn't eaten well for the past few days. Pt reports he is feeling better today. Pt ate 95% of his lunch. Per chart, pt is weight stable.   RD provided "Low Sodium Nutrition Therapy" handout from the Academy of [employer]. Reviewed patient's dietary recall. Provided examples on ways to decrease sodium intake in diet. Discouraged intake of processed foods and use of salt shaker. Encouraged fresh fruits and vegetables as well as whole grain sources of carbohydrates to maximize fiber intake.   RD discussed why it is important for patient to adhere to diet recommendations, and emphasized the role of fluids, foods to avoid, and importance of weighing self daily. Teach back method used.  Expect fair compliance. Pt seemed very uninterested in education today.   Body mass index is 28.78 kg/m. Pt meets criteria for normal weight based on current BMI.  Current diet order is HH, patient is consuming approximately 95% of meals at this time. Labs and medications reviewed. No further nutrition interventions warranted at this time. RD contact information provided. If additional nutrition issues arise, please re-consult RD.   Koleen Distance MS, RD, LDN Pager #- [phone] Office#- [phone] After Hours Pager: [phone]

## 2017-09-21 NOTE — Evaluation (Signed)
Physical Therapy Evaluation Patient Details Name: Jesse Dickson MRN: [id number] DOB: [birth year] Today's Date: 09/21/2017   History of Present Illness  Pt is a 77 y.o. male presenting to hospital 09/19/17 with SOB, LE swelling, and LBP.  Pt admitted with acute on chronic diastolic heart failure exacerbation, elevated troponin d/t demand ischemia, and acute on chronic kidney disease stage III.  PMH includes prostate CA, CHF, CKD, DVT, DM, htn, MGUS, h/o syncopal episodes.  Clinical Impression  Prior to hospital admission, pt was modified independent with ambulation (used SPC shorter distances and walker longer distances).  Pt lives with his wife on main floor of home (level entry).  Currently pt is modified independent with bed mobility, modified independent with transfers, and CGA ambulating 70 feet and then another 70 feet with SPC.  Distance ambulating limited d/t SOB--pt requiring sitting rest break between ambulation trials (O2 sats 92-93% at rest beginning/end of session and O2 decreased down to 91% with activity; pt on room air throughout therapy session).  Pt would benefit from skilled PT to address noted impairments and functional limitations (see below for any additional details).  Upon hospital discharge, recommend pt discharge to home with HHPT and support of family.    Follow Up Recommendations Home health PT    Equipment Recommendations  Cane    Recommendations for Other Services       Precautions / Restrictions Precautions Precautions: Fall Restrictions Weight Bearing Restrictions: No      Mobility  Bed Mobility Overal bed mobility: Modified Independent             General bed mobility comments: Supine to sit with HOB elevated without any noted difficulties.  Transfers Overall transfer level: Modified independent Equipment used: Straight cane;None             General transfer comment: sit to/from stand and stand step turn with SPC steady and  safe  Ambulation/Gait Ambulation/Gait assistance: Min guard Gait Distance (Feet): (70 feet x2) Assistive device: Straight cane   Gait velocity: decreased   General Gait Details: partial step through gait pattern; sitting rest break between ambulation trials d/t SOB; mildly unsteady with SPC with increased distance ambulated but no loss of balance noted  Stairs            Wheelchair Mobility    Modified Rankin (Stroke Patients Only)       Balance Overall balance assessment: Needs assistance Sitting-balance support: No upper extremity supported Sitting balance-Leahy Scale: Normal Sitting balance - Comments: steady sitting reaching outside BOS   Standing balance support: No upper extremity supported Standing balance-Leahy Scale: Good Standing balance comment: steady standing reaching within BOS                             Pertinent Vitals/Pain Pain Assessment: 0-10 Pain Score: 4  Pain Location: chronic low back pain (worsens with ambulation) Pain Descriptors / Indicators: Sore;Aching Pain Intervention(s): Limited activity within patient's tolerance;Monitored during session;Repositioned  HR WFL during session.    Home Living Family/patient expects to be discharged to:: Private residence Living Arrangements: Spouse/significant other Available Help at Discharge: Family;Available 24 hours/day Type of Home: House Home Access: Level entry     Home Layout: Two level;Able to live on main level with bedroom/bathroom Home Equipment: Kasandra Knudsen - single point;Walker - 2 wheels      Prior Function Level of Independence: Independent with assistive device(s)         Comments: Pt uses  SPC for shorter distances but uses walker for longer distances.  No reported falls in past 6 months.     Hand Dominance        Extremity/Trunk Assessment   Upper Extremity Assessment Upper Extremity Assessment: Generalized weakness    Lower Extremity Assessment Lower  Extremity Assessment: Generalized weakness    Cervical / Trunk Assessment Cervical / Trunk Assessment: Normal  Communication   Communication: No difficulties(Heavy accent)  Cognition Arousal/Alertness: Awake/alert Behavior During Therapy: WFL for tasks assessed/performed Overall Cognitive Status: Within Functional Limits for tasks assessed                                        General Comments   Nursing cleared pt for participation in physical therapy.  Pt agreeable to PT session.    Exercises     Assessment/Plan    PT Assessment Patient needs continued PT services  PT Problem List Decreased strength;Decreased activity tolerance;Decreased balance;Decreased mobility;Cardiopulmonary status limiting activity;Pain       PT Treatment Interventions DME instruction;Gait training;Therapeutic activities;Therapeutic exercise;Balance training;Patient/family education    PT Goals (Current goals can be found in the Care Plan section)  Acute Rehab PT Goals Patient Stated Goal: to improve breathing PT Goal Formulation: With patient Time For Goal Achievement: 10/05/17 Potential to Achieve Goals: Fair    Frequency Min 2X/week   Barriers to discharge        Co-evaluation               AM-PAC PT "6 Clicks" Daily Activity  Outcome Measure Difficulty turning over in bed (including adjusting bedclothes, sheets and blankets)?: None Difficulty moving from lying on back to sitting on the side of the bed? : None Difficulty sitting down on and standing up from a chair with arms (e.g., wheelchair, bedside commode, etc,.)?: None Help needed moving to and from a bed to chair (including a wheelchair)?: None Help needed walking in hospital room?: A Little Help needed climbing 3-5 steps with a railing? : A Little 6 Click Score: 22    End of Session Equipment Utilized During Treatment: Gait belt Activity Tolerance: Other (comment)(SOB with increased distance  ambulated) Patient left: in chair;with call bell/phone within reach;with chair alarm set Nurse Communication: Mobility status;Precautions PT Visit Diagnosis: Unsteadiness on feet (R26.81);Other abnormalities of gait and mobility (R26.89);Muscle weakness (generalized) (M62.81)    Time: 6712-4580 PT Time Calculation (min) (ACUTE ONLY): 19 min   Charges:   PT Evaluation $PT Eval Low Complexity: 1 Low PT Treatments $Therapeutic Exercise: 8-22 mins   PT G CodesLeitha Bleak, PT 09/21/17, 4:15 PM [phone]

## 2017-09-21 NOTE — Progress Notes (Signed)
[facility]  ROUNDING NOTE   Subjective:   Mr. Jesse Dickson admitted to [facility] on 09/19/2017 for Shortness of breath [R06.02] Acute on chronic congestive heart failure, unspecified heart failure type ([place]) [I50.9] CHF (congestive heart failure) ([place]) [I50.9]  Nephrology consulted for increase in creatinine.   Patient was last seen by nephrology on 06/05/2017 by Dr. Holley Raring where creatinine was 2.1 with GFR of 35.   Wife is at bedside who assist with history taking.   Objective:  Vital signs in last 24 hours:  Temp:  [97.9 F (36.6 C)-98.4 F (36.9 C)] 98.4 F (36.9 C) (07/18 0739) Pulse Rate:  [64-69] 69 (07/18 0739) Resp:  [18-19] 18 (07/18 0739) BP: (96-130)/(57-66) 130/66 (07/18 0739) SpO2:  [93 %-98 %] 95 % (07/18 0739) Weight:  [85.9 kg (189 lb 4.8 oz)] 85.9 kg (189 lb 4.8 oz) (07/18 0332)  Weight change: 0.661 kg (1 lb 7.3 oz) Filed Weights   09/19/17 1049 09/20/17 1028 09/21/17 0332  Weight: 85.6 kg (188 lb 12.8 oz) 86.3 kg (190 lb 4.1 oz) 85.9 kg (189 lb 4.8 oz)    Intake/Output: I/O last 3 completed shifts: In: 450 [P.O.:360; I.V.:90] Out: 0    Intake/Output this shift:  Total I/O In: -  Out: 400 [Urine:400]  Physical Exam: General: NAD, laying in bed  Head: Normocephalic, atraumatic. Moist oral mucosal membranes  Eyes: Anicteric, PERRL  Neck: Supple, trachea midline  Lungs:  Clear to auscultation  Heart: Regular rate and rhythm  Abdomen:  Soft, nontender,   Extremities: 1+ peripheral edema.  Neurologic: Nonfocal, moving all four extremities  Skin: No lesions        Basic Metabolic Panel: Recent Labs  Lab 09/19/17 0612 09/20/17 0456 09/21/17 0550  NA 142 137 137  K 3.7 4.5 4.5  CL 106 106 106  CO2 24 26 22   GLUCOSE 240* 159* 170*  BUN 24* 35* 37*  CREATININE 1.68* 2.36* 2.10*  CALCIUM 9.3 8.7* 8.8*    Liver Function Tests: No results for input(s): AST, ALT, ALKPHOS, BILITOT, PROT, ALBUMIN in the last 168 hours. No  results for input(s): LIPASE, AMYLASE in the last 168 hours. No results for input(s): AMMONIA in the last 168 hours.  CBC: Recent Labs  Lab 09/19/17 0612 09/20/17 0456  WBC 10.4 10.7*  HGB 9.2* 8.6*  HCT 27.8* 26.4*  MCV 91.1 90.4  PLT 200 176    Cardiac Enzymes: Recent Labs  Lab 09/19/17 0612 09/19/17 1112 09/19/17 1815 09/19/17 2209  TROPONINI 0.05* 0.28* 0.26* 0.24*    BNP: Invalid input(s): POCBNP  CBG: Recent Labs  Lab 09/20/17 1124 09/20/17 1659 09/20/17 2133 09/21/17 0737 09/21/17 1117  GLUCAP 113* 178* 246* 151* 191*    Microbiology: Results for orders placed or performed during the hospital encounter of 08/06/16  Urine culture     Status: Abnormal   Collection Time: 08/06/16 12:59 PM  Result Value Ref Range Status   Specimen Description URINE, RANDOM  Final   Special Requests NONE  Final   Culture MULTIPLE SPECIES PRESENT, SUGGEST RECOLLECTION (A)  Final   Report Status 08/08/2016 FINAL  Final    Coagulation Studies: Recent Labs    09/19/17 1331  LABPROT 15.0  INR 1.19    Urinalysis: No results for input(s): COLORURINE, LABSPEC, PHURINE, GLUCOSEU, HGBUR, BILIRUBINUR, KETONESUR, PROTEINUR, UROBILINOGEN, NITRITE, LEUKOCYTESUR in the last 72 hours.  Invalid input(s): APPERANCEUR    Imaging: No results found.   Medications:   . sodium chloride     .  aspirin EC  81 mg Oral Daily  . carvedilol  3.125 mg Oral BID  . furosemide  20 mg Oral QODAY  . gabapentin  200 mg Oral Daily  . heparin injection (subcutaneous)  5,000 Units Subcutaneous Q8H  . insulin aspart  0-15 Units Subcutaneous TID WC  . insulin aspart  0-5 Units Subcutaneous QHS  . linaclotide  290 mcg Oral QAC breakfast  . protein supplement shake  11 oz Oral BID BM  . sodium chloride flush  3 mL Intravenous Q12H   sodium chloride, acetaminophen **OR** acetaminophen, albuterol, ondansetron **OR** ondansetron (ZOFRAN) IV, senna-docusate, sodium chloride flush  Assessment/  Plan:  Mr. Jesse Dickson is a 77 year old [place] male with past medical history of hyperlipidemia, diabetes mellitus type, chronic diastolic heart failure, GERD, prostate cancer, history of left lower extremity DVT admitted to [facility] on 09/19/2017 for acute exacerbation of diastolic congestive heart failure.   1. Acute renal failure on Chronic kidney disease stage III with proteinuria Chronic kidney disease secondary to diabetic nephropathy and hypertension Acute renal failure secondary to acute cardio renal syndrome - holding losartan  2. Hypertension with acute exacerbation of diastolic congestive heart failure - Resume PO furosemide. Home dose of 20mg  bid.   3. Anemia of chronic kidney disease: with history of light chain disease: hemoglobin of 8.6  - follows with Dr. Janese Banks, [place] therapy with wife.   4. Diabetes mellitus type II with chronic kidney disease: insulin dependent. Hemoglobin A1c of 6.9% in April.     LOS: 2 Jesse Dickson 7/18/20192:42 PM

## 2017-09-21 NOTE — Progress Notes (Addendum)
Progress Note  Patient Name: Jesse Dickson Date of Encounter: 09/21/2017  Primary Cardiologist: Nelva Bush, MD  Subjective   Breathing stable, no chest pain.  Edema improved. Hasn't been out of bed much.  Inpatient Medications    Scheduled Meds: . aspirin EC  81 mg Oral Daily  . carvedilol  3.125 mg Oral BID  . furosemide  20 mg Oral QODAY  . gabapentin  200 mg Oral Daily  . insulin aspart  0-15 Units Subcutaneous TID WC  . insulin aspart  0-5 Units Subcutaneous QHS  . linaclotide  290 mcg Oral QAC breakfast  . sodium chloride flush  3 mL Intravenous Q12H   Continuous Infusions: . sodium chloride     PRN Meds: sodium chloride, acetaminophen **OR** acetaminophen, albuterol, ondansetron **OR** ondansetron (ZOFRAN) IV, senna-docusate, sodium chloride flush   Vital Signs    Vitals:   09/20/17 1505 09/20/17 2046 09/21/17 0332 09/21/17 0739  BP: 105/61 115/64 111/63 130/66  Pulse: 67 66 69 69  Resp: 19 18 18 18   Temp:  97.9 F (36.6 C) 98 F (36.7 C) 98.4 F (36.9 C)  TempSrc:  Oral Oral Oral  SpO2: 98% 97% 94% 95%  Weight:   189 lb 4.8 oz (85.9 kg)   Height:        Intake/Output Summary (Last 24 hours) at 09/21/2017 1131 Last data filed at 09/21/2017 0900 Gross per 24 hour  Intake 90 ml  Output 400 ml  Net -310 ml   Filed Weights   09/19/17 1049 09/20/17 1028 09/21/17 0332  Weight: 188 lb 12.8 oz (85.6 kg) 190 lb 4.1 oz (86.3 kg) 189 lb 4.8 oz (85.9 kg)    Physical Exam   GEN: Well nourished, well developed, in no acute distress.  HEENT: Grossly normal.  Neck: Supple, no JVD, carotid bruits, or masses. Cardiac: RRR, no murmurs, rubs, or gallops. No clubbing, cyanosis, edema.  Radials/DP/PT 2+ and equal bilaterally.  Respiratory:  Respirations regular and unlabored, diminished breath sounds bilaterally - poor effort GI: Soft, nontender, nondistended, BS + x 4. MS: no deformity or atrophy. Skin: warm and dry, no rash. Neuro:  Strength and  sensation are intact. Psych: AAOx3.  Flat affect.  Labs    Chemistry Recent Labs  Lab 09/19/17 0612 09/20/17 0456 09/21/17 0550  NA 142 137 137  K 3.7 4.5 4.5  CL 106 106 106  CO2 24 26 22   GLUCOSE 240* 159* 170*  BUN 24* 35* 37*  CREATININE 1.68* 2.36* 2.10*  CALCIUM 9.3 8.7* 8.8*  GFRNONAA 38* 26* 29*  GFRAA 45* 30* 34*  ANIONGAP 12 5 9      Hematology Recent Labs  Lab 09/19/17 0612 09/20/17 0456  WBC 10.4 10.7*  RBC 3.06* 2.92*  HGB 9.2* 8.6*  HCT 27.8* 26.4*  MCV 91.1 90.4  MCH 30.0 29.6  MCHC 33.0 32.8  RDW 13.5 13.8  PLT 200 176    Cardiac Enzymes Recent Labs  Lab 09/19/17 0612 09/19/17 1112 09/19/17 1815 09/19/17 2209  TROPONINI 0.05* 0.28* 0.26* 0.24*     BNP Recent Labs  Lab 09/19/17 0612  BNP 313.0*     Radiology    No results found.  Telemetry    Rsr/sinus brady - Personally Reviewed  Cardiac Studies   2D Echocardiogram 7.17.2019  Study Conclusions  - Left ventricle: The cavity size was normal. Wall thickness was   increased increased in a pattern of mild to moderate LVH.   Systolic function was normal. The estimated  ejection fraction was   in the range of 55% to 60%. Wall motion was normal; there were no   regional wall motion abnormalities. Features are consistent with   a pseudonormal left ventricular filling pattern, with concomitant   abnormal relaxation and increased filling pressure (grade 2   diastolic dysfunction). Doppler parameters are consistent with   high ventricular filling pressure. - Aortic valve: Calcified annulus. Mildly thickened leaflets. - Mitral valve: Structurally normal valve. Transvalvular velocity   was within the normal range. There was no evidence for stenosis.   There was mild regurgitation. - Left atrium: The atrium was mildly dilated. - Right ventricle: The cavity size was normal. Wall thickness was   normal. Systolic function was mildly reduced. - Pulmonary arteries: Systolic pressure  was mildly increased, in   the range of 35 mm Hg to 40 mm Hg. - Inferior vena cava: The vessel was normal in size. The   respirophasic diameter changes were blunted (< 50%), consistent   with elevated central venous pressure. - Pericardium, extracardiac: There was a left pleural effusion. _____________   Patient Profile     77 y.o. male with h/o chronic HFpEF, HTN, HLD, DVT, DM, CKD3, anemia of chronic disease, MGUS, prostate CA, & with h/o intermittent CP for at least two years and recent admission 06/2017 with syncope that presented to [facility] ED 09/19/17 with worsening SOB and LE edema and consultation requested by Dr. Dahlia Client.  Assessment & Plan    1.  Acute on chronic diastolic CHF: Presented w/ dyspnea and lower ext edmea. BNP 313.  Responded well to IV lasix initially, with rise in BUN/Creat yesterday (35/2.36).  Lasix held 7/17 with plan to resume today.  +220 yesterday and only -400 since admission.  Wt stable @ 189 lbs last night.  Breathing and edema improved.  Euvolemic on exam.  It looks like dry wt ~ 188 lbs last year.  We will resume lasix today.  It's notable that he was only taking a total of 80mg  a week prior to admission (20 mg BID on Mon and Thurs only), with major limiting factor to lasix dosing being profound orthostasis.  As such, we'll resume lasix @ 20mg  QOD for the time being.  HR/BP stable on  blocker.  He is deconditioned and we will ask PT to see and ambulate.  2.  Anemia of chronic dzs:  H/H stable over the past year w/ previous extensive hematology w/u over the summer of 2018.  H/H 8.6/26.4.  I suspect this is contributing to DOE regardless of volume status.  3.  History of syncope/weakness: h/o syncope 2/2 hypervagotonia.  Wears abd binder @ home.  Will have to watch volume status closely as above.  4.  Essential HTN:  BP stable following reduction of benign essential tremor dose.  Losartan currently on hold.  5.  DM:  On insulin.  Per IM.  6.  Acute on chronic  stage III kidney dzs:  Stable this AM after holding lasix yesterday.  Follow with resumption.  7.  Elevated troponin:  In setting of #1.  Peak 0.28 on 7/16.  No chest pain.  Low risk stress test in 06/2017 - no ischemia, attenuation artifact.  In that setting, along with CKD, would not pursue invasive eval/cath.  Cont med Rx  asa/ blocker.  He is not on a statin but LDL only 43 in April.  Signed, Murray Hodgkins, NP  09/21/2017, 11:31 AM    For questions or updates, please contact  Please consult [email or web address] for contact info under Cardiology/STEMI.   Attending Note Patient seen and examined, agree with detailed note above,  Patient presentation and plan discussed on rounds.    shortness of breath is improved, still in the bed Reports that he has not ambulated yet, no notes from physical therapy Walks with a cane secondary to chronic back pain Sedentary at home  Lab work reviewed creatinine mild improvement  2.1, BUN 37  hematocrit 26  On exam  supine comfortable in bed no distress, no significant JVD lungs clear rales at bases,   poor respiratory effort,  heart sounds regular,  normal S1-S2 no murmurs appreciated abdomen soft nontender, no edema  A/P:  1. Acute on chronic diastolic congestive heart failure (HFpEF)  --suspect narrow therapeutic window given climbing creatinine with minimal diuresis  BNP 313. Recommend we restart Lasix 20 mg every other day--  Coreg  3.125 mg twice a day.  - No current plan to proceed with cardiac catheterization given on trending troponin and abnormal renal function, recent Myoview low risk  2. History of syncope and weakness Very deconditioned at baseline Sedentary most of the day walks with a walker at home, limited by chronic back pain We have ordered physical therapy---  History of syncope History of orthostasis  syncopal events likely 2/2 hyper vagotonia and PR and PP prolongation. Also sinus pause.Heart monitor evaluation  shows Wenckebach.    3. CKD III -Restart low-dose Lasix 20 every other day  5.Anemia of chronic disease in setting of CKD III  HCT 27.8  26.4 since admission.  Chronically low previously seen by hematology Likely contributing to weakness and shortness of breath  6.Essential HTN carvedilol 3.25 twice a day  losartan 12.5 daily   Greater than 50% was spent in counseling and coordination of care with patient Total encounter time 25 minutes or more   Signed: Esmond Plants  M.D., Ph.D. [facility]

## 2017-09-21 NOTE — Progress Notes (Signed)
[facility] [place] NAME: Jesse Dickson    MR#:  [id number]  DATE OF BIRTH:  1940/04/09  SUBJECTIVE:  CHIEF COMPLAINT:   Chief Complaint  Patient presents with  . Shortness of Breath  Patient seen today Has no shortness of breath No chest pain Off oxygen Decreased swelling in the legs  REVIEW OF SYSTEMS:    ROS  CONSTITUTIONAL: No documented fever. No fatigue, weakness. No weight gain, no weight loss.  EYES: No blurry or double vision.  ENT: No tinnitus. No postnasal drip. No redness of the oropharynx.  RESPIRATORY: No cough, no wheeze, no hemoptysis.  No dyspnea.  CARDIOVASCULAR: No chest pain. No orthopnea. No palpitations. No syncope.  GASTROINTESTINAL: No nausea, no vomiting or diarrhea. No abdominal pain. No melena or hematochezia.  GENITOURINARY: No dysuria or hematuria.  ENDOCRINE: No polyuria or nocturia. No heat or cold intolerance.  HEMATOLOGY: No anemia. No bruising. No bleeding.  INTEGUMENTARY: No rashes. No lesions.  MUSCULOSKELETAL: No arthritis. No swelling. No gout.  NEUROLOGIC: No numbness, tingling, or ataxia. No seizure-type activity.  PSYCHIATRIC: No anxiety. No insomnia. No ADD.   DRUG ALLERGIES:  No Known Allergies  VITALS:  Blood pressure 130/66, pulse 69, temperature 98.4 F (36.9 C), temperature source Oral, resp. rate 18, height 5\' 8"  (1.727 m), weight 85.9 kg (189 lb 4.8 oz), SpO2 95 %.  PHYSICAL EXAMINATION:   Physical Exam  GENERAL:  77 y.o.-year-old patient lying in the bed with no acute distress.  EYES: Pupils equal, round, reactive to light and accommodation. No scleral icterus. Extraocular muscles intact.  HEENT: Head atraumatic, normocephalic. Oropharynx and nasopharynx clear.  NECK:  Supple, no jugular venous distention. No thyroid enlargement, no tenderness.  LUNGS: Improved breath sounds bilaterally, decreased bibasilar rales. No use of accessory muscles of respiration.   CARDIOVASCULAR: S1, S2 normal. No murmurs, rubs, or gallops.  ABDOMEN: Soft, nontender, nondistended. Bowel sounds present. No organomegaly or mass.  EXTREMITIES: No cyanosis, clubbing  decreased edema b/l.    NEUROLOGIC: Cranial nerves II through XII are intact. No focal Motor or sensory deficits b/l.   PSYCHIATRIC: The patient is alert and oriented x 3.  SKIN: No obvious rash, lesion, or ulcer.   LABORATORY PANEL:   CBC Recent Labs  Lab 09/20/17 0456  WBC 10.7*  HGB 8.6*  HCT 26.4*  PLT 176   ------------------------------------------------------------------------------------------------------------------ Chemistries  Recent Labs  Lab 09/21/17 0550  NA 137  K 4.5  CL 106  CO2 22  GLUCOSE 170*  BUN 37*  CREATININE 2.10*  CALCIUM 8.8*   ------------------------------------------------------------------------------------------------------------------  Cardiac Enzymes Recent Labs  Lab 09/19/17 2209  TROPONINI 0.24*   ------------------------------------------------------------------------------------------------------------------  RADIOLOGY:  No results found.   ASSESSMENT AND PLAN:  77 year old male patient with history of chronic diastolic heart failure, chronic kidney disease stage III, GERD, hypertension, hyperlipidemia, diabetes mellitus under hospitalist service for increased shortness of breath.  -Acute on chronic diastolic heart failure exacerbation improved Off oxygen Oral Lasix restarted Cardiology f/u appreciated No acute cardiac intervention recommended Medical management only Recent myoview low risk  -Elevated troponin secondary to demand ischemia   Continue aspirin and betablocker  -Acute Kidney injury on Chronic kidney disease stage III Probably secondary to diuretics improved Oral Lasix restarted Appreciate nephrology follow-up Monitor renal function  -Hypertension Continue Coreg 3.25 mg twice daily Losartan 12.5 mg  daily  -DVT prophylaxis On anticoagulation with subcu heparin  All the records are reviewed and case discussed with Care  Management/Social Worker. Management plans discussed with the patient, family and they are in agreement.  CODE STATUS: Full code  DVT Prophylaxis: SCDs  TOTAL TIME TAKING CARE OF THIS PATIENT: 33 minutes.   POSSIBLE D/C IN 1 DAYS, DEPENDING ON CLINICAL CONDITION.  Saundra Shelling M.D on 09/21/2017 at 2:01 PM  Between 7am to 6pm - Pager - [phone]  After 6pm go to [email or web address] - password [email or web address] [facility]  Office  [phone]  CC: Primary care physician; Cletis Athens, MD  Note: This dictation was prepared with Dragon dictation along with smaller phrase technology. Any transcriptional errors that result from this process are unintentional.

## 2017-09-22 ENCOUNTER — Telehealth: Payer: Self-pay | Admitting: Internal Medicine

## 2017-09-22 LAB — BASIC METABOLIC PANEL
ANION GAP: 7 (ref 5–15)
BUN: 33 mg/dL — ABNORMAL HIGH (ref 8–23)
CALCIUM: 8.9 mg/dL (ref 8.9–10.3)
CO2: 26 mmol/L (ref 22–32)
CREATININE: 1.89 mg/dL — AB (ref 0.61–1.24)
Chloride: 108 mmol/L (ref 98–111)
GFR calc non Af Amer: 33 mL/min — ABNORMAL LOW (ref >60)
GFR, EST AFRICAN AMERICAN: 39 mL/min — AB (ref >60)
Glucose, Bld: 125 mg/dL — ABNORMAL HIGH (ref 70–99)
Potassium: 4.1 mmol/L (ref 3.5–5.1)
SODIUM: 141 mmol/L (ref 135–145)

## 2017-09-22 LAB — GLUCOSE, CAPILLARY
GLUCOSE-CAPILLARY: 110 mg/dL — AB (ref 70–99)
Glucose-Capillary: 231 mg/dL — ABNORMAL HIGH (ref 70–99)

## 2017-09-22 MED ORDER — FUROSEMIDE 20 MG PO TABS: 20.0000 mg | ORAL_TABLET | Freq: Every day | ORAL | 1 refills | Status: DC

## 2017-09-22 MED ORDER — CARVEDILOL 3.125 MG PO TABS: 3.1250 mg | ORAL_TABLET | Freq: Two times a day (BID) | ORAL | 0 refills | Status: DC

## 2017-09-22 NOTE — Progress Notes (Signed)
[facility] Next of kin: [phone] [place] [address]   If patient discharges after hours, please call [phone].   Florene Glen 09/22/2017, 10:34 AM

## 2017-09-22 NOTE — Progress Notes (Addendum)
Progress Note  Patient Name: Acie Custis Date of Encounter: 09/22/2017  Primary Cardiologist: Nelva Bush, MD  Subjective   Breathing stable, no chest pain.   Edema improved. Walked with PT Still with mild cough Wife at the bedside "would like to go home"  Inpatient Medications    Scheduled Meds: . aspirin EC  81 mg Oral Daily  . carvedilol  3.125 mg Oral BID  . furosemide  20 mg Oral Daily  . gabapentin  200 mg Oral Daily  . heparin injection (subcutaneous)  5,000 Units Subcutaneous Q8H  . insulin aspart  0-15 Units Subcutaneous TID WC  . insulin aspart  0-5 Units Subcutaneous QHS  . linaclotide  290 mcg Oral QAC breakfast  . protein supplement shake  11 oz Oral BID BM  . sodium chloride flush  3 mL Intravenous Q12H   Continuous Infusions: . sodium chloride     PRN Meds: sodium chloride, acetaminophen **OR** acetaminophen, albuterol, ondansetron **OR** ondansetron (ZOFRAN) IV, senna-docusate, sodium chloride flush   Vital Signs    Vitals:   09/21/17 2007 09/21/17 2020 09/22/17 0403 09/22/17 0727  BP: 126/68  128/60 127/63  Pulse: 68  72 70  Resp: 18  18   Temp: 98.4 F (36.9 C)  98.4 F (36.9 C) 98.3 F (36.8 C)  TempSrc: Oral  Oral Oral  SpO2: 100% 95% 99% 100%  Weight:   188 lb 4.8 oz (85.4 kg)   Height:        Intake/Output Summary (Last 24 hours) at 09/22/2017 0924 Last data filed at 09/22/2017 0426 Gross per 24 hour  Intake 240 ml  Output 500 ml  Net -260 ml   Filed Weights   09/20/17 1028 09/21/17 0332 09/22/17 0403  Weight: 190 lb 4.1 oz (86.3 kg) 189 lb 4.8 oz (85.9 kg) 188 lb 4.8 oz (85.4 kg)    Physical Exam   GEN: Well nourished, well developed, in no acute distress.  HEENT: Grossly normal.  Neck: Supple, no JVD, carotid bruits, or masses. Cardiac: RRR, no murmurs, rubs, or gallops. No clubbing, cyanosis, edema.  Radials/DP/PT 2+ and equal bilaterally.  Respiratory:  Poor effort, Respirations regular and unlabored,  diminished breath sounds bilaterally at bases GI: Soft, nontender, nondistended, BS + x 4. MS: no deformity or atrophy. Skin: warm and dry, no rash. Neuro:  Strength and sensation are intact. Psych: AAOx3.  Flat affect.  Labs    Chemistry Recent Labs  Lab 09/20/17 0456 09/21/17 0550 09/22/17 0441  NA 137 137 141  K 4.5 4.5 4.1  CL 106 106 108  CO2 26 22 26   GLUCOSE 159* 170* 125*  BUN 35* 37* 33*  CREATININE 2.36* 2.10* 1.89*  CALCIUM 8.7* 8.8* 8.9  GFRNONAA 26* 29* 33*  GFRAA 30* 34* 39*  ANIONGAP 5 9 7      Hematology Recent Labs  Lab 09/19/17 0612 09/20/17 0456  WBC 10.4 10.7*  RBC 3.06* 2.92*  HGB 9.2* 8.6*  HCT 27.8* 26.4*  MCV 91.1 90.4  MCH 30.0 29.6  MCHC 33.0 32.8  RDW 13.5 13.8  PLT 200 176    Cardiac Enzymes Recent Labs  Lab 09/19/17 0612 09/19/17 1112 09/19/17 1815 09/19/17 2209  TROPONINI 0.05* 0.28* 0.26* 0.24*     BNP Recent Labs  Lab 09/19/17 0612  BNP 313.0*     Radiology    No results found.  Telemetry    Rsr/sinus brady - Personally Reviewed  Cardiac Studies   2D Echocardiogram 7.17.2019  Study  Conclusions  - Left ventricle: The cavity size was normal. Wall thickness was   increased increased in a pattern of mild to moderate LVH.   Systolic function was normal. The estimated ejection fraction was   in the range of 55% to 60%. Wall motion was normal; there were no   regional wall motion abnormalities. Features are consistent with   a pseudonormal left ventricular filling pattern, with concomitant   abnormal relaxation and increased filling pressure (grade 2   diastolic dysfunction). Doppler parameters are consistent with   high ventricular filling pressure. - Aortic valve: Calcified annulus. Mildly thickened leaflets. - Mitral valve: Structurally normal valve. Transvalvular velocity   was within the normal range. There was no evidence for stenosis.   There was mild regurgitation. - Left atrium: The atrium was  mildly dilated. - Right ventricle: The cavity size was normal. Wall thickness was   normal. Systolic function was mildly reduced. - Pulmonary arteries: Systolic pressure was mildly increased, in   the range of 35 mm Hg to 40 mm Hg. - Inferior vena cava: The vessel was normal in size. The   respirophasic diameter changes were blunted (< 50%), consistent   with elevated central venous pressure. - Pericardium, extracardiac: There was a left pleural effusion. _____________   Patient Profile     77 y.o. male with h/o chronic HFpEF, HTN, HLD, DVT, DM, CKD3, anemia of chronic disease, MGUS, prostate CA, & with h/o intermittent CP for at least two years and recent admission 06/2017 with syncope that presented to [facility] ED 09/19/17 with worsening SOB and LE edema and consultation requested by Dr. Dahlia Client.  Assessment & Plan    1. Acute on chronic diastolic congestive heart failure (HFpEF)   narrow therapeutic window given climbing creatinine with minimal diuresis --Lasix 20 mg daily at home (was previously taking 2x per weekgiven hx of orthostasis) --Coreg  3.125 mg twice a day.  Losartan on hold for renal function -abnormal renal function, recent Myoview low risk --cough from small  pleural effusion. Will be slow to resolve in setting of anemia  2.syncope and weakness Very deconditioned at baseline Sedentary most of the day walks with a walker at home, limited by chronic back pain physical therapy 2x per week  3. History of syncope orthostasis hx syncopal events likely 2/2 hyper vagotonia and PR and PP prolongation. Also sinus pause.Heart monitor evaluation shows Wenckebach.   pressures stable  3. CKD III low-dose Lasix 20 daily  5.Anemia of chronic disease in setting of CKD III  HCT 26.4 since admission.  Chronically low previously seen by hematology Likely contributing to weakness and shortness of  Needs follow up with nephrology/hematology (consider epo?) Goal HBG >10    6.Essential HTN carvedilol 3.25 twice a day  Long CHF education discussion Greater than 50% was spent in counseling and coordination of care with patient Total encounter time 35 minutes or more   Signed: Esmond Plants  M.D., Ph.D. [facility]

## 2017-09-22 NOTE — Care Management Important Message (Signed)
Copy of signed IM left with patient in room.  

## 2017-09-22 NOTE — Discharge Summary (Signed)
[place] [facility] [place] NAME: Jesse Dickson    MR#:  [id number]  DATE OF BIRTH:  08-08-1940  DATE OF ADMISSION:  09/19/2017 ADMITTING PHYSICIAN: Saundra Shelling, MD  DATE OF DISCHARGE: 09/22/2017 12:10 PM  PRIMARY CARE PHYSICIAN: Cletis Athens, MD   ADMISSION DIAGNOSIS:  Shortness of breath [R06.02] Acute on chronic congestive heart failure, unspecified heart failure type (HCC) [I50.9] CHF (congestive heart failure) ([place]) [I50.9] Elevated troponin Chronic kidney disease stage III DISCHARGE DIAGNOSIS:  Acute on chronic diastolic heart failure exacerbation Acute on chronic kidney injury stage III Elevated troponin Dyspnea secondary to heart failure exacerbation  SECONDARY DIAGNOSIS:   Past Medical History:  Diagnosis Date  . Chronic diastolic CHF (congestive heart failure) ([place])    a. TTE 05/2015 EF > 55%  . Chronic kidney disease (CKD), stage III (moderate) (HCC)   . DVT (deep venous thrombosis) ([place])   . GERD (gastroesophageal reflux disease)   . Hypercholesteremia   . Hypertension   . Insulin dependent diabetes mellitus ([place])   . MGUS (monoclonal gammopathy of unknown significance)   . Prostate CA ([place]) 2015   only surgery per pt     ADMITTING HISTORY Jesse Dickson  is a 77 y.o. male with a known history of chronic diastolic heart failure, chronic kidney disease stage III, GERD, hyperlipidemia, hypertension presented to the emergency room for increased shortness of breath for the last few days.  Patient was in respiratory distress in the emergency BiPAP and stabilized initially.  BNP was elevated patient received IV Lasix for diuresis.  Patient felt much better and less short of breath and he was weaned of BiPAP in the emergency room.  Hospitalist service was consulted.  No complaints of any chest pain.  No fever and chills.  HOSPITAL COURSE:  Patient was admitted to medical floor after being weaned off BiPAP.  He was put on  IV Lasix for diuresis for heart failure exacerbation.  Patient was seen by cardiology during the stay in the hospital.  His troponins were elevated and he was started on IV heparin drip for anticoagulation.  His kidney functions worsened and Lasix was discontinued and nephrology consultation was also done during the hospitalization.  Cardiology services did not recommend any cardiac catheter intervention in view of poor renal function and recommended only medical management.  Patient continued aspirin, beta-blocker and heparin drip was stopped.  Elevated troponin was secondary to demand ischemia.  His Lasix was restarted after her renal functions improved.  Patient will be discharged home with home health services and with heart failure discharge plan.  CONSULTS OBTAINED:  Treatment Team:  Nelva Bush, MD Lavonia Dana, MD  DRUG ALLERGIES:  No Known Allergies  DISCHARGE MEDICATIONS:   Allergies as of 09/22/2017   No Known Allergies     Medication List    TAKE these medications   aspirin EC 81 MG tablet Take 81 mg by mouth daily.   atorvastatin 20 MG tablet Commonly known as:  LIPITOR Take 20 mg by mouth daily.   carvedilol 3.125 MG tablet Commonly known as:  COREG Take 1 tablet (3.125 mg total) by mouth 2 (two) times daily. What changed:    medication strength  how much to take   furosemide 20 MG tablet Commonly known as:  LASIX Take 1 tablet (20 mg total) by mouth daily. What changed:    when to take this  additional instructions   gabapentin 100 MG capsule Commonly known as:  NEURONTIN Take 200 mg by mouth daily.   INSULIN SYRINGE 1CC/30GX5/16" 30G X 5/16" 1 ML Misc Use 1 Dose 4 (four) times daily.   linaclotide 290 MCG Caps capsule Commonly known as:  LINZESS Take 1 capsule (290 mcg total) by mouth daily before breakfast.   ONETOUCH VERIO test strip Generic drug:  glucose blood CHECK DAILY   TRESIBA FLEXTOUCH 100 UNIT/ML Sopn FlexTouch Pen Generic  drug:  insulin degludec INJECT 30UNITS WITH AUTO-INJECTOR ONCE DAILY if CBG > 150 mg/dl   VENTOLIN HFA 108 (90 Base) MCG/ACT inhaler Generic drug:  albuterol INHALE 1 PUFF BY MOUTH 3 TIMES A DAY AS NEEDED            Durable Medical Equipment  (From admission, onward)        Start     Ordered   09/22/17 1010  Heart failure home health orders  (Heart failure home health orders / Face to face)  Once    Comments:  Heart Failure Follow-up Care:  Verify follow-up appointments per Patient Discharge Instructions. Confirm transportation arranged. Reconcile home medications with discharge medication list. Remove discontinued medications from use. Assist patient/caregiver to manage medications using pill box. Reinforce low sodium food selection Assessments: Vital signs and oxygen saturation at each visit. Assess home environment for safety concerns, caregiver support and availability of low-sodium foods. Consult Education officer, museum, PT/OT, Dietitian, and CNA based on assessments. Perform comprehensive cardiopulmonary assessment. Notify MD for any change in condition or weight gain of 3 pounds in one day or 5 pounds in one week with symptoms. Daily Weights and Symptom Monitoring: Ensure patient has access to scales. Teach patient/caregiver to weigh daily before breakfast and after voiding using same scale and record.    Teach patient/caregiver to track weight and symptoms and when to notify Provider. Activity: Develop individualized activity plan with patient/caregiver.   Question Answer Comment  Heart Failure Follow-up Care [facility] ([facility]) [facility] at [phone]   Obtain the following labs Basic Metabolic Panel   Lab frequency Weekly   Fax lab results to AHF Clinic at [phone]   Diet Low Sodium Heart Healthy   Fluid restrictions: 1500 mL Fluid   Skilled Nurse to notify MD of weight trends weekly for first 2 weeks. May fax or call: AHF Clinic at [phone] (fax) or [phone]    Consult: Case manager   Initiate Heart Failure Clinic Diuretic Protocol to be used by [place] only ( to be ordered by Heart Failure Team Providers Only) Yes      09/22/17 1011      Today  Patient seen on the day of discharge No shortness of breath  no swelling in the legs No chest pain   VITAL SIGNS:  Blood pressure 127/63, pulse 70, temperature 98.3 F (36.8 C), temperature source Oral, resp. rate 18, height 5\' 8"  (1.727 m), weight 85.4 kg (188 lb 4.8 oz), SpO2 100 %.  I/O:    Intake/Output Summary (Last 24 hours) at 09/22/2017 1413 Last data filed at 09/22/2017 1024 Gross per 24 hour  Intake 480 ml  Output 500 ml  Net -20 ml    PHYSICAL EXAMINATION:  Physical Exam  GENERAL:  77 y.o.-year-old patient lying in the bed with no acute distress.  LUNGS: Normal breath sounds bilaterally, no wheezing, rales,rhonchi or crepitation. No use of accessory muscles of respiration.  CARDIOVASCULAR: S1, S2 normal. No murmurs, rubs, or gallops.  ABDOMEN: Soft, non-tender, non-distended. Bowel sounds present. No organomegaly or mass.  NEUROLOGIC: Moves all 4 extremities. PSYCHIATRIC: The patient is alert and oriented x 3.  SKIN: No obvious rash, lesion, or ulcer.   DATA REVIEW:   CBC Recent Labs  Lab 09/20/17 0456  WBC 10.7*  HGB 8.6*  HCT 26.4*  PLT 176    Chemistries  Recent Labs  Lab 09/22/17 0441  NA 141  K 4.1  CL 108  CO2 26  GLUCOSE 125*  BUN 33*  CREATININE 1.89*  CALCIUM 8.9    Cardiac Enzymes Recent Labs  Lab 09/19/17 2209  TROPONINI 0.24*    Microbiology Results  Results for orders placed or performed during the hospital encounter of 08/06/16  Urine culture     Status: Abnormal   Collection Time: 08/06/16 12:59 PM  Result Value Ref Range Status   Specimen Description URINE, RANDOM  Final   Special Requests NONE  Final   Culture MULTIPLE SPECIES PRESENT, SUGGEST RECOLLECTION (A)  Final   Report Status 08/08/2016 FINAL  Final     RADIOLOGY:  No results found.  Follow up with PCP in 1 week.  Management plans discussed with the patient, family and they are in agreement.  CODE STATUS: Full code    Code Status Orders  (From admission, onward)        Start     Ordered   09/19/17 1054  Full code  Continuous     09/19/17 1054    Code Status History    Date Active Date Inactive Code Status Order ID Comments User Context   06/28/2017 0416 06/30/2017 1830 Full Code [id number]  Harrie Foreman, MD Inpatient      TOTAL TIME TAKING CARE OF THIS PATIENT ON DAY OF DISCHARGE: more than 35 minutes.   Saundra Shelling M.D on 09/22/2017 at 2:13 PM  Between 7am to 6pm - Pager - [phone]  After 6pm go to [email or web address] - password [email or web address]  Office  [phone]  CC: Primary care physician; Cletis Athens, MD  Note: This dictation was prepared with Dragon dictation along with smaller phrase technology. Any transcriptional errors that result from this process are unintentional.

## 2017-09-22 NOTE — Telephone Encounter (Signed)
TCM....  Patient is being discharged later today   They saw Dr Gollan/Dr End   They are scheduled to see Dr Caryl Comes  on 09/28/17   They were seen for CHF  They need to be seen within 1 Week   Pt was not wait list   Please call

## 2017-09-22 NOTE — Telephone Encounter (Signed)
Patient discharged today 09/22/17.

## 2017-09-22 NOTE — Care Management Note (Signed)
Case Management Note  Patient Details  Name: Jesse Dickson MRN: [id number] Date of Birth: November 22, 1940   Patient to discharge home today.  Patient lives at home with wife.  Patient admitted with CHF exacerbation.  PCP [place].  Patient has Rollator, cane, and shower seat in the home.  PT has assessed patient and recommends home health PT.  Patient agreeable to home health services.  Does not have a preference of home health agneic.  Referral made to [facility] with [place].  Heart Failure protocol signed by MD and provided to [facility] with [place].  RNCM signing off.   Subjective/Objective:                    Action/Plan:   Expected Discharge Date:  09/22/17               Expected Discharge Plan:  [place]  In-House Referral:     Discharge planning Services  CM Consult  Post Acute Care Choice:  Home Health Choice offered to:  Patient  DME Arranged:    DME Agency:     HH Arranged:  RN, PT [place] Agency:  [place]  Status of Service:  Completed, signed off  If discussed at [place] of Stay Meetings, dates discussed:    Additional Comments:  Beverly Sessions, RN 09/22/2017, 10:24 AM

## 2017-09-22 NOTE — Progress Notes (Signed)
[facility]  ROUNDING NOTE   Subjective:   Wife at bedside.   UOP 900 recorded  Creatinine 1.89 (2.1)  Objective:  Vital signs in last 24 hours:  Temp:  [97.7 F (36.5 C)-98.4 F (36.9 C)] 98.3 F (36.8 C) (07/19 0727) Pulse Rate:  [68-72] 70 (07/19 0727) Resp:  [18] 18 (07/19 0403) BP: (109-128)/(60-80) 127/63 (07/19 0727) SpO2:  [95 %-100 %] 100 % (07/19 0727) Weight:  [85.4 kg (188 lb 4.8 oz)] 85.4 kg (188 lb 4.8 oz) (07/19 0403)  Weight change: -0.888 kg (-1 lb 15.3 oz) Filed Weights   09/20/17 1028 09/21/17 0332 09/22/17 0403  Weight: 86.3 kg (190 lb 4.1 oz) 85.9 kg (189 lb 4.8 oz) 85.4 kg (188 lb 4.8 oz)    Intake/Output: I/O last 3 completed shifts: In: 240 [P.O.:240] Out: 900 [Urine:900]   Intake/Output this shift:  Total I/O In: 240 [P.O.:240] Out: -   Physical Exam: General: NAD, sitting in chair   Head: Normocephalic, atraumatic. Moist oral mucosal membranes  Eyes: Anicteric, PERRL  Neck: Supple, trachea midline  Lungs:  Clear to auscultation  Heart: Regular rate and rhythm  Abdomen:  Soft, nontender,   Extremities: no peripheral edema.  Neurologic: Nonfocal, moving all four extremities  Skin: No lesions        Basic Metabolic Panel: Recent Labs  Lab 09/19/17 0612 09/20/17 0456 09/21/17 0550 09/22/17 0441  NA 142 137 137 141  K 3.7 4.5 4.5 4.1  CL 106 106 106 108  CO2 24 26 22 26   GLUCOSE 240* 159* 170* 125*  BUN 24* 35* 37* 33*  CREATININE 1.68* 2.36* 2.10* 1.89*  CALCIUM 9.3 8.7* 8.8* 8.9    Liver Function Tests: No results for input(s): AST, ALT, ALKPHOS, BILITOT, PROT, ALBUMIN in the last 168 hours. No results for input(s): LIPASE, AMYLASE in the last 168 hours. No results for input(s): AMMONIA in the last 168 hours.  CBC: Recent Labs  Lab 09/19/17 0612 09/20/17 0456  WBC 10.4 10.7*  HGB 9.2* 8.6*  HCT 27.8* 26.4*  MCV 91.1 90.4  PLT 200 176    Cardiac Enzymes: Recent Labs  Lab 09/19/17 0612  09/19/17 1112 09/19/17 1815 09/19/17 2209  TROPONINI 0.05* 0.28* 0.26* 0.24*    BNP: Invalid input(s): POCBNP  CBG: Recent Labs  Lab 09/21/17 1117 09/21/17 1659 09/21/17 2050 09/22/17 0729 09/22/17 1143  GLUCAP 191* 175* 143* 110* 231*    Microbiology: Results for orders placed or performed during the hospital encounter of 08/06/16  Urine culture     Status: Abnormal   Collection Time: 08/06/16 12:59 PM  Result Value Ref Range Status   Specimen Description URINE, RANDOM  Final   Special Requests NONE  Final   Culture MULTIPLE SPECIES PRESENT, SUGGEST RECOLLECTION (A)  Final   Report Status 08/08/2016 FINAL  Final    Coagulation Studies: Recent Labs    09/19/17 1331  LABPROT 15.0  INR 1.19    Urinalysis: No results for input(s): COLORURINE, LABSPEC, PHURINE, GLUCOSEU, HGBUR, BILIRUBINUR, KETONESUR, PROTEINUR, UROBILINOGEN, NITRITE, LEUKOCYTESUR in the last 72 hours.  Invalid input(s): APPERANCEUR    Imaging: No results found.   Medications:   . sodium chloride     . aspirin EC  81 mg Oral Daily  . carvedilol  3.125 mg Oral BID  . furosemide  20 mg Oral Daily  . gabapentin  200 mg Oral Daily  . heparin injection (subcutaneous)  5,000 Units Subcutaneous Q8H  . insulin aspart  0-15  Units Subcutaneous TID WC  . insulin aspart  0-5 Units Subcutaneous QHS  . linaclotide  290 mcg Oral QAC breakfast  . protein supplement shake  11 oz Oral BID BM  . sodium chloride flush  3 mL Intravenous Q12H   sodium chloride, acetaminophen **OR** acetaminophen, albuterol, ondansetron **OR** ondansetron (ZOFRAN) IV, senna-docusate, sodium chloride flush  Assessment/ Plan:  Mr. Jesse Dickson is a 77 year old [place] male with past medical history of hyperlipidemia, diabetes mellitus type, chronic diastolic heart failure, GERD, prostate cancer, history of left lower extremity DVT admitted to [facility] on 09/19/2017 for acute exacerbation of diastolic congestive heart failure.    1. Acute renal failure on Chronic kidney disease stage III with proteinuria Chronic kidney disease secondary to diabetic nephropathy and hypertension Acute renal failure secondary to acute cardio renal syndrome - holding losartan  2. Hypertension with acute exacerbation of diastolic congestive heart failure -  PO furosemide. Home dose of 20mg  bid.   3. Anemia of chronic kidney disease: with history of light chain disease:  - follows with Dr. Janese Banks, [place] therapy with wife.   4. Diabetes mellitus type II with chronic kidney disease: insulin dependent. Hemoglobin A1c of 6.9% in April.    LOS: 3 Jesse Dickson 7/19/201911:46 AM

## 2017-09-22 NOTE — Progress Notes (Signed)
RN removed IV.  Patient is leaving in a private vehicle with his wife and another relative.  Phillis Knack, RN

## 2017-09-24 DIAGNOSIS — N183 Chronic kidney disease, stage 3 (moderate): Secondary | ICD-10-CM | POA: Diagnosis not present

## 2017-09-24 DIAGNOSIS — K219 Gastro-esophageal reflux disease without esophagitis: Secondary | ICD-10-CM | POA: Diagnosis not present

## 2017-09-24 DIAGNOSIS — E1122 Type 2 diabetes mellitus with diabetic chronic kidney disease: Secondary | ICD-10-CM | POA: Diagnosis not present

## 2017-09-24 DIAGNOSIS — I130 Hypertensive heart and chronic kidney disease with heart failure and stage 1 through stage 4 chronic kidney disease, or unspecified chronic kidney disease: Secondary | ICD-10-CM | POA: Diagnosis not present

## 2017-09-24 DIAGNOSIS — M545 Low back pain: Secondary | ICD-10-CM | POA: Diagnosis not present

## 2017-09-24 DIAGNOSIS — D631 Anemia in chronic kidney disease: Secondary | ICD-10-CM | POA: Diagnosis not present

## 2017-09-24 DIAGNOSIS — E1140 Type 2 diabetes mellitus with diabetic neuropathy, unspecified: Secondary | ICD-10-CM | POA: Diagnosis not present

## 2017-09-24 DIAGNOSIS — G8929 Other chronic pain: Secondary | ICD-10-CM | POA: Diagnosis not present

## 2017-09-24 DIAGNOSIS — I890 Lymphedema, not elsewhere classified: Secondary | ICD-10-CM | POA: Diagnosis not present

## 2017-09-24 DIAGNOSIS — I5032 Chronic diastolic (congestive) heart failure: Secondary | ICD-10-CM | POA: Diagnosis not present

## 2017-09-26 NOTE — Telephone Encounter (Signed)
Patient contacted regarding discharge from [facility] on 09/22/17.   Patient understands to follow up with provider ? On 09/28/17 at 4 pm at [facility].  Patient understands discharge instructions? Yes  Patient understands medications and regiment? Yes  Patient understands to bring all medications to this visit? Yes

## 2017-09-28 ENCOUNTER — Encounter: Payer: Self-pay | Admitting: Internal Medicine

## 2017-09-28 ENCOUNTER — Ambulatory Visit (INDEPENDENT_AMBULATORY_CARE_PROVIDER_SITE_OTHER): Payer: Medicare HMO | Admitting: Internal Medicine

## 2017-09-28 VITALS — BP 136/68 | HR 77 | Ht 68.0 in | Wt 193.0 lb

## 2017-09-28 DIAGNOSIS — R0602 Shortness of breath: Secondary | ICD-10-CM

## 2017-09-28 DIAGNOSIS — I10 Essential (primary) hypertension: Secondary | ICD-10-CM

## 2017-09-28 DIAGNOSIS — R55 Syncope and collapse: Secondary | ICD-10-CM

## 2017-09-28 DIAGNOSIS — I5032 Chronic diastolic (congestive) heart failure: Secondary | ICD-10-CM

## 2017-09-28 NOTE — Progress Notes (Signed)
Patient Care Team: Cletis Athens, MD as PCP - General (Internal Medicine) End, Harrell Gave, MD as PCP - Cardiology (Cardiology)   HPI  Jesse Dickson is a 77 y.o. male Seen following a recent hospitalization for acute on chronic HFpEF.  I had seen him earlier this spring for syncope associated with PP and PR prolongation in the context of urinating.   DATE TEST EF   3/17 Echo   55 %   4/19 Echo   60-65 % MR mild-mod  4/19 Myoview 51% No Ischemia  7/19 Echo  55-60% LVH-mod      Date Cr K Hgb  4//19 2.02 4.3 8.2 Chronic   7/19 2.36>>1.89 4.1 8.6   He is here for transition of care visit following hospitalization  No complaints of sob, but major fatigue which has been problematic for more than a year or two  He has sleep disordered breathing w apnea      Past Medical History:  Diagnosis Date  . Chronic diastolic CHF (congestive heart failure) ([place])    a. TTE 05/2015 EF > 55%  . Chronic kidney disease (CKD), stage III (moderate) ([facility])   . DVT (deep venous thrombosis) ([place])   . GERD (gastroesophageal reflux disease)   . Hypercholesteremia   . Hypertension   . Insulin dependent diabetes mellitus ([place])   . MGUS (monoclonal gammopathy of unknown significance)   . Prostate CA ([place]) 2015   only surgery per pt    Past Surgical History:  Procedure Laterality Date  . PROSTATECTOMY      Current Meds  Medication Sig  . aspirin EC 81 MG tablet Take 81 mg by mouth daily.   Marland Kitchen atorvastatin (LIPITOR) 20 MG tablet Take 20 mg by mouth daily.  . carvedilol (COREG) 3.125 MG tablet Take 1 tablet (3.125 mg total) by mouth 2 (two) times daily.  . furosemide (LASIX) 20 MG tablet Take 1 tablet (20 mg total) by mouth daily.  Marland Kitchen gabapentin (NEURONTIN) 100 MG capsule Take 200 mg by mouth daily.  . Insulin Syringe-Needle U-100 (INSULIN SYRINGE 1CC/30GX5/16") 30G X 5/16" 1 ML MISC Use 1 Dose 4 (four) times daily.  Marland Kitchen linaclotide (LINZESS) 290 MCG CAPS capsule Take 1  capsule (290 mcg total) by mouth daily before breakfast.  . losartan (COZAAR) 25 MG tablet Take 25 mg by mouth daily.  Glory Rosebush VERIO test strip CHECK DAILY  . TRESIBA FLEXTOUCH 100 UNIT/ML SOPN FlexTouch Pen INJECT 30UNITS WITH AUTO-INJECTOR ONCE DAILY if CBG > 150 mg/dl  . VENTOLIN HFA 108 (90 Base) MCG/ACT inhaler INHALE 1 PUFF BY MOUTH 3 TIMES A DAY AS NEEDED    No Known Allergies    Review of Systems negative except from HPI and PMH  Physical Exam BP 136/68 (BP Location: Left Arm, Patient Position: Sitting, Cuff Size: Normal)   Pulse 77   Ht 5\' 8"  (1.727 m)   Wt 193 lb (87.5 kg)   BMI 29.35 kg/m  Well developed and well nourished in no acute distress HENT normal E scleral and icterus clear Neck Supple JVP flat; carotids brisk and full Clear to ausculation Regular rate and rhythm, no murmurs gallops or rub Soft with active bowel sounds No clubbing cyanosis Trace Edema Alert and oriented, grossly normal motor and sensory function Skin Warm and Dry      Assessment and Plan:  Syncope associated with PR and PP prolongation and a sinus pause  Orthostatic intolerance  HFpEF  Diabetes with diabetic neuropathy  Grade 3/4 chronic kidney disease  Anemia  Left ventricular hypertrophy--MR-mild/moderate  Sleep disordered breathing and daytime somnolence  Euvolemic continue current meds  Fatigue is multifactorial, and contributors likely obesity, anemia, poor conditioning, but I strongly suspect sleep apnea.  There may also be a component of his medications; as his medications have been chronic, I have suggested that he discuss with Dr. Rebecka Apley tomorrow both the sleep study as well as try to link any of his past medications with his fatigue.  His anemia may also be a therapeutic target   Current medicines are reviewed at length with the patient today .  The patient does not  have concerns regarding medicines.

## 2017-09-28 NOTE — Patient Instructions (Signed)
Medication Instructions: - no changes  Labwork: - none ordered  Procedures/Testing: - none ordered  Follow-Up: - as needed  Any Additional Special Instructions Will Be Listed Below (If Applicable).     If you need a refill on your cardiac medications before your next appointment, please call your pharmacy.

## 2017-09-29 ENCOUNTER — Encounter: Payer: Self-pay | Admitting: Family

## 2017-09-29 ENCOUNTER — Ambulatory Visit: Payer: Medicare HMO | Attending: Family | Admitting: Family

## 2017-09-29 VITALS — BP 101/55 | HR 77 | Resp 18 | Ht 68.0 in | Wt 192.4 lb

## 2017-09-29 DIAGNOSIS — Z7982 Long term (current) use of aspirin: Secondary | ICD-10-CM | POA: Insufficient documentation

## 2017-09-29 DIAGNOSIS — Z0000 Encounter for general adult medical examination without abnormal findings: Secondary | ICD-10-CM | POA: Diagnosis not present

## 2017-09-29 DIAGNOSIS — I10 Essential (primary) hypertension: Secondary | ICD-10-CM

## 2017-09-29 DIAGNOSIS — Z86718 Personal history of other venous thrombosis and embolism: Secondary | ICD-10-CM | POA: Diagnosis not present

## 2017-09-29 DIAGNOSIS — I130 Hypertensive heart and chronic kidney disease with heart failure and stage 1 through stage 4 chronic kidney disease, or unspecified chronic kidney disease: Secondary | ICD-10-CM | POA: Insufficient documentation

## 2017-09-29 DIAGNOSIS — E119 Type 2 diabetes mellitus without complications: Secondary | ICD-10-CM

## 2017-09-29 DIAGNOSIS — N183 Chronic kidney disease, stage 3 (moderate): Secondary | ICD-10-CM | POA: Diagnosis not present

## 2017-09-29 DIAGNOSIS — E7800 Pure hypercholesterolemia, unspecified: Secondary | ICD-10-CM | POA: Insufficient documentation

## 2017-09-29 DIAGNOSIS — I890 Lymphedema, not elsewhere classified: Secondary | ICD-10-CM | POA: Diagnosis not present

## 2017-09-29 DIAGNOSIS — IMO0001 Reserved for inherently not codable concepts without codable children: Secondary | ICD-10-CM

## 2017-09-29 DIAGNOSIS — I509 Heart failure, unspecified: Secondary | ICD-10-CM | POA: Diagnosis not present

## 2017-09-29 DIAGNOSIS — Z833 Family history of diabetes mellitus: Secondary | ICD-10-CM | POA: Insufficient documentation

## 2017-09-29 DIAGNOSIS — I5032 Chronic diastolic (congestive) heart failure: Secondary | ICD-10-CM | POA: Diagnosis not present

## 2017-09-29 DIAGNOSIS — Z8546 Personal history of malignant neoplasm of prostate: Secondary | ICD-10-CM | POA: Insufficient documentation

## 2017-09-29 DIAGNOSIS — Z794 Long term (current) use of insulin: Secondary | ICD-10-CM | POA: Insufficient documentation

## 2017-09-29 DIAGNOSIS — E1122 Type 2 diabetes mellitus with diabetic chronic kidney disease: Secondary | ICD-10-CM | POA: Insufficient documentation

## 2017-09-29 DIAGNOSIS — R5383 Other fatigue: Secondary | ICD-10-CM | POA: Diagnosis present

## 2017-09-29 DIAGNOSIS — R140 Abdominal distension (gaseous): Secondary | ICD-10-CM | POA: Diagnosis not present

## 2017-09-29 DIAGNOSIS — Z79899 Other long term (current) drug therapy: Secondary | ICD-10-CM | POA: Insufficient documentation

## 2017-09-29 DIAGNOSIS — R0602 Shortness of breath: Secondary | ICD-10-CM | POA: Diagnosis not present

## 2017-09-29 DIAGNOSIS — K219 Gastro-esophageal reflux disease without esophagitis: Secondary | ICD-10-CM | POA: Insufficient documentation

## 2017-09-29 DIAGNOSIS — Z8489 Family history of other specified conditions: Secondary | ICD-10-CM | POA: Diagnosis not present

## 2017-09-29 NOTE — Progress Notes (Signed)
Patient ID: Jesse Dickson, male    DOB: 10/19/1940, 77 y.o.   MRN: [id number]  HPI  Jesse Dickson is a 77 y/o male with a history of prostate cancer, DM, hyperlipidemia, HTN, CKD, DVT, GERD and chronic heart failure.   Echo report from 09/19/17 reviewed and showed an EF of 55-60% along with mild MR and mildly increased PA pressure of 35-40 mmHg. Echo report from 06/28/17 reviewed and showed an EF of 60-65% along with mild/mod MR and a mildly elevated PA pressure of 36 mm Hg.   Admitted 09/19/17 due to acute on chronic HF. Initially needed bipap and then onto room air. Needed IV lasix and then transitioned back to oral diuretics. Cardiology consult obtained. Nephrology consult also obtained due to worsening renal function which improved after diuretics held for a short period. Elevated troponin thought to be due to demand ischemia. Discharged after 3 days. Admitted 06/28/17 due to chest pain and acute on chronic HF. Cardiology consult obtained. Elevated troponin thought to be due to demand ischemia. Initially needed IV lasix and then transitioned to oral diuretics. Brain MRI done due to frequent falls which was negative for a stroke. Discharged after 2 days with home health.   He presents today for a follow-up visit with a chief complaint of moderate fatigue upon minimal exertion. He describes this as chronic in nature having been present for several months. He has associated blurry vision, chest tightness, shortness of breath, dry cough, pedal edema and back pain along with this. He denies any difficulty sleeping, abdominal distention, palpitations, chest pain, dizziness or weight gain. Not weighing daily. Expresses frustration with heath and how much medication he is taking since he moved here from [place]. Tells the pharmacist that he occasionally doesn't take the medications like it is prescribed.   Past Medical History:  Diagnosis Date  . Chronic diastolic CHF (congestive heart failure) ([place])    a.  TTE 05/2015 EF > 55%  . Chronic kidney disease (CKD), stage III (moderate) (H[facility])   . DVT (deep venous thrombosis) ([place])   . GERD (gastroesophageal reflux disease)   . Hypercholesteremia   . Hypertension   . Insulin dependent diabetes mellitus ([place])   . MGUS (monoclonal gammopathy of unknown significance)   . Prostate CA ([place]) 2015   only surgery per pt   Past Surgical History:  Procedure Laterality Date  . PROSTATECTOMY     Family History  Problem Relation Age of Onset  . Diabetes Mother   . COPD Mother   . Cancer Sister   . Cancer Sister   . Dementia Sister    Social History   Tobacco Use  . Smoking status: Never Smoker  . Smokeless tobacco: Never Used  Substance Use Topics  . Alcohol use: No    Frequency: Never   No Known Allergies  Prior to Admission medications   Medication Sig Start Date End Date Taking? Authorizing Provider  aspirin EC 81 MG tablet Take 81 mg by mouth daily.    Yes [provider]  atorvastatin (LIPITOR) 20 MG tablet Take 20 mg by mouth daily.   Yes [provider]  carvedilol (COREG) 3.125 MG tablet Take 1 tablet (3.125 mg total) by mouth 2 (two) times daily. 09/22/17 10/22/17 Yes Pyreddy, Reatha Harps, MD  furosemide (LASIX) 20 MG tablet Take 1 tablet (20 mg total) by mouth daily. Patient taking differently: Take 20 mg by mouth daily.  09/22/17  Yes Pyreddy, Reatha Harps, MD  gabapentin (NEURONTIN) 100 MG capsule  Take 200 mg by mouth daily. 06/20/16  Yes [provider]  Insulin Syringe-Needle U-100 (INSULIN SYRINGE 1CC/30GX5/16") 30G X 5/16" 1 ML MISC Use 1 Dose 4 (four) times daily. 09/10/15  Yes [provider]  linaclotide Rolan Lipa) 290 MCG CAPS capsule Take 1 capsule (290 mcg total) by mouth daily before breakfast. 03/14/17 09/20/18 Yes Jonathon Bellows, MD  losartan (COZAAR) 25 MG tablet Take 25 mg by mouth daily.   Yes [provider]  ONETOUCH VERIO test strip CHECK DAILY 01/21/17  Yes [provider]  TRESIBA  FLEXTOUCH 100 UNIT/ML SOPN FlexTouch Pen INJECT 30UNITS WITH AUTO-INJECTOR ONCE DAILY if CBG > 150 mg/dl 02/14/17  Yes [provider]  VENTOLIN HFA 108 (90 Base) MCG/ACT inhaler INHALE 1 PUFF BY MOUTH 3 TIMES A DAY AS NEEDED 02/14/17  Yes [provider]    Review of Systems  Constitutional: Positive for appetite change (decreased) and fatigue (easily tired).  HENT: Negative for congestion, postnasal drip and sore throat.   Eyes: Positive for visual disturbance (blurry vision).  Respiratory: Positive for cough (dry), chest tightness and shortness of breath.   Cardiovascular: Positive for leg swelling. Negative for chest pain and palpitations.  Gastrointestinal: Negative for abdominal distention and abdominal pain.  Endocrine: Negative.   Genitourinary: Negative.   Musculoskeletal: Positive for back pain. Negative for arthralgias.  Skin: Negative.   Allergic/Immunologic: Negative.   Neurological: Negative for dizziness and light-headedness.  Hematological: Negative for adenopathy. Does not bruise/bleed easily.  Psychiatric/Behavioral: Negative for dysphoric mood and sleep disturbance (sleeping on 2 pillows). The patient is not nervous/anxious.    Vitals:   09/29/17 1000  BP: (!) 101/55  Pulse: 77  Resp: 18  SpO2: 100%  Weight: 192 lb 6 oz (87.3 kg)  Height: 5\' 8"  (1.727 m)   Wt Readings from Last 3 Encounters:  09/29/17 192 lb 6 oz (87.3 kg)  09/28/17 193 lb (87.5 kg)  09/22/17 188 lb 4.8 oz (85.4 kg)   Lab Results  Component Value Date   CREATININE 1.89 (H) 09/22/2017   CREATININE 2.10 (H) 09/21/2017   CREATININE 2.36 (H) 09/20/2017    Physical Exam  Constitutional: He appears well-developed and well-nourished.  HENT:  Head: Normocephalic and atraumatic.  Neck: Normal range of motion. Neck supple. No JVD present.  Cardiovascular: Normal rate and regular rhythm.  Pulmonary/Chest: Effort normal. No respiratory distress. He has no wheezes. He has no  rales.  Abdominal: Soft. He exhibits no distension.  Musculoskeletal: He exhibits edema (1+ pitting edema in bilateral lower legs). He exhibits no deformity.  Skin: Skin is warm and dry.  Psychiatric: He has a normal mood and affect. His behavior is normal. Thought content normal.  Nursing note and vitals reviewed.   Assessment & Plan:  1: Chronic heart failure with preserved ejection fraction- - NYHA III - mildly fluid overloaded today with edema; currently taking furosemide 1-2 times/ day - weighing but not daily; reminded to weigh daily and to call for an overnight weight gain of >2 pounds or a weekly weight gain of >5 pounds - weight stable from last time he was here - not adding salt but says that his food tastes terrible without salt. From [place] originally. Reminded about the importance of reading food labels and to closely follow a 2000mg  diet  - drinking ~ 36 ounces of fluid daily. Discussed increasing his daily fluid intake to closer to 60 ounces daily - BNP on 09/19/17 was 313.0 - saw EP Caryl Comes) 09/28/17 -  PharmD reconciled medications with the patient  2: HTN- - BP looks good today although on the low side which may limit diuretic usage - saw PCP (Masoud) 07/05/17 & returns today - BMP on 09/22/17 reviewed and showed sodium 141, potassium 4.1 and GFR 39  3: DM- - says that his fasting glucose at home this morning was 104 - A1c on 06/26/17 was 6.9%  4: Lymphedema- - stage 2 - elevating his legs but edema persists - does not like the compression socks - limited in his ability to exercise right now due to fatigue/ shortness of breath - discussed lymphapress compression boots with patient and his wife and they are interested in this. Will make a referral  Medication list was reviewed.  Return in 3 months or sooner for any questions/problems before then.

## 2017-09-29 NOTE — Patient Instructions (Signed)
Continue weighing daily and call for an overnight weight gain of > 2 pounds or a weekly weight gain of >5 pounds. 

## 2017-10-07 ENCOUNTER — Other Ambulatory Visit: Payer: Self-pay | Admitting: Gastroenterology

## 2017-10-07 DIAGNOSIS — K5900 Constipation, unspecified: Secondary | ICD-10-CM

## 2017-10-09 DIAGNOSIS — D631 Anemia in chronic kidney disease: Secondary | ICD-10-CM | POA: Diagnosis not present

## 2017-10-09 DIAGNOSIS — N183 Chronic kidney disease, stage 3 (moderate): Secondary | ICD-10-CM | POA: Diagnosis not present

## 2017-10-09 DIAGNOSIS — N2581 Secondary hyperparathyroidism of renal origin: Secondary | ICD-10-CM | POA: Diagnosis not present

## 2017-10-09 DIAGNOSIS — I10 Essential (primary) hypertension: Secondary | ICD-10-CM | POA: Diagnosis not present

## 2017-10-09 DIAGNOSIS — E1122 Type 2 diabetes mellitus with diabetic chronic kidney disease: Secondary | ICD-10-CM | POA: Diagnosis not present

## 2017-10-12 ENCOUNTER — Other Ambulatory Visit
Admission: RE | Admit: 2017-10-12 | Discharge: 2017-10-12 | Disposition: A | Discharge status: Home / Self Care | Payer: Medicare HMO | Source: Ambulatory Visit | Attending: Internal Medicine | Admitting: Internal Medicine

## 2017-10-12 DIAGNOSIS — R140 Abdominal distension (gaseous): Secondary | ICD-10-CM | POA: Diagnosis not present

## 2017-10-12 DIAGNOSIS — M5440 Lumbago with sciatica, unspecified side: Secondary | ICD-10-CM | POA: Diagnosis not present

## 2017-10-12 DIAGNOSIS — I509 Heart failure, unspecified: Secondary | ICD-10-CM | POA: Insufficient documentation

## 2017-10-12 DIAGNOSIS — N183 Chronic kidney disease, stage 3 (moderate): Secondary | ICD-10-CM | POA: Insufficient documentation

## 2017-10-12 DIAGNOSIS — I739 Peripheral vascular disease, unspecified: Secondary | ICD-10-CM | POA: Diagnosis not present

## 2017-10-12 DIAGNOSIS — C61 Malignant neoplasm of prostate: Secondary | ICD-10-CM | POA: Diagnosis not present

## 2017-10-12 LAB — COMPREHENSIVE METABOLIC PANEL
ALT: 17 U/L (ref 0–44)
AST: 14 U/L — ABNORMAL LOW (ref 15–41)
Albumin: 3.6 g/dL (ref 3.5–5.0)
Alkaline Phosphatase: 102 U/L (ref 38–126)
Anion gap: 6 (ref 5–15)
BUN: 26 mg/dL — ABNORMAL HIGH (ref 8–23)
CO2: 27 mmol/L (ref 22–32)
Calcium: 9.3 mg/dL (ref 8.9–10.3)
Chloride: 107 mmol/L (ref 98–111)
Creatinine, Ser: 1.67 mg/dL — ABNORMAL HIGH (ref 0.61–1.24)
GFR calc Af Amer: 45 mL/min — ABNORMAL LOW (ref >60)
GFR calc non Af Amer: 39 mL/min — ABNORMAL LOW (ref >60)
Glucose, Bld: 167 mg/dL — ABNORMAL HIGH (ref 70–99)
Potassium: 5.2 mmol/L — ABNORMAL HIGH (ref 3.5–5.1)
Sodium: 140 mmol/L (ref 135–145)
Total Bilirubin: 0.9 mg/dL (ref 0.3–1.2)
Total Protein: 7 g/dL (ref 6.5–8.1)

## 2017-10-12 LAB — CBC
HCT: 28.6 % — ABNORMAL LOW (ref 40.0–52.0)
Hemoglobin: 9.4 g/dL — ABNORMAL LOW (ref 13.0–18.0)
MCH: 29.9 pg (ref 26.0–34.0)
MCHC: 32.9 g/dL (ref 32.0–36.0)
MCV: 90.8 fL (ref 80.0–100.0)
Platelets: 227 10*3/uL (ref 150–440)
RBC: 3.15 MIL/uL — ABNORMAL LOW (ref 4.40–5.90)
RDW: 13.9 % (ref 11.5–14.5)
WBC: 5.9 10*3/uL (ref 3.8–10.6)

## 2017-10-16 ENCOUNTER — Ambulatory Visit: Payer: Medicare HMO | Admitting: Family

## 2017-10-19 DIAGNOSIS — R140 Abdominal distension (gaseous): Secondary | ICD-10-CM | POA: Diagnosis not present

## 2017-10-19 DIAGNOSIS — C61 Malignant neoplasm of prostate: Secondary | ICD-10-CM | POA: Diagnosis not present

## 2017-10-19 DIAGNOSIS — M5440 Lumbago with sciatica, unspecified side: Secondary | ICD-10-CM | POA: Diagnosis not present

## 2017-10-19 DIAGNOSIS — I509 Heart failure, unspecified: Secondary | ICD-10-CM | POA: Diagnosis not present

## 2017-11-01 DIAGNOSIS — R69 Illness, unspecified: Secondary | ICD-10-CM | POA: Diagnosis not present

## 2017-11-03 ENCOUNTER — Telehealth: Payer: Self-pay | Admitting: Internal Medicine

## 2017-11-03 NOTE — Telephone Encounter (Signed)
Scheduled 9/5 with Caryl Comes

## 2017-11-03 NOTE — Telephone Encounter (Signed)
Pt c/o Shortness Of Breath: STAT if SOB developed within the last 24 hours or pt is noticeably SOB on the phone  1. Are you currently SOB (can you hear that pt is SOB on the phone)? No   2. How long have you been experiencing SOB?  Not sure comes and goes   3. Are you SOB when sitting or when up moving around? Mostly  moving around   4. Are you currently experiencing any other symptoms?  Swelling in feet and fullness in abdomen patient says he weighs every morning denies weight gain

## 2017-11-03 NOTE — Telephone Encounter (Signed)
S/w patient. Having some shortness of breath with activity. He denies any weight gain. Complains of swelling in feet and fullness in abdomen. Denies chest pain. Advised patient to take furosemide two times a day today and tomorrow and scheduled him to see Thurmond Butts on 11/07/17. No sooner appointment with Otila Kluver in heart failure clinic next week. Routing to Ignacia Bayley, NP for review.

## 2017-11-03 NOTE — Telephone Encounter (Signed)
Spoke to patient and cancelled upcoming appointment  He was seen in July and it said PRN   He was thankful for the call   Nothing else needed.

## 2017-11-03 NOTE — Telephone Encounter (Signed)
-----   Message from Britt Bottom, [place] sent at 11/01/2017  8:39 AM EDT ----- Please advise if pt should f/u pt had last OV 09/2017 told to f/u prn. Pt f/u prior to ED visit.  Pt was seen before recommended appointment f/u.   Thank you, Lenda Kelp

## 2017-11-07 ENCOUNTER — Ambulatory Visit: Payer: Medicare HMO | Admitting: Physician Assistant

## 2017-11-09 ENCOUNTER — Ambulatory Visit: Payer: Medicare HMO | Admitting: Internal Medicine

## 2017-11-10 ENCOUNTER — Encounter: Payer: Self-pay | Admitting: Internal Medicine

## 2017-11-16 ENCOUNTER — Encounter: Payer: Self-pay | Admitting: Internal Medicine

## 2017-11-16 ENCOUNTER — Ambulatory Visit (INDEPENDENT_AMBULATORY_CARE_PROVIDER_SITE_OTHER): Payer: Medicare HMO | Admitting: Internal Medicine

## 2017-11-16 VITALS — BP 110/60 | HR 86 | Ht 68.0 in | Wt 189.5 lb

## 2017-11-16 DIAGNOSIS — R55 Syncope and collapse: Secondary | ICD-10-CM | POA: Diagnosis not present

## 2017-11-16 DIAGNOSIS — I5032 Chronic diastolic (congestive) heart failure: Secondary | ICD-10-CM | POA: Diagnosis not present

## 2017-11-16 DIAGNOSIS — I10 Essential (primary) hypertension: Secondary | ICD-10-CM

## 2017-11-16 DIAGNOSIS — R0602 Shortness of breath: Secondary | ICD-10-CM | POA: Diagnosis not present

## 2017-11-16 MED ORDER — CARVEDILOL 3.125 MG PO TABS: 3.1250 mg | ORAL_TABLET | Freq: Two times a day (BID) | ORAL | 0 refills | Status: AC

## 2017-11-16 MED ORDER — VENTOLIN HFA 108 (90 BASE) MCG/ACT IN AERS: INHALATION_SPRAY | RESPIRATORY_TRACT | 0 refills | Status: AC

## 2017-11-16 NOTE — Patient Instructions (Addendum)
Medication Instructions: - Your physician has recommended you make the following change in your medication:   1) Change lasix (furosemide) 40 mg- take 2 tablets (80 mg) by mouth once every other day  Labwork: - none ordered  Procedures/Testing: - none ordered  Follow-Up: - Dr. Caryl Comes will see you back on an as needed basis  - please follow up with Dr. Lavera Guise  Any Additional Special Instructions Will Be Listed Below (If Applicable).     If you need a refill on your cardiac medications before your next appointment, please call your pharmacy.

## 2017-11-16 NOTE — Progress Notes (Signed)
Patient Care Team: Cletis Athens, MD as PCP - General (Internal Medicine) End, Harrell Gave, MD as PCP - Cardiology (Cardiology)   HPI  Jesse Dickson is a 77 y.o. male Seen following a recent hospitalization for acute on chronic HFpEF.  I had seen him earlier this spring for syncope associated with PP and PR prolongation in the context of urinating.   DATE TEST EF   3/17 Echo   55 %   4/19 Echo   60-65 % MR mild-mod  4/19 Myoview 51% No Ischemia  7/19 Echo  55-60% LVH-mod     He has had problems w volume overload and prob sleep apnea  He was to followup w Dr Lavera Guise  He has little energy and significant shortness of breath as well as lassitude and gross indifference to life.  His wife (and he) acknowledge the possibility of depression.    There are a great number of stressful social and financial issues   Date Cr K Hgb  4//19 2.02 4.3 8.2 Chronic   7/19 2.36>>1.89 4.1 8.6           Past Medical History:  Diagnosis Date  . Chronic diastolic CHF (congestive heart failure) ([place])    a. TTE 05/2015 EF > 55%  . Chronic kidney disease (CKD), stage III (moderate) ([facility])   . DVT (deep venous thrombosis) ([place])   . GERD (gastroesophageal reflux disease)   . Hypercholesteremia   . Hypertension   . Insulin dependent diabetes mellitus ([place])   . MGUS (monoclonal gammopathy of unknown significance)   . Prostate CA ([place]) 2015   only surgery per pt    Past Surgical History:  Procedure Laterality Date  . PROSTATECTOMY      Current Meds  Medication Sig  . aspirin EC 81 MG tablet Take 81 mg by mouth daily.   Marland Kitchen atorvastatin (LIPITOR) 20 MG tablet Take 20 mg by mouth daily.  . carvedilol (COREG) 3.125 MG tablet Take 1 tablet (3.125 mg total) by mouth 2 (two) times daily.  . furosemide (LASIX) 40 MG tablet Take 2 tablets (80 mg) by mouth once every other day  . gabapentin (NEURONTIN) 100 MG capsule Take 200 mg by mouth daily.  . Insulin Syringe-Needle U-100  (INSULIN SYRINGE 1CC/30GX5/16") 30G X 5/16" 1 ML MISC Use 1 Dose 4 (four) times daily.  Marland Kitchen linaclotide (LINZESS) 290 MCG CAPS capsule Take 1 capsule (290 mcg total) by mouth daily before breakfast.  . losartan (COZAAR) 25 MG tablet Take 25 mg by mouth daily.  Marland Kitchen omeprazole (PRILOSEC) 20 MG capsule Take 20 mg by mouth daily.  Glory Rosebush VERIO test strip CHECK DAILY  . TRESIBA FLEXTOUCH 100 UNIT/ML SOPN FlexTouch Pen INJECT 30UNITS WITH AUTO-INJECTOR ONCE DAILY if CBG > 150 mg/dl  . VENTOLIN HFA 108 (90 Base) MCG/ACT inhaler INHALE 1 PUFF BY MOUTH 3 TIMES A DAY AS NEEDED  . [DISCONTINUED] carvedilol (COREG) 3.125 MG tablet Take 1 tablet (3.125 mg total) by mouth 2 (two) times daily.  . [DISCONTINUED] VENTOLIN HFA 108 (90 Base) MCG/ACT inhaler INHALE 1 PUFF BY MOUTH 3 TIMES A DAY AS NEEDED    No Known Allergies    Review of Systems negative except from HPI and PMH  Physical Exam BP 110/60 (BP Location: Left Arm, Patient Position: Sitting, Cuff Size: Normal)   Pulse 86   Ht 5\' 8"  (1.727 m)   Wt 189 lb 8 oz (86 kg)   BMI 28.81 kg/m  Well developed and  nourished in no acute distress HENT normal Neck supple with JVP-flat Clear Regular rate and rhythm, no murmurs or gallops Abd-soft with active BS No Clubbing cyanosis tredema Skin-warm and dry A & Oriented  Grossly normal sensory and motor function       Assessment and Plan:  Syncope associated with PR and PP prolongation and a sinus pause  Orthostatic intolerance  HFpEF  Diabetes with diabetic neuropathy  Grade 3/4 chronic kidney disease  Anemia  Left ventricular hypertrophy--MR-mild/moderate  Sleep disordered breathing and daytime somnolence  Euvolemic, continue current meds  Hgb  A little better-- needs to be followed up by his PCP  Also encouraged followup with Dr Velora Mediate re sleep study and consideration of antidepressant therapy  More than 50% of 40 min was spent in counseling related to the  above      Current medicines are reviewed at length with the patient today .  The patient does not  have concerns regarding medicines.

## 2017-11-17 DIAGNOSIS — I890 Lymphedema, not elsewhere classified: Secondary | ICD-10-CM | POA: Diagnosis not present

## 2017-11-23 DIAGNOSIS — R69 Illness, unspecified: Secondary | ICD-10-CM | POA: Diagnosis not present

## 2017-11-28 DIAGNOSIS — Z23 Encounter for immunization: Secondary | ICD-10-CM | POA: Diagnosis not present

## 2017-11-28 DIAGNOSIS — N183 Chronic kidney disease, stage 3 (moderate): Secondary | ICD-10-CM | POA: Diagnosis not present

## 2017-11-28 DIAGNOSIS — E785 Hyperlipidemia, unspecified: Secondary | ICD-10-CM | POA: Diagnosis not present

## 2017-11-28 DIAGNOSIS — D631 Anemia in chronic kidney disease: Secondary | ICD-10-CM | POA: Diagnosis not present

## 2017-11-28 DIAGNOSIS — R140 Abdominal distension (gaseous): Secondary | ICD-10-CM | POA: Diagnosis not present

## 2017-11-30 ENCOUNTER — Inpatient Hospital Stay: Payer: Medicare HMO

## 2017-11-30 ENCOUNTER — Inpatient Hospital Stay: Payer: Medicare HMO | Admitting: Oncology

## 2017-12-12 DIAGNOSIS — E109 Type 1 diabetes mellitus without complications: Secondary | ICD-10-CM | POA: Diagnosis not present

## 2017-12-12 DIAGNOSIS — I10 Essential (primary) hypertension: Secondary | ICD-10-CM | POA: Diagnosis not present

## 2017-12-12 DIAGNOSIS — Z0100 Encounter for examination of eyes and vision without abnormal findings: Secondary | ICD-10-CM | POA: Diagnosis not present

## 2017-12-12 DIAGNOSIS — H524 Presbyopia: Secondary | ICD-10-CM | POA: Diagnosis not present

## 2017-12-12 DIAGNOSIS — E1165 Type 2 diabetes mellitus with hyperglycemia: Secondary | ICD-10-CM | POA: Diagnosis not present

## 2017-12-17 DIAGNOSIS — I890 Lymphedema, not elsewhere classified: Secondary | ICD-10-CM | POA: Diagnosis not present

## 2017-12-21 DIAGNOSIS — R69 Illness, unspecified: Secondary | ICD-10-CM | POA: Diagnosis not present

## 2017-12-26 ENCOUNTER — Other Ambulatory Visit: Payer: Self-pay | Admitting: Internal Medicine

## 2017-12-26 ENCOUNTER — Ambulatory Visit: Payer: Medicare HMO | Attending: Family | Admitting: Family

## 2017-12-26 ENCOUNTER — Encounter: Payer: Self-pay | Admitting: Family

## 2017-12-26 VITALS — BP 117/59 | HR 84 | Resp 18 | Ht 68.0 in | Wt 188.2 lb

## 2017-12-26 DIAGNOSIS — IMO0001 Reserved for inherently not codable concepts without codable children: Secondary | ICD-10-CM

## 2017-12-26 DIAGNOSIS — Z833 Family history of diabetes mellitus: Secondary | ICD-10-CM | POA: Insufficient documentation

## 2017-12-26 DIAGNOSIS — I130 Hypertensive heart and chronic kidney disease with heart failure and stage 1 through stage 4 chronic kidney disease, or unspecified chronic kidney disease: Secondary | ICD-10-CM | POA: Insufficient documentation

## 2017-12-26 DIAGNOSIS — N183 Chronic kidney disease, stage 3 (moderate): Secondary | ICD-10-CM | POA: Diagnosis not present

## 2017-12-26 DIAGNOSIS — I509 Heart failure, unspecified: Secondary | ICD-10-CM | POA: Diagnosis present

## 2017-12-26 DIAGNOSIS — Z86718 Personal history of other venous thrombosis and embolism: Secondary | ICD-10-CM | POA: Diagnosis not present

## 2017-12-26 DIAGNOSIS — Z794 Long term (current) use of insulin: Secondary | ICD-10-CM | POA: Diagnosis not present

## 2017-12-26 DIAGNOSIS — D472 Monoclonal gammopathy: Secondary | ICD-10-CM | POA: Insufficient documentation

## 2017-12-26 DIAGNOSIS — E1122 Type 2 diabetes mellitus with diabetic chronic kidney disease: Secondary | ICD-10-CM | POA: Diagnosis not present

## 2017-12-26 DIAGNOSIS — Z8546 Personal history of malignant neoplasm of prostate: Secondary | ICD-10-CM | POA: Diagnosis not present

## 2017-12-26 DIAGNOSIS — Z7982 Long term (current) use of aspirin: Secondary | ICD-10-CM | POA: Diagnosis not present

## 2017-12-26 DIAGNOSIS — I5032 Chronic diastolic (congestive) heart failure: Secondary | ICD-10-CM | POA: Diagnosis not present

## 2017-12-26 DIAGNOSIS — Z825 Family history of asthma and other chronic lower respiratory diseases: Secondary | ICD-10-CM | POA: Diagnosis not present

## 2017-12-26 DIAGNOSIS — K219 Gastro-esophageal reflux disease without esophagitis: Secondary | ICD-10-CM | POA: Insufficient documentation

## 2017-12-26 DIAGNOSIS — E119 Type 2 diabetes mellitus without complications: Secondary | ICD-10-CM

## 2017-12-26 DIAGNOSIS — E7800 Pure hypercholesterolemia, unspecified: Secondary | ICD-10-CM | POA: Insufficient documentation

## 2017-12-26 DIAGNOSIS — I890 Lymphedema, not elsewhere classified: Secondary | ICD-10-CM | POA: Diagnosis not present

## 2017-12-26 DIAGNOSIS — I10 Essential (primary) hypertension: Secondary | ICD-10-CM

## 2017-12-26 DIAGNOSIS — Z79899 Other long term (current) drug therapy: Secondary | ICD-10-CM | POA: Diagnosis not present

## 2017-12-26 NOTE — Progress Notes (Signed)
Patient ID: Jesse Dickson, male    DOB: 08-27-1940, 77 y.o.   MRN: [id number]  HPI  Mr. Spieker is a 77 y/o male with a history of prostate cancer, DM, hyperlipidemia, HTN, CKD, DVT, GERD and chronic heart failure.   Echo report from 09/19/17 reviewed and showed an EF of 55-60% along with mild MR and mildly increased PA pressure of 35-40 mmHg. Echo report from 06/28/17 reviewed and showed an EF of 60-65% along with mild/mod MR and a mildly elevated PA pressure of 36 mm Hg.   Admitted 09/19/17 due to acute on chronic HF. Initially needed bipap and then onto room air. Needed IV lasix and then transitioned back to oral diuretics. Cardiology consult obtained. Nephrology consult also obtained due to worsening renal function which improved after diuretics held for a short period. Elevated troponin thought to be due to demand ischemia. Discharged after 3 days. Admitted 06/28/17 due to chest pain and acute on chronic HF. Cardiology consult obtained. Elevated troponin thought to be due to demand ischemia. Initially needed IV lasix and then transitioned to oral diuretics. Brain MRI done due to frequent falls which was negative for a stroke. Discharged after 2 days with home health.   He presents today for a follow-up visit with a chief complaint of minimal fatigue upon moderate exertion. He describes this as chronic in nature having been present for several months. He has associated chest tightness, cough, shortness of breath and back pain along with this. He denies any difficulty sleeping, abdominal distention, palpitations, pedal edema, chest pain, dizziness or weight gain.   Past Medical History:  Diagnosis Date  . Chronic diastolic CHF (congestive heart failure) ([place])    a. TTE 05/2015 EF > 55%  . Chronic kidney disease (CKD), stage III (moderate) (HCC)   . DVT (deep venous thrombosis) ([place])   . GERD (gastroesophageal reflux disease)   . Hypercholesteremia   . Hypertension   . Insulin dependent  diabetes mellitus ([place])   . MGUS (monoclonal gammopathy of unknown significance)   . Prostate CA ([place]) 2015   only surgery per pt   Past Surgical History:  Procedure Laterality Date  . PROSTATECTOMY     Family History  Problem Relation Age of Onset  . Diabetes Mother   . COPD Mother   . Cancer Sister   . Cancer Sister   . Dementia Sister    Social History   Tobacco Use  . Smoking status: Never Smoker  . Smokeless tobacco: Never Used  Substance Use Topics  . Alcohol use: No    Frequency: Never   No Known Allergies  Prior to Admission medications   Medication Sig Start Date End Date Taking? Authorizing Provider  aspirin EC 81 MG tablet Take 81 mg by mouth daily.    Yes [provider]  atorvastatin (LIPITOR) 20 MG tablet Take 20 mg by mouth daily.   Yes [provider]  carvedilol (COREG) 3.125 MG tablet Take 1 tablet (3.125 mg total) by mouth 2 (two) times daily. 11/16/17 12/27/18 Yes Deboraha Sprang, MD  citalopram (CELEXA) 10 MG tablet Take 10 mg by mouth daily.   Yes [provider]  furosemide (LASIX) 40 MG tablet 40 mg 2 (two) times daily.    Yes [provider]  gabapentin (NEURONTIN) 100 MG capsule Take 200 mg by mouth daily. 06/20/16  Yes [provider]  linaclotide (LINZESS) 290 MCG CAPS capsule Take 1 capsule (290 mcg total) by mouth daily before breakfast.  03/14/17 09/20/18 Yes Jonathon Bellows, MD  losartan (COZAAR) 25 MG tablet Take 25 mg by mouth daily.   Yes [provider]  omeprazole (PRILOSEC) 20 MG capsule Take 20 mg by mouth daily.   Yes [provider]  ONETOUCH VERIO test strip CHECK DAILY 01/21/17  Yes [provider]  TRESIBA FLEXTOUCH 100 UNIT/ML SOPN FlexTouch Pen INJECT 30UNITS WITH AUTO-INJECTOR ONCE DAILY if CBG > 150 mg/dl 02/14/17  Yes [provider]  VENTOLIN HFA 108 (90 Base) MCG/ACT inhaler INHALE 1 PUFF BY MOUTH 3 TIMES A DAY AS NEEDED 11/16/17  Yes Deboraha Sprang, MD   Insulin Syringe-Needle U-100 (INSULIN SYRINGE 1CC/30GX5/16") 30G X 5/16" 1 ML MISC Use 1 Dose 4 (four) times daily. 09/10/15   [provider]    Review of Systems  Constitutional: Positive for appetite change (decreased) and fatigue (with moderate exertion).  HENT: Negative for congestion, postnasal drip and sore throat.   Eyes: Positive for visual disturbance (blurry vision).  Respiratory: Positive for cough (dry), chest tightness and shortness of breath.   Cardiovascular: Negative for chest pain, palpitations and leg swelling.  Gastrointestinal: Negative for abdominal distention and abdominal pain.  Endocrine: Negative.   Genitourinary: Negative.   Musculoskeletal: Positive for back pain. Negative for arthralgias.  Skin: Negative.   Allergic/Immunologic: Negative.   Neurological: Negative for dizziness and light-headedness.  Hematological: Negative for adenopathy. Does not bruise/bleed easily.  Psychiatric/Behavioral: Negative for dysphoric mood and sleep disturbance (sleeping on 2 pillows). The patient is not nervous/anxious.    Vitals:   12/26/17 1011  BP: (!) 117/59  Pulse: 84  Resp: 18  SpO2: 98%  Weight: 188 lb 4 oz (85.4 kg)  Height: 5\' 8"  (1.727 m)   Wt Readings from Last 3 Encounters:  12/26/17 188 lb 4 oz (85.4 kg)  11/16/17 189 lb 8 oz (86 kg)  09/29/17 192 lb 6 oz (87.3 kg)   Lab Results  Component Value Date   CREATININE 1.67 (H) 10/12/2017   CREATININE 1.89 (H) 09/22/2017   CREATININE 2.10 (H) 09/21/2017    Physical Exam  Constitutional: He appears well-developed and well-nourished.  HENT:  Head: Normocephalic and atraumatic.  Neck: Normal range of motion. Neck supple. No JVD present.  Cardiovascular: Normal rate and regular rhythm.  Pulmonary/Chest: Effort normal. No respiratory distress. He has no wheezes. He has no rales.  Abdominal: Soft. He exhibits no distension.  Musculoskeletal:       Right lower leg: He exhibits no tenderness and no  edema.       Left lower leg: He exhibits no tenderness and no edema.  Skin: Skin is warm and dry.  Psychiatric: He has a normal mood and affect. His behavior is normal. Thought content normal.  Nursing note and vitals reviewed.   Assessment & Plan:  1: Chronic heart failure with preserved ejection fraction- - NYHA II - euvolemic today - weighing but not daily; reminded to weigh daily and to call for an overnight weight gain of >2 pounds or a weekly weight gain of >5 pounds - weight down 4 pounds since his last visit here 3 months ago - not adding salt but says that his food tastes terrible without salt. From [place] originally. Reminded about the importance of reading food labels and to closely follow a 2000mg  diet  - drinking ~ 36 ounces of fluid daily. Discussed increasing his daily fluid intake to closer to 60 ounces daily - BNP on 09/19/17 was 313.0 - saw EP Caryl Comes)  11/16/17 due to previous syncope due to PR interval - PharmD reconciled medications with the patient; patient's wife says that he takes his medications "when and how he wants to"; PharmD reviewed the importance of taking all his medications as prescribed - he says that he's received his flu vaccine for this season  2: HTN- - BP looks good today - saw PCP (Masoud) 07/05/17 & returns next week  - BMP on 10/12/17 reviewed and showed sodium 140, potassium 5.2, creatinine 1.67  and GFR 45  3: DM- - says that his fasting glucose at home this morning was 135 - A1c on 06/26/17 was 6.9%  4: Lymphedema- - stage 2 - tries to wear compression boots at least once daily; does elevate his legs when wearing them - resolved  Medication bottles were reviewed.  Will not make a return appointment for him at this time. Advised patient and wife that they could call back at anytime to make another appointment.

## 2017-12-26 NOTE — Patient Instructions (Signed)
Continue weighing daily and call for an overnight weight gain of > 2 pounds or a weekly weight gain of >5 pounds. 

## 2018-01-01 DIAGNOSIS — N183 Chronic kidney disease, stage 3 (moderate): Secondary | ICD-10-CM | POA: Diagnosis not present

## 2018-01-01 DIAGNOSIS — R140 Abdominal distension (gaseous): Secondary | ICD-10-CM | POA: Diagnosis not present

## 2018-01-01 DIAGNOSIS — D631 Anemia in chronic kidney disease: Secondary | ICD-10-CM | POA: Diagnosis not present

## 2018-01-01 DIAGNOSIS — E785 Hyperlipidemia, unspecified: Secondary | ICD-10-CM | POA: Diagnosis not present

## 2018-01-14 DIAGNOSIS — R69 Illness, unspecified: Secondary | ICD-10-CM | POA: Diagnosis not present

## 2018-01-17 DIAGNOSIS — I890 Lymphedema, not elsewhere classified: Secondary | ICD-10-CM | POA: Diagnosis not present

## 2018-02-05 DIAGNOSIS — R69 Illness, unspecified: Secondary | ICD-10-CM | POA: Diagnosis not present

## 2018-02-14 DIAGNOSIS — E119 Type 2 diabetes mellitus without complications: Secondary | ICD-10-CM | POA: Diagnosis not present

## 2018-02-14 DIAGNOSIS — D631 Anemia in chronic kidney disease: Secondary | ICD-10-CM | POA: Diagnosis not present

## 2018-02-14 DIAGNOSIS — N183 Chronic kidney disease, stage 3 (moderate): Secondary | ICD-10-CM | POA: Diagnosis not present

## 2018-02-14 DIAGNOSIS — C61 Malignant neoplasm of prostate: Secondary | ICD-10-CM | POA: Diagnosis not present

## 2018-02-16 DIAGNOSIS — I890 Lymphedema, not elsewhere classified: Secondary | ICD-10-CM | POA: Diagnosis not present

## 2018-02-17 DIAGNOSIS — R69 Illness, unspecified: Secondary | ICD-10-CM | POA: Diagnosis not present

## 2018-02-20 DIAGNOSIS — I10 Essential (primary) hypertension: Secondary | ICD-10-CM | POA: Diagnosis not present

## 2018-02-20 DIAGNOSIS — E039 Hypothyroidism, unspecified: Secondary | ICD-10-CM | POA: Diagnosis not present

## 2018-02-20 DIAGNOSIS — R69 Illness, unspecified: Secondary | ICD-10-CM | POA: Diagnosis not present

## 2018-03-04 DIAGNOSIS — R69 Illness, unspecified: Secondary | ICD-10-CM | POA: Diagnosis not present

## 2018-03-15 DIAGNOSIS — R140 Abdominal distension (gaseous): Secondary | ICD-10-CM | POA: Diagnosis not present

## 2018-03-15 DIAGNOSIS — E119 Type 2 diabetes mellitus without complications: Secondary | ICD-10-CM | POA: Diagnosis not present

## 2018-03-15 DIAGNOSIS — M5440 Lumbago with sciatica, unspecified side: Secondary | ICD-10-CM | POA: Diagnosis not present

## 2018-03-15 DIAGNOSIS — R0602 Shortness of breath: Secondary | ICD-10-CM | POA: Diagnosis not present

## 2018-03-19 DIAGNOSIS — I890 Lymphedema, not elsewhere classified: Secondary | ICD-10-CM | POA: Diagnosis not present

## 2018-03-26 DIAGNOSIS — R69 Illness, unspecified: Secondary | ICD-10-CM | POA: Diagnosis not present

## 2018-03-28 DIAGNOSIS — R5381 Other malaise: Secondary | ICD-10-CM | POA: Diagnosis not present

## 2018-03-28 DIAGNOSIS — D631 Anemia in chronic kidney disease: Secondary | ICD-10-CM | POA: Diagnosis not present

## 2018-03-28 DIAGNOSIS — E119 Type 2 diabetes mellitus without complications: Secondary | ICD-10-CM | POA: Diagnosis not present

## 2018-03-28 DIAGNOSIS — I10 Essential (primary) hypertension: Secondary | ICD-10-CM | POA: Diagnosis not present

## 2018-03-28 DIAGNOSIS — N183 Chronic kidney disease, stage 3 (moderate): Secondary | ICD-10-CM | POA: Diagnosis not present

## 2018-03-28 DIAGNOSIS — R0602 Shortness of breath: Secondary | ICD-10-CM | POA: Diagnosis not present

## 2018-03-28 DIAGNOSIS — Z125 Encounter for screening for malignant neoplasm of prostate: Secondary | ICD-10-CM | POA: Diagnosis not present

## 2018-03-28 DIAGNOSIS — E7849 Other hyperlipidemia: Secondary | ICD-10-CM | POA: Diagnosis not present

## 2018-03-28 DIAGNOSIS — I509 Heart failure, unspecified: Secondary | ICD-10-CM | POA: Diagnosis not present

## 2018-03-28 DIAGNOSIS — R69 Illness, unspecified: Secondary | ICD-10-CM | POA: Diagnosis not present

## 2018-04-02 DIAGNOSIS — E119 Type 2 diabetes mellitus without complications: Secondary | ICD-10-CM | POA: Diagnosis not present

## 2018-04-02 DIAGNOSIS — I509 Heart failure, unspecified: Secondary | ICD-10-CM | POA: Diagnosis not present

## 2018-04-02 DIAGNOSIS — N183 Chronic kidney disease, stage 3 (moderate): Secondary | ICD-10-CM | POA: Diagnosis not present

## 2018-04-02 DIAGNOSIS — D631 Anemia in chronic kidney disease: Secondary | ICD-10-CM | POA: Diagnosis not present

## 2018-04-07 ENCOUNTER — Inpatient Hospital Stay
Admission: EM | Admit: 2018-04-07 | Discharge: 2018-04-13 | DRG: 683 | Disposition: A | Discharge status: Home Health | Payer: Medicare HMO | Attending: Internal Medicine | Admitting: Internal Medicine

## 2018-04-07 ENCOUNTER — Encounter: Payer: Self-pay | Admitting: Emergency Medicine

## 2018-04-07 ENCOUNTER — Emergency Department: Payer: Medicare HMO

## 2018-04-07 ENCOUNTER — Other Ambulatory Visit: Payer: Self-pay

## 2018-04-07 DIAGNOSIS — J9811 Atelectasis: Secondary | ICD-10-CM | POA: Diagnosis present

## 2018-04-07 DIAGNOSIS — N390 Urinary tract infection, site not specified: Secondary | ICD-10-CM | POA: Diagnosis not present

## 2018-04-07 DIAGNOSIS — N183 Chronic kidney disease, stage 3 (moderate): Secondary | ICD-10-CM | POA: Diagnosis not present

## 2018-04-07 DIAGNOSIS — D631 Anemia in chronic kidney disease: Secondary | ICD-10-CM | POA: Diagnosis present

## 2018-04-07 DIAGNOSIS — E860 Dehydration: Secondary | ICD-10-CM | POA: Diagnosis present

## 2018-04-07 DIAGNOSIS — N451 Epididymitis: Secondary | ICD-10-CM | POA: Diagnosis present

## 2018-04-07 DIAGNOSIS — E119 Type 2 diabetes mellitus without complications: Secondary | ICD-10-CM | POA: Diagnosis not present

## 2018-04-07 DIAGNOSIS — Z794 Long term (current) use of insulin: Secondary | ICD-10-CM

## 2018-04-07 DIAGNOSIS — Z825 Family history of asthma and other chronic lower respiratory diseases: Secondary | ICD-10-CM

## 2018-04-07 DIAGNOSIS — J209 Acute bronchitis, unspecified: Secondary | ICD-10-CM | POA: Diagnosis not present

## 2018-04-07 DIAGNOSIS — Z8546 Personal history of malignant neoplasm of prostate: Secondary | ICD-10-CM

## 2018-04-07 DIAGNOSIS — N189 Chronic kidney disease, unspecified: Secondary | ICD-10-CM | POA: Diagnosis present

## 2018-04-07 DIAGNOSIS — R1310 Dysphagia, unspecified: Secondary | ICD-10-CM | POA: Diagnosis present

## 2018-04-07 DIAGNOSIS — J918 Pleural effusion in other conditions classified elsewhere: Secondary | ICD-10-CM | POA: Diagnosis present

## 2018-04-07 DIAGNOSIS — E785 Hyperlipidemia, unspecified: Secondary | ICD-10-CM | POA: Diagnosis present

## 2018-04-07 DIAGNOSIS — R9431 Abnormal electrocardiogram [ECG] [EKG]: Secondary | ICD-10-CM | POA: Diagnosis not present

## 2018-04-07 DIAGNOSIS — R197 Diarrhea, unspecified: Secondary | ICD-10-CM | POA: Diagnosis present

## 2018-04-07 DIAGNOSIS — R630 Anorexia: Secondary | ICD-10-CM | POA: Diagnosis present

## 2018-04-07 DIAGNOSIS — I5032 Chronic diastolic (congestive) heart failure: Secondary | ICD-10-CM | POA: Diagnosis not present

## 2018-04-07 DIAGNOSIS — K439 Ventral hernia without obstruction or gangrene: Secondary | ICD-10-CM | POA: Diagnosis not present

## 2018-04-07 DIAGNOSIS — M5431 Sciatica, right side: Secondary | ICD-10-CM | POA: Diagnosis present

## 2018-04-07 DIAGNOSIS — N3000 Acute cystitis without hematuria: Secondary | ICD-10-CM | POA: Diagnosis present

## 2018-04-07 DIAGNOSIS — E875 Hyperkalemia: Secondary | ICD-10-CM | POA: Diagnosis present

## 2018-04-07 DIAGNOSIS — R109 Unspecified abdominal pain: Secondary | ICD-10-CM

## 2018-04-07 DIAGNOSIS — E1122 Type 2 diabetes mellitus with diabetic chronic kidney disease: Secondary | ICD-10-CM | POA: Diagnosis not present

## 2018-04-07 DIAGNOSIS — Z9889 Other specified postprocedural states: Secondary | ICD-10-CM

## 2018-04-07 DIAGNOSIS — Z833 Family history of diabetes mellitus: Secondary | ICD-10-CM | POA: Diagnosis not present

## 2018-04-07 DIAGNOSIS — N179 Acute kidney failure, unspecified: Secondary | ICD-10-CM | POA: Diagnosis not present

## 2018-04-07 DIAGNOSIS — J90 Pleural effusion, not elsewhere classified: Secondary | ICD-10-CM | POA: Diagnosis not present

## 2018-04-07 DIAGNOSIS — R809 Proteinuria, unspecified: Secondary | ICD-10-CM | POA: Diagnosis not present

## 2018-04-07 DIAGNOSIS — R1032 Left lower quadrant pain: Secondary | ICD-10-CM | POA: Diagnosis not present

## 2018-04-07 DIAGNOSIS — E7800 Pure hypercholesterolemia, unspecified: Secondary | ICD-10-CM | POA: Diagnosis present

## 2018-04-07 DIAGNOSIS — F329 Major depressive disorder, single episode, unspecified: Secondary | ICD-10-CM | POA: Diagnosis present

## 2018-04-07 DIAGNOSIS — Z7982 Long term (current) use of aspirin: Secondary | ICD-10-CM

## 2018-04-07 DIAGNOSIS — K219 Gastro-esophageal reflux disease without esophagitis: Secondary | ICD-10-CM | POA: Diagnosis present

## 2018-04-07 DIAGNOSIS — M545 Low back pain: Secondary | ICD-10-CM | POA: Diagnosis not present

## 2018-04-07 DIAGNOSIS — I10 Essential (primary) hypertension: Secondary | ICD-10-CM | POA: Diagnosis not present

## 2018-04-07 DIAGNOSIS — R600 Localized edema: Secondary | ICD-10-CM | POA: Diagnosis not present

## 2018-04-07 DIAGNOSIS — I130 Hypertensive heart and chronic kidney disease with heart failure and stage 1 through stage 4 chronic kidney disease, or unspecified chronic kidney disease: Secondary | ICD-10-CM | POA: Diagnosis present

## 2018-04-07 DIAGNOSIS — R52 Pain, unspecified: Secondary | ICD-10-CM

## 2018-04-07 LAB — URINALYSIS, COMPLETE (UACMP) WITH MICROSCOPIC
Bilirubin Urine: NEGATIVE
Glucose, UA: NEGATIVE mg/dL
Hgb urine dipstick: NEGATIVE
KETONES UR: NEGATIVE mg/dL
NITRITE: NEGATIVE
PH: 5 (ref 5.0–8.0)
Protein, ur: 100 mg/dL — AB
SPECIFIC GRAVITY, URINE: 1.023 (ref 1.005–1.030)
WBC, UA: >50 WBC/hpf — ABNORMAL HIGH (ref 0–5)

## 2018-04-07 LAB — COMPREHENSIVE METABOLIC PANEL
ALT: 18 U/L (ref 0–44)
AST: 28 U/L (ref 15–41)
Albumin: 4.3 g/dL (ref 3.5–5.0)
Alkaline Phosphatase: 138 U/L — ABNORMAL HIGH (ref 38–126)
Anion gap: 9 (ref 5–15)
BILIRUBIN TOTAL: 1.6 mg/dL — AB (ref 0.3–1.2)
BUN: 35 mg/dL — AB (ref 8–23)
CO2: 26 mmol/L (ref 22–32)
CREATININE: 2.32 mg/dL — AB (ref 0.61–1.24)
Calcium: 9.6 mg/dL (ref 8.9–10.3)
Chloride: 101 mmol/L (ref 98–111)
GFR calc Af Amer: 31 mL/min — ABNORMAL LOW (ref >60)
GFR, EST NON AFRICAN AMERICAN: 26 mL/min — AB (ref >60)
Glucose, Bld: 203 mg/dL — ABNORMAL HIGH (ref 70–99)
Potassium: 5.7 mmol/L — ABNORMAL HIGH (ref 3.5–5.1)
Sodium: 136 mmol/L (ref 135–145)
TOTAL PROTEIN: 7.6 g/dL (ref 6.5–8.1)

## 2018-04-07 LAB — CBC
HCT: 34.6 % — ABNORMAL LOW (ref 39.0–52.0)
Hemoglobin: 10.8 g/dL — ABNORMAL LOW (ref 13.0–17.0)
MCH: 29.4 pg (ref 26.0–34.0)
MCHC: 31.2 g/dL (ref 30.0–36.0)
MCV: 94.3 fL (ref 80.0–100.0)
PLATELETS: 215 10*3/uL (ref 150–400)
RBC: 3.67 MIL/uL — ABNORMAL LOW (ref 4.22–5.81)
RDW: 13.2 % (ref 11.5–15.5)
WBC: 9.3 10*3/uL (ref 4.0–10.5)
nRBC: 0 % (ref 0.0–0.2)

## 2018-04-07 MED ORDER — ONDANSETRON HCL 4 MG/2ML IJ SOLN: 4.0000 mg | Freq: Four times a day (QID) | INTRAMUSCULAR | Status: DC | PRN

## 2018-04-07 MED ORDER — HEPARIN SODIUM (PORCINE) 5000 UNIT/ML IJ SOLN
5000.0000 [IU] | Freq: Three times a day (TID) | INTRAMUSCULAR | Status: DC
  Administered 2018-04-07 – 2018-04-13 (×16): 5000 [IU] via SUBCUTANEOUS
  Filled 2018-04-07 (×15): qty 1

## 2018-04-07 MED ORDER — ALBUTEROL SULFATE HFA 108 (90 BASE) MCG/ACT IN AERS: 1.0000 | INHALATION_SPRAY | Freq: Four times a day (QID) | RESPIRATORY_TRACT | Status: DC | PRN

## 2018-04-07 MED ORDER — ACETAMINOPHEN 325 MG PO TABS: 650.0000 mg | ORAL_TABLET | Freq: Four times a day (QID) | ORAL | Status: DC | PRN

## 2018-04-07 MED ORDER — ONDANSETRON HCL 4 MG PO TABS: 4.0000 mg | ORAL_TABLET | Freq: Four times a day (QID) | ORAL | Status: DC | PRN

## 2018-04-07 MED ORDER — ALBUTEROL SULFATE (2.5 MG/3ML) 0.083% IN NEBU: 2.5000 mg | INHALATION_SOLUTION | RESPIRATORY_TRACT | Status: DC | PRN

## 2018-04-07 MED ORDER — GABAPENTIN 100 MG PO CAPS
200.0000 mg | ORAL_CAPSULE | Freq: Every day | ORAL | Status: DC
  Administered 2018-04-08: 200 mg via ORAL
  Filled 2018-04-07 (×2): qty 2

## 2018-04-07 MED ORDER — ASPIRIN EC 81 MG PO TBEC
81.0000 mg | DELAYED_RELEASE_TABLET | Freq: Every day | ORAL | Status: DC
  Administered 2018-04-08 – 2018-04-13 (×6): 81 mg via ORAL
  Filled 2018-04-07 (×6): qty 1

## 2018-04-07 MED ORDER — CITALOPRAM HYDROBROMIDE 20 MG PO TABS
10.0000 mg | ORAL_TABLET | Freq: Every day | ORAL | Status: DC
  Administered 2018-04-08 – 2018-04-11 (×4): 10 mg via ORAL
  Filled 2018-04-07 (×4): qty 1

## 2018-04-07 MED ORDER — SODIUM CHLORIDE 0.9 % IV SOLN
Freq: Once | INTRAVENOUS | Status: AC
Start: 2018-04-07 — End: 2018-04-07
  Administered 2018-04-07: 19:00:00 via INTRAVENOUS

## 2018-04-07 MED ORDER — SODIUM CHLORIDE 0.9 % IV SOLN
1.0000 g | Freq: Once | INTRAVENOUS | Status: AC
  Administered 2018-04-07: 1 g via INTRAVENOUS
  Filled 2018-04-07: qty 10

## 2018-04-07 MED ORDER — ACETAMINOPHEN 650 MG RE SUPP: 650.0000 mg | Freq: Four times a day (QID) | RECTAL | Status: DC | PRN

## 2018-04-07 MED ORDER — ATORVASTATIN CALCIUM 20 MG PO TABS
20.0000 mg | ORAL_TABLET | Freq: Every day | ORAL | Status: DC
  Administered 2018-04-07 – 2018-04-12 (×6): 20 mg via ORAL
  Filled 2018-04-07 (×6): qty 1

## 2018-04-07 MED ORDER — CARVEDILOL 3.125 MG PO TABS
3.1250 mg | ORAL_TABLET | Freq: Two times a day (BID) | ORAL | Status: DC
  Administered 2018-04-07 – 2018-04-08 (×2): 3.125 mg via ORAL
  Filled 2018-04-07 (×3): qty 1

## 2018-04-07 MED ORDER — PATIROMER SORBITEX CALCIUM 8.4 G PO PACK
8.4000 g | PACK | Freq: Once | ORAL | Status: AC
  Administered 2018-04-07: 8.4 g via ORAL
  Filled 2018-04-07: qty 1

## 2018-04-07 MED ORDER — SODIUM CHLORIDE 0.9 % IV SOLN
1.0000 g | INTRAVENOUS | Status: DC
  Administered 2018-04-08 – 2018-04-11 (×5): 1 g via INTRAVENOUS
  Filled 2018-04-07: qty 1
  Filled 2018-04-07: qty 10
  Filled 2018-04-07: qty 1
  Filled 2018-04-07 (×2): qty 10
  Filled 2018-04-07: qty 1

## 2018-04-07 MED ORDER — INSULIN GLARGINE 100 UNIT/ML ~~LOC~~ SOLN
30.0000 [IU] | Freq: Every day | SUBCUTANEOUS | Status: DC | PRN
  Filled 2018-04-07: qty 0.3

## 2018-04-07 MED ORDER — POLYETHYLENE GLYCOL 3350 17 G PO PACK: 17.0000 g | PACK | Freq: Every day | ORAL | Status: DC | PRN

## 2018-04-07 MED ORDER — PANTOPRAZOLE SODIUM 40 MG PO TBEC
40.0000 mg | DELAYED_RELEASE_TABLET | Freq: Every day | ORAL | Status: DC
  Administered 2018-04-08 – 2018-04-13 (×6): 40 mg via ORAL
  Filled 2018-04-07 (×6): qty 1

## 2018-04-07 MED ORDER — SODIUM CHLORIDE 0.9 % IV SOLN
INTRAVENOUS | Status: AC
  Administered 2018-04-07: 21:00:00 via INTRAVENOUS
  Administered 2018-04-08: 75 mL via INTRAVENOUS

## 2018-04-07 NOTE — ED Notes (Signed)
Patient given warm blanket. Family at bedside. 

## 2018-04-07 NOTE — H&P (Signed)
[facility] [place] NAME: Jesse Dickson    MR#:  [id number]  DATE OF BIRTH:  December 27, 1940  DATE OF ADMISSION:  04/07/2018  PRIMARY CARE PHYSICIAN: Cletis Athens, MD   REQUESTING/REFERRING PHYSICIAN: dr Cherylann Banas  CHIEF COMPLAINT:  left flank pain and generalized weakness  HISTORY OF PRESENT ILLNESS:  Jesse Dickson  is a 78 y.o. male with a known history of hypertension, chronic diastolic congestive heart failure, hypercholesterolemia, type II diabetes, CKD stage III comes to the emergency room with left flank pain for 2 to 3 days. Patient says his urine is dark. Denies any constipation. Denies any fever. In the ER workup revealed patient has UTI and acute on chronic dehydration. CT scan of the abdomen was unremarkable other than bilateral pleural effusion with atelectasis likely chronic  patient received IV fluid. He also received IV Rocephin he is being admitted for further evaluation of management.  wife and son are present in the ER.  PAST MEDICAL HISTORY:   Past Medical History:  Diagnosis Date  . Chronic diastolic CHF (congestive heart failure) ([place])    a. TTE 05/2015 EF > 55%  . Chronic kidney disease (CKD), stage III (moderate) (HCC)   . DVT (deep venous thrombosis) ([place])   . GERD (gastroesophageal reflux disease)   . Hypercholesteremia   . Hypertension   . Insulin dependent diabetes mellitus ([place])   . MGUS (monoclonal gammopathy of unknown significance)   . Prostate CA ([place]) 2015   only surgery per pt    PAST SURGICAL HISTOIRY:   Past Surgical History:  Procedure Laterality Date  . PROSTATECTOMY      SOCIAL HISTORY:   Social History   Tobacco Use  . Smoking status: Never Smoker  . Smokeless tobacco: Never Used  Substance Use Topics  . Alcohol use: No    Frequency: Never    FAMILY HISTORY:   Family History  Problem Relation Age of Onset  . Diabetes Mother   . COPD Mother   . Cancer Sister    . Cancer Sister   . Dementia Sister     DRUG ALLERGIES:  No Known Allergies  REVIEW OF SYSTEMS:  Review of Systems  Constitutional: Positive for malaise/fatigue. Negative for chills, fever and weight loss.  HENT: Negative for ear discharge, ear pain and nosebleeds.   Eyes: Negative for blurred vision, pain and discharge.  Respiratory: Negative for sputum production, shortness of breath, wheezing and stridor.   Cardiovascular: Negative for chest pain, palpitations, orthopnea and PND.  Gastrointestinal: Negative for abdominal pain, diarrhea, nausea and vomiting.  Genitourinary: Positive for flank pain. Negative for frequency and urgency.  Musculoskeletal: Negative for back pain and joint pain.  Neurological: Positive for weakness. Negative for sensory change, speech change and focal weakness.  Psychiatric/Behavioral: Negative for depression and hallucinations. The patient is not nervous/anxious.      MEDICATIONS AT HOME:   Prior to Admission medications   Medication Sig Start Date End Date Taking? Authorizing Provider  aspirin EC 81 MG tablet Take 81 mg by mouth daily.    Yes [provider]  atorvastatin (LIPITOR) 20 MG tablet Take 20 mg by mouth at bedtime.    Yes [provider]  citalopram (CELEXA) 10 MG tablet Take 10 mg by mouth daily.   Yes [provider]  furosemide (LASIX) 40 MG tablet Take 40 mg by mouth 2 (two) times daily.    Yes [provider]  gabapentin (NEURONTIN) 100  MG capsule Take 200 mg by mouth daily. 06/20/16  Yes [provider]  linaclotide Rolan Lipa) 290 MCG CAPS capsule Take 1 capsule (290 mcg total) by mouth daily before breakfast. 03/14/17 09/20/18 Yes Jonathon Bellows, MD  losartan (COZAAR) 25 MG tablet Take 25 mg by mouth daily.   Yes [provider]  omeprazole (PRILOSEC) 20 MG capsule Take 20 mg by mouth daily.   Yes [provider]  TRESIBA FLEXTOUCH 100 UNIT/ML SOPN FlexTouch Pen Inject 30 Units  into the skin daily as needed (hyperglycemia).    Yes [provider]  VENTOLIN HFA 108 (90 Base) MCG/ACT inhaler INHALE 1 PUFF BY MOUTH 3 TIMES A DAY AS NEEDED 11/16/17  Yes Deboraha Sprang, MD  carvedilol (COREG) 3.125 MG tablet Take 1 tablet (3.125 mg total) by mouth 2 (two) times daily. 11/16/17 12/27/18  Deboraha Sprang, MD      VITAL SIGNS:  Blood pressure 122/69, pulse 93, temperature 98.2 F (36.8 C), temperature source Oral, resp. rate 16, SpO2 98 %.  PHYSICAL EXAMINATION:  GENERAL:  78 y.o.-year-old patient lying in the bed with no acute distress.  EYES: Pupils equal, round, reactive to light and accommodation. No scleral icterus. Extraocular muscles intact.  HEENT: Head atraumatic, normocephalic. Oropharynx and nasopharynx clear.  NECK:  Supple, no jugular venous distention. No thyroid enlargement, no tenderness.  LUNGS: Normal breath sounds bilaterally, no wheezing, rales,rhonchi or crepitation. No use of accessory muscles of respiration.  CARDIOVASCULAR: S1, S2 normal. No murmurs, rubs, or gallops.  ABDOMEN: Soft, mildly tender left flank, nondistended. Bowel sounds present. No organomegaly or mass.  EXTREMITIES: No pedal edema, cyanosis, or clubbing.  NEUROLOGIC: Cranial nerves II through XII are intact. Muscle strength 5/5 in all extremities. Sensation intact. Gait not checked.  PSYCHIATRIC: The patient is alert and oriented x 3.  SKIN: No obvious rash, lesion, or ulcer.   LABORATORY PANEL:   CBC Recent Labs  Lab 04/07/18 1441  WBC 9.3  HGB 10.8*  HCT 34.6*  PLT 215   ------------------------------------------------------------------------------------------------------------------  Chemistries  Recent Labs  Lab 04/07/18 1441  NA 136  K 5.7*  CL 101  CO2 26  GLUCOSE 203*  BUN 35*  CREATININE 2.32*  CALCIUM 9.6  AST 28  ALT 18  ALKPHOS 138*  BILITOT 1.6*    ------------------------------------------------------------------------------------------------------------------  Cardiac Enzymes No results for input(s): TROPONINI in the last 168 hours. ------------------------------------------------------------------------------------------------------------------  RADIOLOGY:  Ct Abdomen Pelvis Wo Contrast  Result Date: 04/07/2018 CLINICAL DATA:  Left flank and groin pain for 2 days. EXAM: CT ABDOMEN AND PELVIS WITHOUT CONTRAST TECHNIQUE: Multidetector CT imaging of the abdomen and pelvis was performed following the standard protocol without IV contrast. COMPARISON:  PET CT scan 04/04/2017. FINDINGS: Lower chest: There is cardiomegaly. No pericardial effusion. Calcific coronary artery disease is identified. Moderate bilateral pleural effusions and compressive atelectasis are seen. Hepatobiliary: No focal liver abnormality is seen. No gallstones, gallbladder wall thickening, or biliary dilatation. Pancreas: Unremarkable. No pancreatic ductal dilatation or surrounding inflammatory changes. Spleen: Normal in size without focal abnormality. Adrenals/Urinary Tract: Adrenal glands are unremarkable. Kidneys are normal, without renal calculi, focal lesion, or hydronephrosis. The urinary bladder is incompletely distended. Its walls appear thickened. Stomach/Bowel: The patient appears to be status post appendectomy. The stomach and small and large bowel are unremarkable. Vascular/Lymphatic: Atherosclerotic vascular disease is identified. No lymphadenopathy. Reproductive: Status post prostatectomy. Other: Mild mesenteric and body wall edema is present. No focal fluid collection. Three small fat containing midline ventral hernias are  identified. Musculoskeletal: No acute abnormality. No lytic or sclerotic lesion. IMPRESSION: Moderate bilateral pleural effusions with associated compressive basilar atelectasis. Mild mesenteric and body wall edema most suggestive of volume  overload. Cardiomegaly. Calcific aortic and coronary atherosclerosis. Status post prostatectomy. Small fat containing ventral hernias. Electronically Signed   By: Inge Rise M.D.   On: 04/07/2018 15:47    EKG:    IMPRESSION AND PLAN:   Jesse Dickson  is a 78 y.o. male with a known history of hypertension, chronic diastolic congestive heart failure, hypercholesterolemia, type II diabetes, CKD stage III comes to the emergency room with left flank pain for 2 to 3 days. Patient says his urine is dark. Denies any constipation. Denies any fever.  1. Acute on chronic renal failure/CKD stage III likely due to dehydration -baseline creatinine around 1.3 -comes in with creatinine of 2.3 -IV fluids -avoid nephrotoxic monitor input output  2. Hyperkalemia likely due to number one -IV fluid -I will give one dose of veltassa -BMP in the morning  3. Type II diabetes -continue insulin and sliding scale  4. Hypertension -I will hold ACE inhibitor given elevated potassium and creatinine resume when potassium corrected and creatinine improved  5. DVT prophylaxis subcu heparin  Discussed with wife and son    All the records are reviewed and case discussed with ED provider.   CODE STATUS: full  TOTAL TIME TAKING CARE OF THIS PATIENT: 50 minutes.    Fritzi Mandes M.D on 04/07/2018 at 7:49 PM  Between 7am to 6pm - Pager - [phone]  After 6pm go to [email or web address] - password [email or web address] [facility]  [facility]  Office  [phone]  CC: Primary care physician; Cletis Athens, MD

## 2018-04-07 NOTE — Progress Notes (Signed)
Family Meeting Note  Advance Directive:no Today a meeting took place with the  patient wife and son  Came in with generalized weakness and left flank pain. Was have UTI and acute on chronic kidney disease secondary dehydration. Potassium was mildly elevated. Patient is being admitted for IV antibiotics and IV fluids. Code status discussed patient is a full code. Time spent during discussion: 16 mins Fritzi Mandes, MD

## 2018-04-07 NOTE — ED Triage Notes (Signed)
Pt to ED via POV c/o left flank pain and groin pain that started 2 days ago. Pt denies fever, N/V. Pt has had some diarrhea. Pt is in NAD at this time.

## 2018-04-07 NOTE — ED Notes (Signed)
ED tech Jesse Dickson offered to help patient to stand to void. Per [name]'s report, patient was unable to void at this time. Patient was given a urinal per Jesse Dickson and asked to call when needed.

## 2018-04-07 NOTE — ED Provider Notes (Signed)
[facility] Emergency Department Provider Note ____________________________________________   First MD Initiated Contact with Patient 04/07/18 1317     (approximate)  I have reviewed the triage vital signs and the nursing notes.   HISTORY  Chief Complaint Flank Pain and Groin Injury    HPI Blanca Thornton is a 78 y.o. male with PMH as noted below who presents with left lower quadrant abdominal pain and left groin pain over the last 2 days, persistent course, nonradiating, associated with some diarrhea but not with nausea or vomiting.  He denies any fever or urinary symptoms.  No prior history of this pain.   Past Medical History:  Diagnosis Date  . Chronic diastolic CHF (congestive heart failure) ([place])    a. TTE 05/2015 EF > 55%  . Chronic kidney disease (CKD), stage III (moderate) (HCC)   . DVT (deep venous thrombosis) ([place])   . GERD (gastroesophageal reflux disease)   . Hypercholesteremia   . Hypertension   . Insulin dependent diabetes mellitus ([place])   . MGUS (monoclonal gammopathy of unknown significance)   . Prostate CA ([place]) 2015   only surgery per pt    Patient Active Problem List   Diagnosis Date Noted  . CHF (congestive heart failure) ([place]) 09/19/2017  . Elevated troponin   . Sinus pause   . HTN (hypertension) 07/07/2017  . Lymphedema 07/07/2017  . Chest pain 06/28/2017  . Acute on chronic diastolic heart failure ([place]) 09/13/2016  . Chronic low back pain 09/13/2016  . GERD without esophagitis 09/13/2016  . Insulin dependent diabetes mellitus ([place]) 09/13/2016  . Pure hypercholesterolemia 09/13/2016  . Anemia of chronic disease 05/18/2015  . Chronic kidney disease, stage 3 ([place]) 05/18/2015    Past Surgical History:  Procedure Laterality Date  . PROSTATECTOMY      Prior to Admission medications   Medication Sig Start Date End Date Taking? Authorizing Provider  aspirin EC 81 MG tablet Take 81 mg by mouth daily.    Yes [provider]  atorvastatin (LIPITOR) 20 MG tablet Take 20 mg by mouth at bedtime.    Yes [provider]  citalopram (CELEXA) 10 MG tablet Take 10 mg by mouth daily.   Yes [provider]  furosemide (LASIX) 40 MG tablet Take 40 mg by mouth 2 (two) times daily.    Yes [provider]  gabapentin (NEURONTIN) 100 MG capsule Take 200 mg by mouth daily. 06/20/16  Yes [provider]  linaclotide Rolan Lipa) 290 MCG CAPS capsule Take 1 capsule (290 mcg total) by mouth daily before breakfast. 03/14/17 09/20/18 Yes Jonathon Bellows, MD  losartan (COZAAR) 25 MG tablet Take 25 mg by mouth daily.   Yes [provider]  omeprazole (PRILOSEC) 20 MG capsule Take 20 mg by mouth daily.   Yes [provider]  TRESIBA FLEXTOUCH 100 UNIT/ML SOPN FlexTouch Pen Inject 30 Units into the skin daily as needed (hyperglycemia).    Yes [provider]  VENTOLIN HFA 108 (90 Base) MCG/ACT inhaler INHALE 1 PUFF BY MOUTH 3 TIMES A DAY AS NEEDED 11/16/17  Yes Deboraha Sprang, MD  carvedilol (COREG) 3.125 MG tablet Take 1 tablet (3.125 mg total) by mouth 2 (two) times daily. 11/16/17 12/27/18  Deboraha Sprang, MD    Allergies Patient has no known allergies.  Family History  Problem Relation Age of Onset  . Diabetes Mother   . COPD Mother   . Cancer Sister   . Cancer Sister   .  Dementia Sister     Social History Social History   Tobacco Use  . Smoking status: Never Smoker  . Smokeless tobacco: Never Used  Substance Use Topics  . Alcohol use: No    Frequency: Never  . Drug use: No    Review of Systems  Constitutional: No fever. Eyes: No redness. ENT: No sore throat. Cardiovascular: Denies chest pain. Respiratory: Denies shortness of breath. Gastrointestinal: No nausea, no vomiting.  Positive for diarrhea.  Genitourinary: Negative for dysuria or hematuria.  Musculoskeletal: Negative for back pain. Skin: Negative for rash. Neurological: Negative for  headache.   ____________________________________________   PHYSICAL EXAM:  VITAL SIGNS: ED Triage Vitals  Enc Vitals Group     BP 04/07/18 1240 (!) 117/59     Pulse Rate 04/07/18 1240 92     Resp 04/07/18 1240 16     Temp 04/07/18 1240 98.2 F (36.8 C)     Temp Source 04/07/18 1240 Oral     SpO2 04/07/18 1240 100 %     Weight --      Height --      Head Circumference --      Peak Flow --      Pain Score 04/07/18 1241 8     Pain Loc --      Pain Edu? --      Excl. in [place]? --     Constitutional: Alert and oriented.  Relatively well appearing and in no acute distress. Eyes: Conjunctivae are normal.  Head: Atraumatic. Nose: No congestion/rhinnorhea. Mouth/Throat: Mucous membranes are moist.   Neck: Normal range of motion.  Cardiovascular: Good peripheral circulation. Respiratory: Normal respiratory effort.  Gastrointestinal: Soft with some left lower quadrant tenderness. No distention.  No swelling or tenderness in the left inguinal area. Genitourinary: No CVA or flank tenderness. Musculoskeletal: Extremities warm and well perfused.  Neurologic:  Normal speech and language. No gross focal neurologic deficits are appreciated.  Skin:  Skin is warm and dry. No rash noted. Psychiatric: Mood and affect are normal. Speech and behavior are normal.  ____________________________________________   LABS (all labs ordered are listed, but only abnormal results are displayed)  Labs Reviewed  COMPREHENSIVE METABOLIC PANEL - Abnormal; Notable for the following components:      Result Value   Potassium 5.7 (*)    Glucose, Bld 203 (*)    BUN 35 (*)    Creatinine, Ser 2.32 (*)    Alkaline Phosphatase 138 (*)    Total Bilirubin 1.6 (*)    GFR calc non Af Amer 26 (*)    GFR calc Af Amer 31 (*)    All other components within normal limits  CBC - Abnormal; Notable for the following components:   RBC 3.67 (*)    Hemoglobin 10.8 (*)    HCT 34.6 (*)    All other components within  normal limits  URINALYSIS, COMPLETE (UACMP) WITH MICROSCOPIC - Abnormal; Notable for the following components:   Color, Urine AMBER (*)    APPearance TURBID (*)    Protein, ur 100 (*)    Leukocytes, UA SMALL (*)    WBC, UA >50 (*)    Bacteria, UA FEW (*)    All other components within normal limits   ____________________________________________  EKG  ED ECG REPORT I, Arta Silence, the attending physician, personally viewed and interpreted this ECG.  Date: 04/07/2018 EKG Time: 1624 Rate: 87 Rhythm: normal sinus rhythm QRS Axis: normal Intervals: Prolonged PR, IVCD ST/T Wave abnormalities: Nonspecific  ST abnormalities, chronic Narrative Interpretation: no evidence of acute ischemia  ____________________________________________  RADIOLOGY  CT abdomen: Bilateral pleural effusions and mesenteric edema.  No other acute abnormalities.  ____________________________________________   PROCEDURES  Procedure(s) performed: No  Procedures  Critical Care performed: No ____________________________________________   INITIAL IMPRESSION / ASSESSMENT AND PLAN / ED COURSE  Pertinent labs & imaging results that were available during my care of the patient were reviewed by me and considered in my medical decision making (see chart for details).  78 year old male with PMH as noted above presents with left lower quadrant and left groin pain over the last 2 days associated with some diarrhea, but not with nausea, vomiting, dysuria, or fever.  I reviewed the past medical records in [place].  The patient was most recently admitted in July of last year for CHF exacerbation.  On exam he is relatively well-appearing and his vital signs are normal.  He does have some tenderness in the left lower quadrant but no tenderness or swelling in the groin or inguinal area.  The remainder of the exam is unremarkable.  Differential includes mild enteritis or colitis, versus diverticulitis, UTI, or  musculoskeletal pain.  We will obtain lab work-up, UA, CT abdomen, and reassess.  ----------------------------------------- 6:57 PM on 04/07/2018 -----------------------------------------  CT abdomen showed no significant acute findings although the patient does have bilateral pleural effusions.  Lab work-up revealed elevated creatinine which is new for the patient from his last labs several months ago.  The patient did not give a urine sample for some time, however once he did give 1 it is consistent with UTI.  Given the acute renal insufficiency and UTI, he will require admission.  I ordered IV ceftriaxone.  I signed the patient out to the hospitalist Dr. Posey Pronto at approximately 6:55 PM.  ____________________________________________   FINAL CLINICAL IMPRESSION(S) / ED DIAGNOSES  Final diagnoses:  Acute renal failure, unspecified acute renal failure type ([place])  Abdominal pain, unspecified abdominal location  Urinary tract infection without hematuria, site unspecified      NEW MEDICATIONS STARTED DURING THIS VISIT:  New Prescriptions   No medications on file     Note:  This document was prepared using Dragon voice recognition software and may include unintentional dictation errors.    Arta Silence, MD 04/07/18 [phone]

## 2018-04-07 NOTE — ED Notes (Signed)
Patient given Ed sandwich tray and diet ginger ale per Dr. Posey Pronto.

## 2018-04-07 NOTE — ED Notes (Signed)
Two unsuccessful IV attempts. Will defer to another RN. 

## 2018-04-08 ENCOUNTER — Inpatient Hospital Stay: Payer: Medicare HMO

## 2018-04-08 LAB — PROTEIN / CREATININE RATIO, URINE
Creatinine, Urine: 457 mg/dL
Protein Creatinine Ratio: 1.25 mg/mg{Cre} — ABNORMAL HIGH (ref 0.00–0.15)
Total Protein, Urine: 570 mg/dL

## 2018-04-08 LAB — BASIC METABOLIC PANEL
ANION GAP: 6 (ref 5–15)
BUN: 38 mg/dL — AB (ref 8–23)
CHLORIDE: 104 mmol/L (ref 98–111)
CO2: 27 mmol/L (ref 22–32)
Calcium: 9 mg/dL (ref 8.9–10.3)
Creatinine, Ser: 2.67 mg/dL — ABNORMAL HIGH (ref 0.61–1.24)
GFR calc Af Amer: 26 mL/min — ABNORMAL LOW (ref >60)
GFR calc non Af Amer: 22 mL/min — ABNORMAL LOW (ref >60)
GLUCOSE: 153 mg/dL — AB (ref 70–99)
POTASSIUM: 4.8 mmol/L (ref 3.5–5.1)
Sodium: 137 mmol/L (ref 135–145)

## 2018-04-08 LAB — GLUCOSE, CAPILLARY
Glucose-Capillary: 81 mg/dL (ref 70–99)
Glucose-Capillary: 79 mg/dL (ref 70–99)

## 2018-04-08 MED ORDER — INSULIN ASPART 100 UNIT/ML ~~LOC~~ SOLN
0.0000 [IU] | Freq: Every day | SUBCUTANEOUS | Status: DC
  Administered 2018-04-12: 2 [IU] via SUBCUTANEOUS
  Filled 2018-04-08: qty 1

## 2018-04-08 MED ORDER — INSULIN ASPART 100 UNIT/ML ~~LOC~~ SOLN
0.0000 [IU] | Freq: Three times a day (TID) | SUBCUTANEOUS | Status: DC
  Administered 2018-04-09 – 2018-04-10 (×2): 1 [IU] via SUBCUTANEOUS
  Administered 2018-04-11: 2 [IU] via SUBCUTANEOUS
  Administered 2018-04-12 (×2): 1 [IU] via SUBCUTANEOUS
  Administered 2018-04-12: 3 [IU] via SUBCUTANEOUS
  Administered 2018-04-13: 2 [IU] via SUBCUTANEOUS
  Filled 2018-04-08 (×7): qty 1

## 2018-04-08 MED ORDER — GABAPENTIN 100 MG PO CAPS
100.0000 mg | ORAL_CAPSULE | Freq: Every day | ORAL | Status: DC
  Administered 2018-04-09 – 2018-04-13 (×5): 100 mg via ORAL
  Filled 2018-04-08 (×5): qty 1

## 2018-04-08 NOTE — Evaluation (Signed)
Physical Therapy Evaluation Patient Details Name: Jesse Dickson MRN: [id number] DOB: Sep 05, 1940 Today's Date: 04/08/2018   History of Present Illness  pt is a 78 y.o M admitted on 04/07/2018 with dx of acute on chronic renal failure. known history of hypertension, chronic diastolic congestive heart failure, hypercholesterolemia, type II diabetes, CKD stage III. pt went to ED reporting R flank pain and was dx with a UTI and dehydration.   Clinical Impression  Pt currently sleeping in bed when PT entered the room. Pt is A&O x 4 and initially reports no pain. He is able to perform general bed mobility Independently and requires MODI with transfers using SPC. He demontrates functional strength while sitting EOB. He ambulated ~70 with SPC and fatigued quickly reporting pain increased to 9/10 in the R flank, RN was notified. Once sitting in the recliner pt reported pain dropped to 6/10. SPC height was adjusted for appropriate use. He would benefit from physical therapy to decrease R flank pain, improve endurance/ mobility with LRAD and maximize his function. Upon discharge he would benefit from continued physical therapy via HHPT.     Follow Up Recommendations Home health PT    Equipment Recommendations  None recommended by PT(pt has equipment at home)    Recommendations for Other Services       Precautions / Restrictions Precautions Precautions: None Restrictions Weight Bearing Restrictions: No      Mobility  Bed Mobility Overal bed mobility: Independent                Transfers Overall transfer level: Independent                  Ambulation/Gait Ambulation/Gait assistance: Supervision Gait Distance (Feet): 70 Feet Assistive device: Straight cane Gait Pattern/deviations: Step-through pattern;Decreased stride length;Trendelenburg;Antalgic;Trunk flexed;Narrow base of support     General Gait Details: at ~ 61ft pt reported having R flank pain rated at 9/10  Stairs            Wheelchair Mobility    Modified Rankin (Stroke Patients Only)       Balance                                             Pertinent Vitals/Pain Pain Assessment: 0-10 Pain Score: 9 (sitting on EOB 0-10 pain, during gait 9/10) Pain Location: R flank Pain Descriptors / Indicators: Aching;Sore Pain Intervention(s): Monitored during session;Limited activity within patient's tolerance    Home Living Family/patient expects to be discharged to:: Private residence Living Arrangements: Spouse/significant other;Children Available Help at Discharge: Family;Available 24 hours/day Type of Home: House Home Access: Level entry     Home Layout: Two level;Able to live on main level with bedroom/bathroom Home Equipment: Kasandra Knudsen - single point;Walker - 2 wheels      Prior Function Level of Independence: Independent with assistive device(s)         Comments: Pt uses SPC for shorter distances but uses walker for longer distances.  No reported falls in past 6 months.     Hand Dominance   Dominant Hand: Right    Extremity/Trunk Assessment   Upper Extremity Assessment Upper Extremity Assessment: Overall WFL for tasks assessed    Lower Extremity Assessment Lower Extremity Assessment: RLE deficits/detail;LLE deficits/detail RLE Deficits / Details: 4/5  LLE Deficits / Details: 4/5       Communication   Communication: No difficulties  Cognition Arousal/Alertness: Awake/alert Behavior During Therapy: WFL for tasks assessed/performed Overall Cognitive Status: Within Functional Limits for tasks assessed                                        General Comments      Exercises     Assessment/Plan    PT Assessment Patient needs continued PT services  PT Problem List Decreased activity tolerance;Decreased balance;Pain;Decreased mobility;Decreased strength       PT Treatment Interventions Gait training;Functional mobility  training;Therapeutic activities;Therapeutic exercise;Balance training;Stair training    PT Goals (Current goals can be found in the Care Plan section)  Acute Rehab PT Goals Patient Stated Goal: to be able to go back home PT Goal Formulation: With patient Time For Goal Achievement: 04/22/18 Potential to Achieve Goals: Good    Frequency Min 2X/week   Barriers to discharge        Co-evaluation               AM-PAC PT "6 Clicks" Mobility  Outcome Measure Help needed turning from your back to your side while in a flat bed without using bedrails?: None Help needed moving from lying on your back to sitting on the side of a flat bed without using bedrails?: None Help needed moving to and from a bed to a chair (including a wheelchair)?: A Little Help needed standing up from a chair using your arms (e.g., wheelchair or bedside chair)?: A Little Help needed to walk in hospital room?: A Little Help needed climbing 3-5 steps with a railing? : Total 6 Click Score: 18    End of Session Equipment Utilized During Treatment: Gait belt Activity Tolerance: Patient tolerated treatment well;Patient limited by pain Patient left: in chair;with chair alarm set;with nursing/sitter in room Nurse Communication: Mobility status PT Visit Diagnosis: Unsteadiness on feet (R26.81);Pain;Muscle weakness (generalized) (M62.81);Difficulty in walking, not elsewhere classified (R26.2) Pain - Right/Left: Right Pain - part of body: (flank)    Time: 8242-3536 PT Time Calculation (min) (ACUTE ONLY): 23 min   Charges:   PT Evaluation $PT Eval Low Complexity: 1 Low PT Treatments $Gait Training: 8-22 mins        Kenneith Stief PT, DPT, LAT, ATC  04/08/18  11:13 AM       [name] 04/08/2018, 11:08 AM

## 2018-04-08 NOTE — Consult Note (Signed)
[place], [place] 04/08/18  Subjective:   Patient is known to our practice from outpatient follow-up.  He is followed by Dr. Holley Raring for chronic kidney disease.  He presents to the emergency room yesterday for right flank pain and lower abdominal pain.  There is some question about difficulty voiding.  Bladder scan at bedside today is variable but ranges from 2 40-3 75.  Patient states he has not voided since this morning. Serum creatinine has increased to 2.67.  Nephrology consult has been requested for evaluation  Objective:  Vital signs in last 24 hours:  Temp:  [98.2 F (36.8 C)-98.7 F (37.1 C)] 98.7 F (37.1 C) (02/01 2107) Pulse Rate:  [81-95] 86 (02/01 2107) Resp:  [15-19] 18 (02/01 2107) BP: (117-139)/(59-73) 126/70 (02/01 2107) SpO2:  [95 %-100 %] 97 % (02/01 2107) Weight:  [83.9 kg] 83.9 kg (02/01 2309)  Weight change:  Filed Weights   04/07/18 2309  Weight: 83.9 kg    Intake/Output:    Intake/Output Summary (Last 24 hours) at 04/08/2018 1112 Last data filed at 04/08/2018 0631 Gross per 24 hour  Intake 858.14 ml  Output -  Net 858.14 ml     Physical Exam: General:  No acute distress, sitting up in chair  HEENT  moist oral mucous membranes, anicteric  Neck  no distended neck veins  Pulm/lungs  decreased breath sounds at bases, no crackles  CVS/Heart  regular, soft systolic murmur  Abdomen:   Soft, nontender  Extremities:  Trace to 1+ pitting edema bilaterally  Neurologic:  Alert, oriented.  Speech somewhat hard to understand  Skin:  No acute rashes    Basic Metabolic Panel:  Recent Labs  Lab 04/07/18 1441 04/08/18 0354  NA 136 137  K 5.7* 4.8  CL 101 104  CO2 26 27  GLUCOSE 203* 153*  BUN 35* 38*  CREATININE 2.32* 2.67*  CALCIUM 9.6 9.0     CBC: Recent Labs  Lab 04/07/18 1441  WBC 9.3  HGB 10.8*  HCT 34.6*  MCV 94.3  PLT 215     No results found for: HEPBSAG, HEPBSAB, HEPBIGM    Microbiology:  No  results found for this or any previous visit (from the past 240 hour(s)).  Coagulation Studies: No results for input(s): LABPROT, INR in the last 72 hours.  Urinalysis: Recent Labs    04/07/18 1759  COLORURINE AMBER*  LABSPEC 1.023  PHURINE 5.0  GLUCOSEU NEGATIVE  HGBUR NEGATIVE  BILIRUBINUR NEGATIVE  KETONESUR NEGATIVE  PROTEINUR 100*  NITRITE NEGATIVE  LEUKOCYTESUR SMALL*      Imaging: Ct Abdomen Pelvis Wo Contrast  Result Date: 04/07/2018 CLINICAL DATA:  Left flank and groin pain for 2 days. EXAM: CT ABDOMEN AND PELVIS WITHOUT CONTRAST TECHNIQUE: Multidetector CT imaging of the abdomen and pelvis was performed following the standard protocol without IV contrast. COMPARISON:  PET CT scan 04/04/2017. FINDINGS: Lower chest: There is cardiomegaly. No pericardial effusion. Calcific coronary artery disease is identified. Moderate bilateral pleural effusions and compressive atelectasis are seen. Hepatobiliary: No focal liver abnormality is seen. No gallstones, gallbladder wall thickening, or biliary dilatation. Pancreas: Unremarkable. No pancreatic ductal dilatation or surrounding inflammatory changes. Spleen: Normal in size without focal abnormality. Adrenals/Urinary Tract: Adrenal glands are unremarkable. Kidneys are normal, without renal calculi, focal lesion, or hydronephrosis. The urinary bladder is incompletely distended. Its walls appear thickened. Stomach/Bowel: The patient appears to be status post appendectomy. The stomach and small and large bowel are unremarkable. Vascular/Lymphatic: Atherosclerotic vascular disease  is identified. No lymphadenopathy. Reproductive: Status post prostatectomy. Other: Mild mesenteric and body wall edema is present. No focal fluid collection. Three small fat containing midline ventral hernias are identified. Musculoskeletal: No acute abnormality. No lytic or sclerotic lesion. IMPRESSION: Moderate bilateral pleural effusions with associated compressive  basilar atelectasis. Mild mesenteric and body wall edema most suggestive of volume overload. Cardiomegaly. Calcific aortic and coronary atherosclerosis. Status post prostatectomy. Small fat containing ventral hernias. Electronically Signed   By: Inge Rise M.D.   On: 04/07/2018 15:47     Medications:   . sodium chloride 75 mL (04/08/18 0734)  . cefTRIAXone (ROCEPHIN)  IV 1 g (04/08/18 0211)   . aspirin EC  81 mg Oral Daily  . atorvastatin  20 mg Oral QHS  . carvedilol  3.125 mg Oral BID  . citalopram  10 mg Oral Daily  . [START ON 04/09/2018] gabapentin  100 mg Oral Daily  . heparin  5,000 Units Subcutaneous Q8H  . pantoprazole  40 mg Oral Daily   acetaminophen **OR** acetaminophen, albuterol, insulin glargine, ondansetron **OR** ondansetron (ZOFRAN) IV, polyethylene glycol  Assessment/ Plan:  78 y.o. male  hyperlipidemia, diabetes mellitus type 2 greater than 20 years, chronic diastolic heart failure, GERD, anemia chronic kidney disease, prostate cancer, chronic low back pain, history of left lower extremity DVT   Acute kidney injury.  Cause is unclear. -Possibly related to hemodynamic changes versus urinary retention -Renal ultrasound is pending - Some LE edema. CT shows tissue edema and pleural effusions. Question about volume . - Since oral intake is poor, may continue low dose iv saline for now.  Chronic kidney disease stage III.  Baseline creatinine 1.67/GFR 45 from October 12, 2017 -Likely secondary to atherosclerosis, hypertension and diabetes  Proteinuria Obtain urine protein to creatinine ratio  Hyperkalemia -Treated with [place]  DM-2 with CKD - Obtain hbA1c - Hb A1c 7.5% from feb 2018   LOS: [place] 2/2/202011:12 [place], [place]  Note: This note was prepared with Dragon dictation. Any transcription errors are unintentional

## 2018-04-08 NOTE — Progress Notes (Signed)
Patient ID: Jesse Dickson, male   DOB: [birth year], 78 y.o.   MRN: [id number]  [facility] PROGRESS NOTE  Malcomb Gangemi SHF:[id number] DOB: 28-Mar-1940 DOA: 04/07/2018 PCP: Cletis Athens, MD  HPI/Subjective: Patient having some lower abdominal pain.  Not urinating very much.  Spoke with wife on the phone and she states despite double doses of water pills he still does not urinate very much.  The patient occasionally has some shortness of breath.  Objective: Vitals:   04/07/18 2107 04/08/18 0815  BP: 126/70 110/67  Pulse: 86 87  Resp: 18 18  Temp: 98.7 F (37.1 C) 98.4 F (36.9 C)  SpO2: 97% 98%    Filed Weights   04/07/18 2309  Weight: 83.9 kg    ROS: Review of Systems  Constitutional: Negative for chills and fever.  Eyes: Negative for blurred vision.  Respiratory: Negative for cough and shortness of breath.   Cardiovascular: Negative for chest pain.  Gastrointestinal: Negative for abdominal pain, constipation, diarrhea, nausea and vomiting.  Genitourinary: Negative for dysuria.  Musculoskeletal: Negative for joint pain.  Neurological: Negative for dizziness and headaches.   Exam: Physical Exam  Constitutional: He is oriented to person, place, and time.  HENT:  Nose: No mucosal edema.  Mouth/Throat: No oropharyngeal exudate or posterior oropharyngeal edema.  Eyes: Pupils are equal, round, and reactive to light. Conjunctivae, EOM and lids are normal.  Neck: No JVD present. Carotid bruit is not present. No edema present. No thyroid mass and no thyromegaly present.  Cardiovascular: S1 normal and S2 normal. Exam reveals no gallop.  No murmur heard. Pulses:      Dorsalis pedis pulses are 2+ on the right side and 2+ on the left side.  Respiratory: No respiratory distress. He has decreased breath sounds in the right lower field and the left lower field. He has no wheezes. He has no rhonchi. He has no rales.  GI: Soft. Bowel sounds are normal. There is no abdominal  tenderness.  Musculoskeletal:     Right ankle: He exhibits swelling.     Left ankle: He exhibits swelling.  Lymphadenopathy:    He has no cervical adenopathy.  Neurological: He is alert and oriented to person, place, and time. No cranial nerve deficit.  Skin: Skin is warm. No rash noted. Nails show no clubbing.  Psychiatric: He has a normal mood and affect.      Data Reviewed: Basic Metabolic Panel: Recent Labs  Lab 04/07/18 1441 04/08/18 0354  NA 136 137  K 5.7* 4.8  CL 101 104  CO2 26 27  GLUCOSE 203* 153*  BUN 35* 38*  CREATININE 2.32* 2.67*  CALCIUM 9.6 9.0   Liver Function Tests: Recent Labs  Lab 04/07/18 1441  AST 28  ALT 18  ALKPHOS 138*  BILITOT 1.6*  PROT 7.6  ALBUMIN 4.3   CBC: Recent Labs  Lab 04/07/18 1441  WBC 9.3  HGB 10.8*  HCT 34.6*  MCV 94.3  PLT 215   BNP (last 3 results) Recent Labs    06/26/17 1621 06/28/17 0023 09/19/17 0612  BNP 332.0* 362.0* 313.0*      Studies: Ct Abdomen Pelvis Wo Contrast  Result Date: 04/07/2018 CLINICAL DATA:  Left flank and groin pain for 2 days. EXAM: CT ABDOMEN AND PELVIS WITHOUT CONTRAST TECHNIQUE: Multidetector CT imaging of the abdomen and pelvis was performed following the standard protocol without IV contrast. COMPARISON:  PET CT scan 04/04/2017. FINDINGS: Lower chest: There is cardiomegaly. No pericardial effusion. Calcific coronary artery disease  is identified. Moderate bilateral pleural effusions and compressive atelectasis are seen. Hepatobiliary: No focal liver abnormality is seen. No gallstones, gallbladder wall thickening, or biliary dilatation. Pancreas: Unremarkable. No pancreatic ductal dilatation or surrounding inflammatory changes. Spleen: Normal in size without focal abnormality. Adrenals/Urinary Tract: Adrenal glands are unremarkable. Kidneys are normal, without renal calculi, focal lesion, or hydronephrosis. The urinary bladder is incompletely distended. Its walls appear thickened.  Stomach/Bowel: The patient appears to be status post appendectomy. The stomach and small and large bowel are unremarkable. Vascular/Lymphatic: Atherosclerotic vascular disease is identified. No lymphadenopathy. Reproductive: Status post prostatectomy. Other: Mild mesenteric and body wall edema is present. No focal fluid collection. Three small fat containing midline ventral hernias are identified. Musculoskeletal: No acute abnormality. No lytic or sclerotic lesion. IMPRESSION: Moderate bilateral pleural effusions with associated compressive basilar atelectasis. Mild mesenteric and body wall edema most suggestive of volume overload. Cardiomegaly. Calcific aortic and coronary atherosclerosis. Status post prostatectomy. Small fat containing ventral hernias. Electronically Signed   By: Inge Rise M.D.   On: 04/07/2018 15:47    Scheduled Meds: . aspirin EC  81 mg Oral Daily  . atorvastatin  20 mg Oral QHS  . carvedilol  3.125 mg Oral BID  . citalopram  10 mg Oral Daily  . [START ON 04/09/2018] gabapentin  100 mg Oral Daily  . heparin  5,000 Units Subcutaneous Q8H  . pantoprazole  40 mg Oral Daily   Continuous Infusions: . sodium chloride 40 mL/hr at 04/08/18 1140  . cefTRIAXone (ROCEPHIN)  IV 1 g (04/08/18 0211)    Assessment/Plan:   1. Acute kidney injury on chronic kidney disease stage III.  Case discussed with Dr. Candiss Norse nephrology.  Gentle IV fluids today.  Reevaluate tomorrow.  Renal ultrasound pending.  On bladder scan the patient did have 300 cc in there.  Continue to monitor this closely on whether the patient needs a Foley catheter or not. 2. Acute cystitis on Rocephin.  Order a urine culture. 3. Chronic diastolic congestive heart failure.  Need to watch closely with IV fluid hydration.  The patient does have lower extremity edema and bilateral pleural effusions. 4. Hyperkalemia.  Given Veltassa. 5. Type 2 diabetes mellitus.  Put on sliding scale 6. Essential hypertension on  Coreg 7. Hyperlipidemia unspecified on atorvastatin 8. GERD on PPI  Code Status:     Code Status Orders  (From admission, onward)         Start     Ordered   04/07/18 2104  Full code  Continuous     04/07/18 2103        Code Status History    Date Active Date Inactive Code Status Order ID Comments User Context   09/19/2017 1054 09/22/2017 1638 Full Code [id number]  Saundra Shelling, MD Inpatient   06/28/2017 0416 06/30/2017 1830 Full Code [id number]  Harrie Foreman, MD Inpatient     Family Communication: Spoke with wife on the phone Disposition Plan: To be determined  Antibiotics:  Rocephin  Time spent: 28 minutes  [place]

## 2018-04-09 LAB — IRON AND TIBC
Iron: 26 ug/dL — ABNORMAL LOW (ref 45–182)
Saturation Ratios: 11 % — ABNORMAL LOW (ref 17.9–39.5)
TIBC: 235 ug/dL — ABNORMAL LOW (ref 250–450)
UIBC: 209 ug/dL

## 2018-04-09 LAB — CBC
HCT: 27.1 % — ABNORMAL LOW (ref 39.0–52.0)
Hemoglobin: 8.6 g/dL — ABNORMAL LOW (ref 13.0–17.0)
MCH: 29.8 pg (ref 26.0–34.0)
MCHC: 31.7 g/dL (ref 30.0–36.0)
MCV: 93.8 fL (ref 80.0–100.0)
PLATELETS: 197 10*3/uL (ref 150–400)
RBC: 2.89 MIL/uL — ABNORMAL LOW (ref 4.22–5.81)
RDW: 13.3 % (ref 11.5–15.5)
WBC: 5.9 10*3/uL (ref 4.0–10.5)
nRBC: 0 % (ref 0.0–0.2)

## 2018-04-09 LAB — GLUCOSE, CAPILLARY
Glucose-Capillary: 97 mg/dL (ref 70–99)
Glucose-Capillary: 96 mg/dL (ref 70–99)
Glucose-Capillary: 129 mg/dL — ABNORMAL HIGH (ref 70–99)
Glucose-Capillary: 109 mg/dL — ABNORMAL HIGH (ref 70–99)

## 2018-04-09 LAB — URINE CULTURE
Culture: NO GROWTH
Special Requests: NORMAL

## 2018-04-09 LAB — BASIC METABOLIC PANEL
Anion gap: 7 (ref 5–15)
BUN: 40 mg/dL — ABNORMAL HIGH (ref 8–23)
CO2: 26 mmol/L (ref 22–32)
CREATININE: 2.53 mg/dL — AB (ref 0.61–1.24)
Calcium: 9.2 mg/dL (ref 8.9–10.3)
Chloride: 107 mmol/L (ref 98–111)
GFR calc Af Amer: 28 mL/min — ABNORMAL LOW (ref >60)
GFR calc non Af Amer: 24 mL/min — ABNORMAL LOW (ref >60)
Glucose, Bld: 113 mg/dL — ABNORMAL HIGH (ref 70–99)
Potassium: 4.5 mmol/L (ref 3.5–5.1)
Sodium: 140 mmol/L (ref 135–145)

## 2018-04-09 LAB — HEMOGLOBIN A1C
Hgb A1c MFr Bld: 7 % — ABNORMAL HIGH (ref 4.8–5.6)
MEAN PLASMA GLUCOSE: 154.2 mg/dL

## 2018-04-09 LAB — FERRITIN: Ferritin: 223 ng/mL (ref 24–336)

## 2018-04-09 MED ORDER — GLUCERNA SHAKE PO LIQD
237.0000 mL | Freq: Two times a day (BID) | ORAL | Status: DC
  Administered 2018-04-09 (×2): 237 mL via ORAL

## 2018-04-09 NOTE — Progress Notes (Signed)
Pt spouse turned bed alarm off. Education given that he is a fall risk and other risks associated, but continued to refuse bed alarm

## 2018-04-09 NOTE — Care Management Note (Signed)
Case Management Note  Patient Details  Name: Travers Goodley MRN: [id number] Date of Birth: [birth year]  Subjective/Objective:                  Met with patient and family in the room to discuss DC plan Provided the [facility] list per CMS.gov and the patient chooses [facility], they have used them before Patient lives home with wife and grand daughters Has RW at home Need 3 in 1 Patient has Dr. Jeoffrey Massed as PCP Patient uses [facility] and can afford medications  Action/Plan:  [place] list provided and chose [facility] for PT  Expected Discharge Date:                  Expected Discharge Plan:     In-House Referral:     Discharge planning Services     Post Acute Care Choice:    Choice offered to:     DME Arranged:    DME Agency:     HH Arranged:    [place] Agency:     Status of Service:     If discussed at [employer], dates discussed:    Additional Comments:  Su Hilt, RN 04/09/2018, 11:14 AM

## 2018-04-09 NOTE — Consult Note (Signed)
[facility], [place] 04/09/18  Subjective:  Creatinine remains above baseline at 2.53. Currently sitting up in bed. Has bilateral pleural effusions but not complaining of significant shortness of breath.   Objective:  Vital signs in last 24 hours:  Temp:  [98.1 F (36.7 C)-98.9 F (37.2 C)] 98.1 F (36.7 C) (02/03 0825) Pulse Rate:  [83-93] 84 (02/03 0825) Resp:  [18] 18 (02/03 0825) BP: (96-106)/(55-62) 96/57 (02/03 0825) SpO2:  [96 %-97 %] 96 % (02/03 0825) Weight:  [83.4 kg] 83.4 kg (02/03 0836)  Weight change:  Filed Weights   04/07/18 2309 04/09/18 0836  Weight: 83.9 kg 83.4 kg    Intake/Output:    Intake/Output Summary (Last 24 hours) at 04/09/2018 1419 Last data filed at 04/09/2018 1013 Gross per 24 hour  Intake 1355 ml  Output 175 ml  Net 1180 ml     Physical Exam: General:  No acute distress, sitting up in bed  HEENT  moist oral mucous membranes, anicteric  Neck  supple  Pulm/lungs  decreased breath sounds at bases, no crackles  CVS/Heart  regular, soft systolic murmur  Abdomen:   Soft, nontender  Extremities:  1+ pitting edema bilaterally  Neurologic:  Alert, oriented.  Follows commands  Skin:  No acute rashes    Basic Metabolic Panel:  Recent Labs  Lab 04/07/18 1441 04/08/18 0354 04/09/18 0357  NA 136 137 140  K 5.7* 4.8 4.5  CL 101 104 107  CO2 26 27 26   GLUCOSE 203* 153* 113*  BUN 35* 38* 40*  CREATININE 2.32* 2.67* 2.53*  CALCIUM 9.6 9.0 9.2     CBC: Recent Labs  Lab 04/07/18 1441 04/09/18 0357  WBC 9.3 5.9  HGB 10.8* 8.6*  HCT 34.6* 27.1*  MCV 94.3 93.8  PLT 215 197     No results found for: HEPBSAG, HEPBSAB, HEPBIGM    Microbiology:  Recent Results (from the past 240 hour(s))  Urine Culture     Status: None   Collection Time: 04/07/18  5:59 PM  Result Value Ref Range Status   Specimen Description URINE, RANDOM  Final   Special Requests   Final    Normal Performed at [facility], [address] [place], [place] [address]    Culture NO GROWTH  Final   Report Status 04/09/2018 FINAL  Final    Coagulation Studies: No results for input(s): LABPROT, INR in the last 72 hours.  Urinalysis: Recent Labs    04/07/18 1759  COLORURINE AMBER*  LABSPEC 1.023  PHURINE 5.0  GLUCOSEU NEGATIVE  HGBUR NEGATIVE  BILIRUBINUR NEGATIVE  KETONESUR NEGATIVE  PROTEINUR 100*  NITRITE NEGATIVE  LEUKOCYTESUR SMALL*      Imaging: Ct Abdomen Pelvis Wo Contrast  Result Date: 04/07/2018 CLINICAL DATA:  Left flank and groin pain for 2 days. EXAM: CT ABDOMEN AND PELVIS WITHOUT CONTRAST TECHNIQUE: Multidetector CT imaging of the abdomen and pelvis was performed following the standard protocol without IV contrast. COMPARISON:  PET CT scan 04/04/2017. FINDINGS: Lower chest: There is cardiomegaly. No pericardial effusion. Calcific coronary artery disease is identified. Moderate bilateral pleural effusions and compressive atelectasis are seen. Hepatobiliary: No focal liver abnormality is seen. No gallstones, gallbladder wall thickening, or biliary dilatation. Pancreas: Unremarkable. No pancreatic ductal dilatation or surrounding inflammatory changes. Spleen: Normal in size without focal abnormality. Adrenals/Urinary Tract: Adrenal glands are unremarkable. Kidneys are normal, without renal calculi, focal lesion, or hydronephrosis. The urinary bladder is incompletely distended. Its walls appear thickened. Stomach/Bowel: The  patient appears to be status post appendectomy. The stomach and small and large bowel are unremarkable. Vascular/Lymphatic: Atherosclerotic vascular disease is identified. No lymphadenopathy. Reproductive: Status post prostatectomy. Other: Mild mesenteric and body wall edema is present. No focal fluid collection. Three small fat containing midline ventral hernias are identified. Musculoskeletal: No acute abnormality. No lytic or sclerotic lesion. IMPRESSION: Moderate bilateral  pleural effusions with associated compressive basilar atelectasis. Mild mesenteric and body wall edema most suggestive of volume overload. Cardiomegaly. Calcific aortic and coronary atherosclerosis. Status post prostatectomy. Small fat containing ventral hernias. Electronically Signed   By: Inge Rise M.D.   On: 04/07/2018 15:47   US Renal  Result Date: 04/08/2018 CLINICAL DATA:  Acute kidney injury EXAM: RENAL / URINARY TRACT ULTRASOUND COMPLETE COMPARISON:  None. FINDINGS: Right Kidney: Renal measurements: 5.8 x 4.0 x 4.8 cm = volume: 98.9 mL . Echogenicity within normal limits. No mass or hydronephrosis visualized. Mild right perinephric fluid is nonspecific. Left Kidney: Renal measurements: 10.0 x 5.6 x 9.5 cm = volume: 130.6 mL. Echogenicity within normal limits. No mass or hydronephrosis visualized. Bladder: Mild diffuse bladder wall thickening. IMPRESSION: Mild right perinephric fluid is nonspecific. Diffuse bladder wall thickening is noted. Correlate with urinalysis and clinical factors as for the need for cystoscopy. An inflammatory process is not excluded. Electronically Signed   By: Marybelle Killings M.D.   On: 04/08/2018 13:58     Medications:   . cefTRIAXone (ROCEPHIN)  IV 1 g (04/09/18 0045)   . aspirin EC  81 mg Oral Daily  . atorvastatin  20 mg Oral QHS  . citalopram  10 mg Oral Daily  . feeding supplement (GLUCERNA SHAKE)  237 mL Oral BID BM  . gabapentin  100 mg Oral Daily  . heparin  5,000 Units Subcutaneous Q8H  . insulin aspart  0-5 Units Subcutaneous QHS  . insulin aspart  0-9 Units Subcutaneous TID WC  . pantoprazole  40 mg Oral Daily   acetaminophen **OR** acetaminophen, albuterol, insulin glargine, ondansetron **OR** ondansetron (ZOFRAN) IV  Assessment/ Plan:  78 y.o. male  hyperlipidemia, diabetes mellitus type 2 greater than 20 years, chronic diastolic heart failure, GERD, anemia chronic kidney disease, prostate cancer, chronic low back pain, history of left lower  extremity DVT   Acute kidney injury.  Cause is unclear. -Possibly related to hemodynamic changes versus urinary retention -Renal ultrasound is negative for hydronephrosis, bladder wall thickening noted. -Patient was on hydration but taken off.  Hold diuretics and IV fluid hydration for now and follow renal parameters daily for now.  Chronic kidney disease stage III.  Baseline creatinine 1.67/GFR 45 from October 12, 2017 -Likely secondary to atherosclerosis, hypertension and diabetes  Proteinuria Protein creatinine ratio was found to be 1.2.  We will continue to monitor as an outpatient.  Hyperkalemia -Treated with Veltassa, potassium down to 4.5 now.  DM-2 with CKD Hemoglobin A1c acceptable at 7.0.   LOS: 2 Jame Morrell 2/3/20202:19 PM  [place], [place]  Note: This note was prepared with Dragon dictation. Any transcription errors are unintentional

## 2018-04-09 NOTE — Plan of Care (Signed)

## 2018-04-09 NOTE — Progress Notes (Addendum)
Patient ID: Jesse Dickson, male   DOB: 06/27/1940, 78 y.o.   MRN: [id number]  [facility] PROGRESS NOTE  Dereke Neumann TDD:[id number] DOB: April 08, 1940 DOA: 04/07/2018 PCP: Cletis Athens, MD  HPI/Subjective: Patient states his abdominal pain is gone.  Wife states he had a couple episodes of diarrhea.  Feeling weak.  States his breathing is okay.  Objective: Vitals:   04/08/18 2326 04/09/18 0825  BP: (!) 98/55 (!) 96/57  Pulse: 93 84  Resp: 18 18  Temp: 98.9 F (37.2 C) 98.1 F (36.7 C)  SpO2: 97% 96%    Filed Weights   04/07/18 2309 04/09/18 0836  Weight: 83.9 kg 83.4 kg    ROS: Review of Systems  Constitutional: Negative for chills and fever.  Eyes: Negative for blurred vision.  Respiratory: Negative for cough and shortness of breath.   Cardiovascular: Negative for chest pain.  Gastrointestinal: Positive for diarrhea. Negative for abdominal pain, constipation, nausea and vomiting.  Genitourinary: Negative for dysuria.  Musculoskeletal: Negative for joint pain.  Neurological: Negative for dizziness and headaches.   Exam: Physical Exam  Constitutional: He is oriented to person, place, and time.  HENT:  Nose: No mucosal edema.  Mouth/Throat: No oropharyngeal exudate or posterior oropharyngeal edema.  Eyes: Pupils are equal, round, and reactive to light. Conjunctivae, EOM and lids are normal.  Neck: No JVD present. Carotid bruit is not present. No edema present. No thyroid mass and no thyromegaly present.  Cardiovascular: S1 normal and S2 normal. Exam reveals no gallop.  No murmur heard. Pulses:      Dorsalis pedis pulses are 2+ on the right side and 2+ on the left side.  Respiratory: No respiratory distress. He has decreased breath sounds in the right lower field and the left lower field. He has no wheezes. He has no rhonchi. He has rales in the right lower field and the left lower field.  GI: Soft. Bowel sounds are normal. There is no abdominal tenderness.   Musculoskeletal:     Right ankle: He exhibits swelling.     Left ankle: He exhibits swelling.  Lymphadenopathy:    He has no cervical adenopathy.  Neurological: He is alert and oriented to person, place, and time. No cranial nerve deficit.  Skin: Skin is warm. No rash noted. Nails show no clubbing.  Psychiatric: He has a normal mood and affect.      Data Reviewed: Basic Metabolic Panel: Recent Labs  Lab 04/07/18 1441 04/08/18 0354 04/09/18 0357  NA 136 137 140  K 5.7* 4.8 4.5  CL 101 104 107  CO2 26 27 26   GLUCOSE 203* 153* 113*  BUN 35* 38* 40*  CREATININE 2.32* 2.67* 2.53*  CALCIUM 9.6 9.0 9.2   Liver Function Tests: Recent Labs  Lab 04/07/18 1441  AST 28  ALT 18  ALKPHOS 138*  BILITOT 1.6*  PROT 7.6  ALBUMIN 4.3   CBC: Recent Labs  Lab 04/07/18 1441 04/09/18 0357  WBC 9.3 5.9  HGB 10.8* 8.6*  HCT 34.6* 27.1*  MCV 94.3 93.8  PLT 215 197   BNP (last 3 results) Recent Labs    06/26/17 1621 06/28/17 0023 09/19/17 0612  BNP 332.0* 362.0* 313.0*      Studies: Ct Abdomen Pelvis Wo Contrast  Result Date: 04/07/2018 CLINICAL DATA:  Left flank and groin pain for 2 days. EXAM: CT ABDOMEN AND PELVIS WITHOUT CONTRAST TECHNIQUE: Multidetector CT imaging of the abdomen and pelvis was performed following the standard protocol without IV contrast. COMPARISON:  PET CT scan 04/04/2017. FINDINGS: Lower chest: There is cardiomegaly. No pericardial effusion. Calcific coronary artery disease is identified. Moderate bilateral pleural effusions and compressive atelectasis are seen. Hepatobiliary: No focal liver abnormality is seen. No gallstones, gallbladder wall thickening, or biliary dilatation. Pancreas: Unremarkable. No pancreatic ductal dilatation or surrounding inflammatory changes. Spleen: Normal in size without focal abnormality. Adrenals/Urinary Tract: Adrenal glands are unremarkable. Kidneys are normal, without renal calculi, focal lesion, or hydronephrosis. The  urinary bladder is incompletely distended. Its walls appear thickened. Stomach/Bowel: The patient appears to be status post appendectomy. The stomach and small and large bowel are unremarkable. Vascular/Lymphatic: Atherosclerotic vascular disease is identified. No lymphadenopathy. Reproductive: Status post prostatectomy. Other: Mild mesenteric and body wall edema is present. No focal fluid collection. Three small fat containing midline ventral hernias are identified. Musculoskeletal: No acute abnormality. No lytic or sclerotic lesion. IMPRESSION: Moderate bilateral pleural effusions with associated compressive basilar atelectasis. Mild mesenteric and body wall edema most suggestive of volume overload. Cardiomegaly. Calcific aortic and coronary atherosclerosis. Status post prostatectomy. Small fat containing ventral hernias. Electronically Signed   By: Inge Rise M.D.   On: 04/07/2018 15:47   US Renal  Result Date: 04/08/2018 CLINICAL DATA:  Acute kidney injury EXAM: RENAL / URINARY TRACT ULTRASOUND COMPLETE COMPARISON:  None. FINDINGS: Right Kidney: Renal measurements: 5.8 x 4.0 x 4.8 cm = volume: 98.9 mL . Echogenicity within normal limits. No mass or hydronephrosis visualized. Mild right perinephric fluid is nonspecific. Left Kidney: Renal measurements: 10.0 x 5.6 x 9.5 cm = volume: 130.6 mL. Echogenicity within normal limits. No mass or hydronephrosis visualized. Bladder: Mild diffuse bladder wall thickening. IMPRESSION: Mild right perinephric fluid is nonspecific. Diffuse bladder wall thickening is noted. Correlate with urinalysis and clinical factors as for the need for cystoscopy. An inflammatory process is not excluded. Electronically Signed   By: Marybelle Killings M.D.   On: 04/08/2018 13:58    Scheduled Meds: . aspirin EC  81 mg Oral Daily  . atorvastatin  20 mg Oral QHS  . citalopram  10 mg Oral Daily  . feeding supplement (GLUCERNA SHAKE)  237 mL Oral BID BM  . gabapentin  100 mg Oral Daily   . heparin  5,000 Units Subcutaneous Q8H  . insulin aspart  0-5 Units Subcutaneous QHS  . insulin aspart  0-9 Units Subcutaneous TID WC  . pantoprazole  40 mg Oral Daily   Continuous Infusions: . cefTRIAXone (ROCEPHIN)  IV 1 g (04/09/18 0045)    Assessment/Plan:   1. Acute kidney injury on chronic kidney disease stage III.  Case discussed with Dr. Holley Raring nephrology.  Hold any further IV fluids and Lasix today and try to let the body equilibrate.  Check BMP daily. 2. Acute cystitis on Rocephin.  So far urine culture negative. 3. Chronic diastolic congestive heart failure.  Need to watch closely at this point..  The patient does have lower extremity edema and bilateral pleural effusions.  TED hose ordered. 4. Hyperkalemia.  Given Veltassa.  Potassium has improved. 5. Type 2 diabetes mellitus.  Put on sliding scale 6. Essential hypertension on Coreg 7. Hyperlipidemia unspecified on atorvastatin 8. GERD on PPI 9. Anemia.  Continue to watch hemoglobin.  Hemoglobin 8.6 today after hydration yesterday.  Nephrology to consider Epogen.  Code Status:     Code Status Orders  (From admission, onward)         Start     Ordered   04/07/18 2104  Full code  Continuous  04/07/18 2103        Code Status History    Date Active Date Inactive Code Status Order ID Comments User Context   09/19/2017 1054 09/22/2017 1638 Full Code [id number]  Saundra Shelling, MD Inpatient   06/28/2017 0416 06/30/2017 1830 Full Code [id number]  Harrie Foreman, MD Inpatient     Family Communication: Spoke with wife and son at the bedside Disposition Plan: To be determined  Antibiotics:  Rocephin  Time spent: 28 minutes  [place]

## 2018-04-10 ENCOUNTER — Inpatient Hospital Stay: Payer: Medicare HMO

## 2018-04-10 LAB — HEMOGLOBIN: Hemoglobin: 8.5 g/dL — ABNORMAL LOW (ref 13.0–17.0)

## 2018-04-10 LAB — VITAMIN B12: Vitamin B-12: 604 pg/mL (ref 180–914)

## 2018-04-10 LAB — GLUCOSE, CAPILLARY
Glucose-Capillary: 87 mg/dL (ref 70–99)
Glucose-Capillary: 117 mg/dL — ABNORMAL HIGH (ref 70–99)
Glucose-Capillary: 142 mg/dL — ABNORMAL HIGH (ref 70–99)
Glucose-Capillary: 126 mg/dL — ABNORMAL HIGH (ref 70–99)

## 2018-04-10 LAB — BASIC METABOLIC PANEL
Anion gap: 6 (ref 5–15)
BUN: 41 mg/dL — AB (ref 8–23)
CO2: 25 mmol/L (ref 22–32)
Calcium: 9.1 mg/dL (ref 8.9–10.3)
Chloride: 107 mmol/L (ref 98–111)
Creatinine, Ser: 2.44 mg/dL — ABNORMAL HIGH (ref 0.61–1.24)
GFR calc Af Amer: 29 mL/min — ABNORMAL LOW (ref >60)
GFR calc non Af Amer: 25 mL/min — ABNORMAL LOW (ref >60)
Glucose, Bld: 113 mg/dL — ABNORMAL HIGH (ref 70–99)
POTASSIUM: 4.6 mmol/L (ref 3.5–5.1)
Sodium: 138 mmol/L (ref 135–145)

## 2018-04-10 MED ORDER — GLUCERNA SHAKE PO LIQD
237.0000 mL | Freq: Three times a day (TID) | ORAL | Status: DC
  Administered 2018-04-10 – 2018-04-13 (×8): 237 mL via ORAL

## 2018-04-10 MED ORDER — GUAIFENESIN-DM 100-10 MG/5ML PO SYRP: 5.0000 mL | ORAL_SOLUTION | ORAL | Status: DC | PRN

## 2018-04-10 MED ORDER — FAMOTIDINE IN NACL 20-0.9 MG/50ML-% IV SOLN
20.0000 mg | Freq: Every day | INTRAVENOUS | Status: DC
  Administered 2018-04-10: 20 mg via INTRAVENOUS
  Filled 2018-04-10: qty 50

## 2018-04-10 NOTE — Care Management Important Message (Signed)
Important Message  Patient Details  Name: Jesse Dickson MRN: [id number] Date of Birth: [birth year]   Medicare Important Message Given:  Yes    Juliann Pulse A Meshelle Holness 04/10/2018, 10:41 AM

## 2018-04-10 NOTE — Progress Notes (Signed)
Physical Therapy Treatment Patient Details Name: Jesse Dickson MRN: [id number] DOB: [birth year] Today's Date: 04/10/2018    History of Present Illness pt is a 78 y.o M admitted on 04/07/2018 with dx of acute on chronic renal failure. known history of hypertension, chronic diastolic congestive heart failure, hypercholesterolemia, type II diabetes, CKD stage III. pt went to ED reporting R flank pain and was dx with a UTI and dehydration.     PT Comments    Pt agreeable to PT; reports 7/10 pain in LB at rest that increases with ambulation. Pt participates in out of bed for short ambulation, but declines further out of room due to increasing pain. Pt ambulates with guarded posture. Pt received up in chair and educated on and performed limited lower extremity exercises. Continue PT to progress activity tolerance and strength without aggravating pain symptoms to improve postures and all functional mobility to allow for an optimal, safe return home.   Follow Up Recommendations  Home health PT     Equipment Recommendations  None recommended by PT    Recommendations for Other Services       Precautions / Restrictions Precautions Precautions: None Restrictions Weight Bearing Restrictions: No    Mobility  Bed Mobility Overal bed mobility: Modified Independent             General bed mobility comments: Increased time  Transfers Overall transfer level: Modified independent Equipment used: None             General transfer comment: Guarded to rise; moves slowly  Ambulation/Gait Ambulation/Gait assistance: Supervision Gait Distance (Feet): 20 Feet Assistive device: 1 person hand held assist Gait Pattern/deviations: Decreased stride length     General Gait Details: small steps with guarded body positiioning/rigid through trunk and mildly flexed forward. Pt notes pain increases with ambulation.    Stairs             Wheelchair Mobility    Modified Rankin (Stroke  Patients Only)       Balance                                            Cognition Arousal/Alertness: Awake/alert Behavior During Therapy: WFL for tasks assessed/performed Overall Cognitive Status: Within Functional Limits for tasks assessed                                        Exercises General Exercises - Lower Extremity Ankle Circles/Pumps: AROM;Both;15 reps;Seated Quad Sets: Strengthening;Both;10 reps Gluteal Sets: Strengthening;Both;10 reps Heel Slides: AROM;Both;10 reps(long sit, limited range)    General Comments        Pertinent Vitals/Pain Pain Assessment: 0-10 Pain Score: 7  Pain Location: Low back Pain Descriptors / Indicators: Aching;Constant;Guarding Pain Intervention(s): Limited activity within patient's tolerance;Monitored during session;Repositioned    Home Living                      Prior Function            PT Goals (current goals can now be found in the care plan section) Progress towards PT goals: Progressing toward goals    Frequency    Min 2X/week      PT Plan Current plan remains appropriate    Co-evaluation  AM-PAC PT "6 Clicks" Mobility   Outcome Measure  Help needed turning from your back to your side while in a flat bed without using bedrails?: None Help needed moving from lying on your back to sitting on the side of a flat bed without using bedrails?: None Help needed moving to and from a bed to a chair (including a wheelchair)?: A Little Help needed standing up from a chair using your arms (e.g., wheelchair or bedside chair)?: A Little Help needed to walk in hospital room?: A Little Help needed climbing 3-5 steps with a railing? : A Lot 6 Click Score: 19    End of Session Equipment Utilized During Treatment: Gait belt Activity Tolerance: Patient limited by pain Patient left: in chair;with call bell/phone within reach;with chair alarm set   PT Visit  Diagnosis: Unsteadiness on feet (R26.81);Pain;Muscle weakness (generalized) (M62.81);Difficulty in walking, not elsewhere classified (R26.2) Pain - Right/Left: Right     Time: 5615-3794 PT Time Calculation (min) (ACUTE ONLY): 23 min  Charges:  $Gait Training: 8-22 mins $Therapeutic Exercise: 8-22 mins                      Larae Grooms, PTA 04/10/2018, 4:16 PM

## 2018-04-10 NOTE — Progress Notes (Signed)
PT Cancellation Note  Patient Details Name: Jesse Dickson MRN: [id number] DOB: 12/01/1940   Cancelled Treatment:    Reason Eval/Treat Not Completed: Other (comment)   Offered and encouraged session.  Pt refused stating he was feeling poorly today.  Will continue as appropriate.   Chesley Noon 04/10/2018, 10:24 AM

## 2018-04-10 NOTE — Consult Note (Signed)
[facility], [place] 04/10/18  Subjective:  Renal function has improved slightly. Creatinine down to 2.4. Patient resting comfortably in bed at the moment. Still has a poor appetite.   Objective:  Vital signs in last 24 hours:  Temp:  [97.9 F (36.6 C)-99 F (37.2 C)] 97.9 F (36.6 C) (02/04 0730) Pulse Rate:  [79-82] 79 (02/04 0730) Resp:  [16-20] 20 (02/04 0730) BP: (110-126)/(66-78) 126/78 (02/04 0730) SpO2:  [94 %-97 %] 97 % (02/04 0730) Weight:  [88.5 kg] 88.5 kg (02/04 0617)  Weight change:  Filed Weights   04/07/18 2309 04/09/18 0836 04/10/18 0617  Weight: 83.9 kg 83.4 kg 88.5 kg    Intake/Output:    Intake/Output Summary (Last 24 hours) at 04/10/2018 1521 Last data filed at 04/10/2018 1309 Gross per 24 hour  Intake 837 ml  Output 450 ml  Net 387 ml     Physical Exam: General:  No acute distress, sitting up in bed  HEENT  moist oral mucous membranes, anicteric  Neck  supple  Pulm/lungs  decreased breath sounds at bases, no crackles  CVS/Heart  regular, soft systolic murmur  Abdomen:   Soft, nontender  Extremities:  1+ pitting edema bilaterally  Neurologic:  Alert, oriented.  Follows commands  Skin:  No acute rashes    Basic Metabolic Panel:  Recent Labs  Lab 04/07/18 1441 04/08/18 0354 04/09/18 0357 04/10/18 0252  NA 136 137 140 138  K 5.7* 4.8 4.5 4.6  CL 101 104 107 107  CO2 26 27 26 25   GLUCOSE 203* 153* 113* 113*  BUN 35* 38* 40* 41*  CREATININE 2.32* 2.67* 2.53* 2.44*  CALCIUM 9.6 9.0 9.2 9.1     CBC: Recent Labs  Lab 04/07/18 1441 04/09/18 0357 04/10/18 0252  WBC 9.3 5.9  --   HGB 10.8* 8.6* 8.5*  HCT 34.6* 27.1*  --   MCV 94.3 93.8  --   PLT 215 197  --      No results found for: HEPBSAG, HEPBSAB, HEPBIGM    Microbiology:  Recent Results (from the past 240 hour(s))  Urine Culture     Status: None   Collection Time: 04/07/18  5:59 PM  Result Value Ref Range Status   Specimen Description  URINE, RANDOM  Final   Special Requests   Final    Normal Performed at [facility], [address] [place], [place] [address]    Culture NO GROWTH  Final   Report Status 04/09/2018 FINAL  Final    Coagulation Studies: No results for input(s): LABPROT, INR in the last 72 hours.  Urinalysis: Recent Labs    04/07/18 1759  COLORURINE AMBER*  LABSPEC 1.023  PHURINE 5.0  GLUCOSEU NEGATIVE  HGBUR NEGATIVE  BILIRUBINUR NEGATIVE  KETONESUR NEGATIVE  PROTEINUR 100*  NITRITE NEGATIVE  LEUKOCYTESUR SMALL*      Imaging: US Venous Img Lower Unilateral Right  Result Date: 04/10/2018 CLINICAL DATA:  Right lower extremity pain and edema. History of prior DVT and prostate cancer. Evaluate for acute or chronic DVT. EXAM: RIGHT LOWER EXTREMITY VENOUS DOPPLER ULTRASOUND TECHNIQUE: Gray-scale sonography with graded compression, as well as color Doppler and duplex ultrasound were performed to evaluate the lower extremity deep venous systems from the level of the common femoral vein and including the common femoral, femoral, profunda femoral, popliteal and calf veins including the posterior tibial, peroneal and gastrocnemius veins when visible. The superficial great saphenous vein was also interrogated. Spectral Doppler was utilized to evaluate  flow at rest and with distal augmentation maneuvers in the common femoral, femoral and popliteal veins. COMPARISON:  None. FINDINGS: Contralateral Common Femoral Vein: Respiratory phasicity is normal and symmetric with the symptomatic side. No evidence of thrombus. Normal compressibility. Common Femoral Vein: No evidence of thrombus. Normal compressibility, respiratory phasicity and response to augmentation. Saphenofemoral Junction: No evidence of thrombus. Normal compressibility and flow on color Doppler imaging. Profunda Femoral Vein: No evidence of thrombus. Normal compressibility and flow on color Doppler imaging. Femoral Vein: Minimal echogenic wall  thickening involving the proximal aspect of the right femoral vein (images 15 and 16). No evidence of thrombus. Normal compressibility, respiratory phasicity and response to augmentation. Popliteal Vein: No evidence of thrombus. Normal compressibility, respiratory phasicity and response to augmentation. Calf Veins: No evidence of thrombus. Normal compressibility and flow on color Doppler imaging. Superficial Great Saphenous Vein: No evidence of thrombus. Normal compressibility. Venous Reflux:  None. Other Findings:  None. IMPRESSION: 1. No evidence of acute DVT within the right lower extremity. 2. Suspected minimal amount of mural calcification involving the proximal aspect of the right femoral vein as could be seen as a sequela prior DVT. Electronically Signed   By: Sandi Mariscal M.D.   On: 04/10/2018 11:35     Medications:   . cefTRIAXone (ROCEPHIN)  IV 1 g (04/09/18 2240)  . famotidine (PEPCID) IV 20 mg (04/10/18 1137)   . aspirin EC  81 mg Oral Daily  . atorvastatin  20 mg Oral QHS  . citalopram  10 mg Oral Daily  . feeding supplement (GLUCERNA SHAKE)  237 mL Oral TID BM  . gabapentin  100 mg Oral Daily  . heparin  5,000 Units Subcutaneous Q8H  . insulin aspart  0-5 Units Subcutaneous QHS  . insulin aspart  0-9 Units Subcutaneous TID WC  . pantoprazole  40 mg Oral Daily   acetaminophen **OR** acetaminophen, albuterol, guaiFENesin-dextromethorphan, insulin glargine, ondansetron **OR** ondansetron (ZOFRAN) IV  Assessment/ Plan:  78 y.o. male  hyperlipidemia, diabetes mellitus type 2 greater than 20 years, chronic diastolic heart failure, GERD, anemia chronic kidney disease, prostate cancer, chronic low back pain, history of left lower extremity DVT   Acute kidney injury.  Cause is unclear. -Possibly related to hemodynamic changes versus urinary retention Renal ultrasound is negative for hydronephrosis, bladder wall thickening noted. -Renal function slightly improved.  Creatinine down to  2.4.  Continue to hold IV fluid hydration and diuretics.  Chronic kidney disease stage III.  Baseline creatinine 1.67/GFR 45 from October 12, 2017 -Likely secondary to atherosclerosis, hypertension and diabetes  Proteinuria Protein creatinine ratio was found to be 1.2.  We will continue to monitor as an outpatient.  Consider adding back an ARB as an outpatient.  Hyperkalemia -Treated with Veltassa, potassium down to 4.6 now.  DM-2 with CKD Hemoglobin A1c acceptable at 7.0.   LOS: 3 Kaelah Hayashi 2/4/20203:21 PM  [place], [place]  Note: This note was prepared with Dragon dictation. Any transcription errors are unintentional

## 2018-04-10 NOTE — Progress Notes (Signed)
Patient ID: Jesse Dickson, male   DOB: 1940-08-17, 78 y.o.   MRN: [id number]  [employer] PROGRESS NOTE  Jesse Dickson YTK:[id number] DOB: 24-Aug-1940 DOA: 04/07/2018 PCP: Cletis Athens, MD  HPI/Subjective: Patient still feeling very weak.  Poor appetite.  Diarrhea subsided.  Testicle pain better.  Objective: Vitals:   04/09/18 2318 04/10/18 0730  BP: 123/69 126/78  Pulse: 82 79  Resp: 19 20  Temp: 98.7 F (37.1 C) 97.9 F (36.6 C)  SpO2: 95% 97%    Filed Weights   04/07/18 2309 04/09/18 0836 04/10/18 0617  Weight: 83.9 kg 83.4 kg 88.5 kg    ROS: Review of Systems  Constitutional: Positive for malaise/fatigue. Negative for chills and fever.  Eyes: Negative for blurred vision.  Respiratory: Negative for cough and shortness of breath.   Cardiovascular: Negative for chest pain.  Gastrointestinal: Negative for abdominal pain, constipation, diarrhea, nausea and vomiting.  Genitourinary: Negative for dysuria.  Musculoskeletal: Negative for joint pain.  Neurological: Negative for dizziness and headaches.   Exam: Physical Exam  Constitutional: He is oriented to person, place, and time.  HENT:  Nose: No mucosal edema.  Mouth/Throat: No oropharyngeal exudate or posterior oropharyngeal edema.  Eyes: Pupils are equal, round, and reactive to light. Conjunctivae, EOM and lids are normal.  Neck: No JVD present. Carotid bruit is not present. No edema present. No thyroid mass and no thyromegaly present.  Cardiovascular: S1 normal and S2 normal. Exam reveals no gallop.  No murmur heard. Pulses:      Dorsalis pedis pulses are 2+ on the right side and 2+ on the left side.  Respiratory: No respiratory distress. He has decreased breath sounds in the right lower field and the left lower field. He has no wheezes. He has no rhonchi. He has rales in the right lower field and the left lower field.  GI: Soft. Bowel sounds are normal. There is no abdominal tenderness.   Musculoskeletal:     Right ankle: He exhibits swelling.     Left ankle: He exhibits swelling.  Lymphadenopathy:    He has no cervical adenopathy.  Neurological: He is alert and oriented to person, place, and time. No cranial nerve deficit.  Skin: Skin is warm. No rash noted. Nails show no clubbing.  Psychiatric: He has a normal mood and affect.      Data Reviewed: Basic Metabolic Panel: Recent Labs  Lab 04/07/18 1441 04/08/18 0354 04/09/18 0357 04/10/18 0252  NA 136 137 140 138  K 5.7* 4.8 4.5 4.6  CL 101 104 107 107  CO2 26 27 26 25   GLUCOSE 203* 153* 113* 113*  BUN 35* 38* 40* 41*  CREATININE 2.32* 2.67* 2.53* 2.44*  CALCIUM 9.6 9.0 9.2 9.1   Liver Function Tests: Recent Labs  Lab 04/07/18 1441  AST 28  ALT 18  ALKPHOS 138*  BILITOT 1.6*  PROT 7.6  ALBUMIN 4.3   CBC: Recent Labs  Lab 04/07/18 1441 04/09/18 0357 04/10/18 0252  WBC 9.3 5.9  --   HGB 10.8* 8.6* 8.5*  HCT 34.6* 27.1*  --   MCV 94.3 93.8  --   PLT 215 197  --    BNP (last 3 results) Recent Labs    06/26/17 1621 06/28/17 0023 09/19/17 0612  BNP 332.0* 362.0* 313.0*      Studies: US Venous Img Lower Unilateral Right  Result Date: 04/10/2018 CLINICAL DATA:  Right lower extremity pain and edema. History of prior DVT and prostate cancer. Evaluate for acute or  chronic DVT. EXAM: RIGHT LOWER EXTREMITY VENOUS DOPPLER ULTRASOUND TECHNIQUE: Gray-scale sonography with graded compression, as well as color Doppler and duplex ultrasound were performed to evaluate the lower extremity deep venous systems from the level of the common femoral vein and including the common femoral, femoral, profunda femoral, popliteal and calf veins including the posterior tibial, peroneal and gastrocnemius veins when visible. The superficial great saphenous vein was also interrogated. Spectral Doppler was utilized to evaluate flow at rest and with distal augmentation maneuvers in the common femoral, femoral and popliteal  veins. COMPARISON:  None. FINDINGS: Contralateral Common Femoral Vein: Respiratory phasicity is normal and symmetric with the symptomatic side. No evidence of thrombus. Normal compressibility. Common Femoral Vein: No evidence of thrombus. Normal compressibility, respiratory phasicity and response to augmentation. Saphenofemoral Junction: No evidence of thrombus. Normal compressibility and flow on color Doppler imaging. Profunda Femoral Vein: No evidence of thrombus. Normal compressibility and flow on color Doppler imaging. Femoral Vein: Minimal echogenic wall thickening involving the proximal aspect of the right femoral vein (images 15 and 16). No evidence of thrombus. Normal compressibility, respiratory phasicity and response to augmentation. Popliteal Vein: No evidence of thrombus. Normal compressibility, respiratory phasicity and response to augmentation. Calf Veins: No evidence of thrombus. Normal compressibility and flow on color Doppler imaging. Superficial Great Saphenous Vein: No evidence of thrombus. Normal compressibility. Venous Reflux:  None. Other Findings:  None. IMPRESSION: 1. No evidence of acute DVT within the right lower extremity. 2. Suspected minimal amount of mural calcification involving the proximal aspect of the right femoral vein as could be seen as a sequela prior DVT. Electronically Signed   By: Sandi Mariscal M.D.   On: 04/10/2018 11:35    Scheduled Meds: . aspirin EC  81 mg Oral Daily  . atorvastatin  20 mg Oral QHS  . citalopram  10 mg Oral Daily  . feeding supplement (GLUCERNA SHAKE)  237 mL Oral TID BM  . gabapentin  100 mg Oral Daily  . heparin  5,000 Units Subcutaneous Q8H  . insulin aspart  0-5 Units Subcutaneous QHS  . insulin aspart  0-9 Units Subcutaneous TID WC  . pantoprazole  40 mg Oral Daily   Continuous Infusions: . cefTRIAXone (ROCEPHIN)  IV 1 g (04/09/18 2240)  . famotidine (PEPCID) IV 20 mg (04/10/18 1137)    Assessment/Plan:   1. Acute kidney injury  on chronic kidney disease stage III.  Case discussed with Dr. Holley Raring nephrology.  Continue to try to let the body equilibrate by holding fluids and Lasix.  Check BMP daily. 2. Epididymitis on Rocephin.  So far urine culture negative. 3. Chronic diastolic congestive heart failure.  Need to watch closely at this point..  The patient does have lower extremity edema and bilateral pleural effusions.  TED hose ordered. 4. Hyperkalemia.  Given Veltassa.  Potassium has improved. 5. Type 2 diabetes mellitus.  Put on sliding scale 6. Essential hypertension on Coreg 7. Hyperlipidemia unspecified on atorvastatin 8. GERD on Pepcid and Protonix 9. Anemia.  Continue to watch hemoglobin.  Hemoglobin 8.6 today after hydration yesterday.  Nephrology to consider Epogen. 10. Right leg pain.  Likely sciatica.  Ultrasound negative for DVT.  Nurse able to Doppler pulses. 11. Poor appetite.  Patient drinking some Glucerna's.  Code Status:     Code Status Orders  (From admission, onward)         Start     Ordered   04/07/18 2104  Full code  Continuous     04/07/18  2103        Code Status History    Date Active Date Inactive Code Status Order ID Comments User Context   09/19/2017 1054 09/22/2017 1638 Full Code [id number]  Saundra Shelling, MD Inpatient   06/28/2017 0416 06/30/2017 1830 Full Code [id number]  Harrie Foreman, MD Inpatient     Family Communication: Spoke with wife and son at the bedside.  Spoke with niece on the phone. Disposition Plan: To be determined  Antibiotics:  Rocephin  Time spent: 28 minutes  [place]

## 2018-04-11 ENCOUNTER — Inpatient Hospital Stay: Payer: Medicare HMO

## 2018-04-11 LAB — GLUCOSE, CAPILLARY
Glucose-Capillary: 117 mg/dL — ABNORMAL HIGH (ref 70–99)
Glucose-Capillary: 119 mg/dL — ABNORMAL HIGH (ref 70–99)
Glucose-Capillary: 170 mg/dL — ABNORMAL HIGH (ref 70–99)
Glucose-Capillary: 143 mg/dL — ABNORMAL HIGH (ref 70–99)

## 2018-04-11 LAB — BASIC METABOLIC PANEL
Anion gap: 7 (ref 5–15)
BUN: 41 mg/dL — ABNORMAL HIGH (ref 8–23)
CALCIUM: 9.3 mg/dL (ref 8.9–10.3)
CO2: 24 mmol/L (ref 22–32)
Chloride: 108 mmol/L (ref 98–111)
Creatinine, Ser: 2.3 mg/dL — ABNORMAL HIGH (ref 0.61–1.24)
GFR calc Af Amer: 31 mL/min — ABNORMAL LOW (ref >60)
GFR, EST NON AFRICAN AMERICAN: 27 mL/min — AB (ref >60)
GLUCOSE: 134 mg/dL — AB (ref 70–99)
Potassium: 4.8 mmol/L (ref 3.5–5.1)
Sodium: 139 mmol/L (ref 135–145)

## 2018-04-11 LAB — BODY FLUID CELL COUNT WITH DIFFERENTIAL
Eos, Fluid: 0 %
Lymphs, Fluid: 75 %
Monocyte-Macrophage-Serous Fluid: 0 %
Neutrophil Count, Fluid: 25 %
Total Nucleated Cell Count, Fluid: 491 cu mm

## 2018-04-11 LAB — GLUCOSE, PLEURAL OR PERITONEAL FLUID: GLUCOSE FL: 148 mg/dL

## 2018-04-11 LAB — LACTATE DEHYDROGENASE, PLEURAL OR PERITONEAL FLUID: LD, Fluid: 64 U/L — ABNORMAL HIGH (ref 3–23)

## 2018-04-11 LAB — PROTEIN, PLEURAL OR PERITONEAL FLUID: Total protein, fluid: <3 g/dL

## 2018-04-11 MED ORDER — AZITHROMYCIN 250 MG PO TABS
250.0000 mg | ORAL_TABLET | Freq: Every day | ORAL | Status: DC
  Administered 2018-04-12: 250 mg via ORAL
  Filled 2018-04-11: qty 1

## 2018-04-11 MED ORDER — FAMOTIDINE 20 MG PO TABS
20.0000 mg | ORAL_TABLET | Freq: Every day | ORAL | Status: DC
  Administered 2018-04-11 – 2018-04-13 (×3): 20 mg via ORAL
  Filled 2018-04-11 (×2): qty 1

## 2018-04-11 MED ORDER — CITALOPRAM HYDROBROMIDE 20 MG PO TABS
20.0000 mg | ORAL_TABLET | Freq: Every day | ORAL | Status: DC
  Administered 2018-04-12 – 2018-04-13 (×2): 20 mg via ORAL
  Filled 2018-04-11 (×2): qty 1

## 2018-04-11 MED ORDER — AZITHROMYCIN 500 MG PO TABS
500.0000 mg | ORAL_TABLET | Freq: Every day | ORAL | Status: AC
  Administered 2018-04-11: 500 mg via ORAL
  Filled 2018-04-11: qty 1

## 2018-04-11 NOTE — Progress Notes (Signed)
PHARMACIST - PHYSICIAN COMMUNICATION  DR:   Leslye Peer  CONCERNING: IV to Oral Route Change Policy  RECOMMENDATION: This patient is receiving famotidine by the intravenous route.  Based on criteria approved by the Pharmacy and Therapeutics Committee, the intravenous medication(s) is/are being converted to the equivalent oral dose form(s).   DESCRIPTION: These criteria include:  The patient is eating (either orally or via tube) and/or has been taking other orally administered medications for a least 24 hours  The patient has no evidence of active gastrointestinal bleeding or impaired GI absorption (gastrectomy, short bowel, patient on TNA or NPO).  If you have questions about this conversion, please contact the Pharmacy Department  []   [phone] )  [facility] [x]   [phone] )  [facility] []   [phone] )  [facility] []   [phone] )  [facility] []   [phone] )  [place], [facility] 04/11/2018 8:16 AM

## 2018-04-11 NOTE — Progress Notes (Signed)
Patient ID: Jesse Dickson, male   DOB: 10/10/1940, 78 y.o.   MRN: [id number]  [facility] PROGRESS NOTE  Jesse Dickson FWY:[id number] DOB: [birth year] DOA: 04/07/2018 PCP: Cletis Athens, MD  HPI/Subjective: Patient still feeling very weak.  Not eating very much.  Some gas type pain in the epigastrium.  Pain in the testicles is gone.  Objective: Vitals:   04/11/18 1435 04/11/18 1500  BP: 126/69 134/69  Pulse: 85 82  Resp:    Temp:  98.3 F (36.8 C)  SpO2: 96% 95%    Filed Weights   04/09/18 0836 04/10/18 0617 04/11/18 0547  Weight: 83.4 kg 88.5 kg 87.8 kg    ROS: Review of Systems  Constitutional: Positive for malaise/fatigue. Negative for chills and fever.  Eyes: Negative for blurred vision.  Respiratory: Negative for cough and shortness of breath.   Cardiovascular: Negative for chest pain.  Gastrointestinal: Positive for abdominal pain. Negative for constipation, diarrhea, nausea and vomiting.  Genitourinary: Negative for dysuria.  Musculoskeletal: Negative for joint pain.  Neurological: Negative for dizziness and headaches.   Exam: Physical Exam  Constitutional: He is oriented to person, place, and time.  HENT:  Nose: No mucosal edema.  Mouth/Throat: No oropharyngeal exudate or posterior oropharyngeal edema.  Eyes: Pupils are equal, round, and reactive to light. Conjunctivae, EOM and lids are normal.  Neck: No JVD present. Carotid bruit is not present. No edema present. No thyroid mass and no thyromegaly present.  Cardiovascular: S1 normal and S2 normal. Exam reveals no gallop.  No murmur heard. Pulses:      Dorsalis pedis pulses are 2+ on the right side and 2+ on the left side.  Respiratory: No respiratory distress. He has decreased breath sounds in the right lower field and the left lower field. He has no wheezes. He has no rhonchi. He has rales in the right lower field and the left lower field.  GI: Soft. Bowel sounds are normal. There is no abdominal  tenderness.  Musculoskeletal:     Right ankle: He exhibits swelling.     Left ankle: He exhibits swelling.  Lymphadenopathy:    He has no cervical adenopathy.  Neurological: He is alert and oriented to person, place, and time. No cranial nerve deficit.  Skin: Skin is warm. No rash noted. Nails show no clubbing.  Psychiatric: He has a normal mood and affect.      Data Reviewed: Basic Metabolic Panel: Recent Labs  Lab 04/07/18 1441 04/08/18 0354 04/09/18 0357 04/10/18 0252 04/11/18 0354  NA 136 137 140 138 139  K 5.7* 4.8 4.5 4.6 4.8  CL 101 104 107 107 108  CO2 26 27 26 25 24   GLUCOSE 203* 153* 113* 113* 134*  BUN 35* 38* 40* 41* 41*  CREATININE 2.32* 2.67* 2.53* 2.44* 2.30*  CALCIUM 9.6 9.0 9.2 9.1 9.3   Liver Function Tests: Recent Labs  Lab 04/07/18 1441  AST 28  ALT 18  ALKPHOS 138*  BILITOT 1.6*  PROT 7.6  ALBUMIN 4.3   CBC: Recent Labs  Lab 04/07/18 1441 04/09/18 0357 04/10/18 0252  WBC 9.3 5.9  --   HGB 10.8* 8.6* 8.5*  HCT 34.6* 27.1*  --   MCV 94.3 93.8  --   PLT 215 197  --    BNP (last 3 results) Recent Labs    06/26/17 1621 06/28/17 0023 09/19/17 0612  BNP 332.0* 362.0* 313.0*      Studies: Dg Chest 2 View  Result Date: 04/11/2018 CLINICAL DATA:  Follow-up effusions EXAM: CHEST - 2 VIEW COMPARISON:  04/07/2018 FINDINGS: Cardiac shadow is enlarged. Bilateral pleural effusions are noted similar to that seen on prior CT examination. Underlying consolidation is again noted. Central vascular congestion is seen without interstitial edema. No acute bony abnormality is noted. IMPRESSION: Bibasilar consolidation with associated effusions similar to that seen on prior CT. Central vascular congestion is noted as well. Electronically Signed   By: Inez Catalina M.D.   On: 04/11/2018 09:47   US Venous Img Lower Unilateral Right  Result Date: 04/10/2018 CLINICAL DATA:  Right lower extremity pain and edema. History of prior DVT and prostate cancer.  Evaluate for acute or chronic DVT. EXAM: RIGHT LOWER EXTREMITY VENOUS DOPPLER ULTRASOUND TECHNIQUE: Gray-scale sonography with graded compression, as well as color Doppler and duplex ultrasound were performed to evaluate the lower extremity deep venous systems from the level of the common femoral vein and including the common femoral, femoral, profunda femoral, popliteal and calf veins including the posterior tibial, peroneal and gastrocnemius veins when visible. The superficial great saphenous vein was also interrogated. Spectral Doppler was utilized to evaluate flow at rest and with distal augmentation maneuvers in the common femoral, femoral and popliteal veins. COMPARISON:  None. FINDINGS: Contralateral Common Femoral Vein: Respiratory phasicity is normal and symmetric with the symptomatic side. No evidence of thrombus. Normal compressibility. Common Femoral Vein: No evidence of thrombus. Normal compressibility, respiratory phasicity and response to augmentation. Saphenofemoral Junction: No evidence of thrombus. Normal compressibility and flow on color Doppler imaging. Profunda Femoral Vein: No evidence of thrombus. Normal compressibility and flow on color Doppler imaging. Femoral Vein: Minimal echogenic wall thickening involving the proximal aspect of the right femoral vein (images 15 and 16). No evidence of thrombus. Normal compressibility, respiratory phasicity and response to augmentation. Popliteal Vein: No evidence of thrombus. Normal compressibility, respiratory phasicity and response to augmentation. Calf Veins: No evidence of thrombus. Normal compressibility and flow on color Doppler imaging. Superficial Great Saphenous Vein: No evidence of thrombus. Normal compressibility. Venous Reflux:  None. Other Findings:  None. IMPRESSION: 1. No evidence of acute DVT within the right lower extremity. 2. Suspected minimal amount of mural calcification involving the proximal aspect of the right femoral vein as  could be seen as a sequela prior DVT. Electronically Signed   By: Sandi Mariscal M.D.   On: 04/10/2018 11:35   Dg Chest Port 1 View  Result Date: 04/11/2018 CLINICAL DATA:  Post large volume right-sided thoracentesis. EXAM: PORTABLE CHEST 1 VIEW COMPARISON:  Chest radiograph-earlier same day; CT abdomen pelvis-04/07/2018 FINDINGS: Interval reduction/near resolution of persistent trace right-sided effusion post thoracentesis. No pneumothorax. Unchanged small to moderate-sized left-sided pleural effusion. Grossly unchanged enlarged cardiac silhouette and mediastinal contours. Improved aeration the right lung base with persistent bibasilar heterogeneous/consolidative opacities, left greater than right. No new focal airspace opacities. The pulmonary vasculature remains indistinct with cephalization of flow. No acute osseous abnormalities. Apparent loose bodies overlie the anterior inferior aspect of the right glenohumeral joint without definitive donor sites identified. IMPRESSION: 1. Interval reduction/near resolution of persistent trace right-sided pleural effusion post thoracentesis. No pneumothorax. 2. Similar findings of cardiomegaly, pulmonary edema and small to moderate-sized left-sided pleural effusion. 3. Improved aeration the right lung base with persistent bibasilar opacities, left greater than right, atelectasis versus infiltrate. Electronically Signed   By: Sandi Mariscal M.D.   On: 04/11/2018 14:50   US Thoracentesis Asp Pleural Space W/img Guide  Result Date: 04/11/2018 INDICATION: Symptomatic right sided pleural effusion EXAM: US THORACENTESIS ASP  PLEURAL SPACE W/IMG GUIDE COMPARISON:  Chest radiograph - earlier same day; CT abdomen pelvis - 04/07/2018 MEDICATIONS: None. COMPLICATIONS: None immediate. TECHNIQUE: Informed written consent was obtained from the patient after a discussion of the risks, benefits and alternatives to treatment. A timeout was performed prior to the initiation of the procedure.  Initial ultrasound scanning demonstrates a moderate to large sized anechoic right-sided pleural effusion. The lower chest was prepped and draped in the usual sterile fashion. 1% lidocaine was used for local anesthesia. An ultrasound image was saved for documentation purposes. An 8 Fr Safe-T-Centesis catheter was introduced. The thoracentesis was performed. The catheter was removed and a dressing was applied. The patient tolerated the procedure well without immediate post procedural complication. The patient was escorted to have an upright chest radiograph. FINDINGS: A total of approximately 1.2 liters of serous fluid was removed. Requested samples were sent to the laboratory. IMPRESSION: Successful ultrasound-guided right sided thoracentesis yielding 1.2 liters of pleural fluid. Electronically Signed   By: Sandi Mariscal M.D.   On: 04/11/2018 15:20    Scheduled Meds: . aspirin EC  81 mg Oral Daily  . atorvastatin  20 mg Oral QHS  . [START ON 04/12/2018] azithromycin  250 mg Oral Daily  . [START ON 04/12/2018] citalopram  20 mg Oral Daily  . famotidine  20 mg Oral Daily  . feeding supplement (GLUCERNA SHAKE)  237 mL Oral TID BM  . gabapentin  100 mg Oral Daily  . heparin  5,000 Units Subcutaneous Q8H  . insulin aspart  0-5 Units Subcutaneous QHS  . insulin aspart  0-9 Units Subcutaneous TID WC  . pantoprazole  40 mg Oral Daily   Continuous Infusions: . cefTRIAXone (ROCEPHIN)  IV 1 g (04/10/18 2132)    Assessment/Plan:   1. Acute kidney injury on chronic kidney disease stage III.  Case discussed with Dr. Holley Raring nephrology.  Continue to try to let the body equilibrate by holding fluids and Lasix.  Check BMP daily.  Creatinine down to 2.3 today. 2. Bilateral pleural effusions.  Thoracentesis today of 1.2 L on the right lung.  Follow-up results of testing. 3. Possibility of pneumonia on chest x-ray, epididymitis on Rocephin.  Added Zithromax so far urine culture negative. 4. Chronic diastolic  congestive heart failure.  Need to watch closely at this point.  The patient does have lower extremity edema and bilateral pleural effusions.  Thoracentesis of 1.2 L today from the right lung.  TED hose ordered. 5. Hyperkalemia.  Given Veltassa.  Potassium has improved. 6. Type 2 diabetes mellitus.  Put on sliding scale 7. Essential hypertension.  Holding meds at this point.  Blood pressure was a little low on presentation. 8. Hyperlipidemia unspecified on atorvastatin 9. GERD on Pepcid and Protonix 10. Anemia.  Continue to watch hemoglobin.  Hemoglobin 8.6 today after hydration yesterday.  Nephrology to consider Epogen. 11. Right leg pain.  Likely sciatica.  Ultrasound negative for DVT.  Nurse able to Doppler pulses. 12. Poor appetite.  Patient drinking some Glucerna's. 13. Depression.  Increase Celexa to 20 mg daily with his flat affect.  Code Status:     Code Status Orders  (From admission, onward)         Start     Ordered   04/07/18 2104  Full code  Continuous     04/07/18 2103        Code Status History    Date Active Date Inactive Code Status Order ID Comments User Context   09/19/2017  1054 09/22/2017 1638 Full Code [id number]  Saundra Shelling, MD Inpatient   06/28/2017 0416 06/30/2017 1830 Full Code [id number]  Harrie Foreman, MD Inpatient     Family Communication: Spoke with wife at bedside.  Disposition Plan: To be determined  Antibiotics:  Rocephin  Zithromax  Time spent: 27 minutes  [place]

## 2018-04-11 NOTE — Progress Notes (Signed)
Physical Therapy Treatment Patient Details Name: Jesse Dickson MRN: [id number] DOB: February 13, 1941 Today's Date: 04/11/2018    History of Present Illness pt is a 78 y.o M admitted on 04/07/2018 with dx of acute on chronic renal failure. known history of hypertension, chronic diastolic congestive heart failure, hypercholesterolemia, type II diabetes, CKD stage III. pt went to ED reporting R flank pain and was dx with a UTI and dehydration.     PT Comments    Pt agreeable to PT; complains of some upper stomach/diaphragmatic pain. Pain in R LB/posterior hip 7/10 with ambulation only. Pt does demonstrate ability to ambulate further today; however, does experience limiting pain at approximately 105 ft into walk. Pt stops several times, requiring seated rest third time. Brief rest wheeling pt to room; pt then able to demonstrate sit to stand and 20 ft walk into room without AD and unassisted. Per spouse, pt only ambulates short household distances (room to room) and community distances (ie entrance of Dr. office to chair in waiting room). Discussed using a rolling walker with pt/spouse. Spouse notes pt does have a rollator at home and notes she will have it brought to the hospital today. Re educated and encouraged to perform LE exercises while up in chair to help manage edema in BLEs. Continue PT to progress endurance and strength and allow for an optimal, safe return home.   Follow Up Recommendations  Home health PT     Equipment Recommendations  None recommended by PT    Recommendations for Other Services       Precautions / Restrictions Precautions Precautions: None Restrictions Weight Bearing Restrictions: No    Mobility  Bed Mobility Overal bed mobility: Modified Independent             General bed mobility comments: Increased time  Transfers Overall transfer level: Modified independent Equipment used: None;Straight cane             General transfer comment: Demonstrates  improved STS both with and without AD  Ambulation/Gait Ambulation/Gait assistance: Min guard Gait Distance (Feet): 150 Feet Assistive device: Straight cane Gait Pattern/deviations: Step-through pattern     General Gait Details: Initially ambulating well; pt takes first stand rest due to pain with grasping R posterior hip and fwd flexion at 105 feet. Self initiates ambulation again with pause in same manner at 120 ft. Pt uses rail and cane for an additional 30 ft before stopping and requesting to sit down. Additional staff brought chair; pt wheeled 30 ft to room and was then able to stand and ambulate without AD into room. Pt received up in chair.    Stairs             Wheelchair Mobility    Modified Rankin (Stroke Patients Only)       Balance                                            Cognition Arousal/Alertness: Awake/alert Behavior During Therapy: WFL for tasks assessed/performed;Flat affect(inital flat affect; see general comments) Overall Cognitive Status: Within Functional Limits for tasks assessed                                 General Comments: Converses little in room in presence of spouse, upon walking out of room pt much more vocal  expressing frustration with spouse not understanding pain and "pushing" pt.       Exercises General Exercises - Lower Extremity Ankle Circles/Pumps: AROM;Both;20 reps Quad Sets: Strengthening;Both;20 reps Gluteal Sets: Strengthening;Both;20 reps Long Arc Quad: AROM;Both;10 reps;Seated(2 sets) Heel Slides: AROM;Both;10 reps(limits range)    General Comments        Pertinent Vitals/Pain Pain Assessment: 0-10 Pain Score: 7  Pain Location: R LB/hip with ambulation; also c/o upper stomach/diaphragm pain Pain Descriptors / Indicators: Grimacing Pain Intervention(s): Monitored during session;Repositioned    Home Living                      Prior Function            PT Goals  (current goals can now be found in the care plan section) Progress towards PT goals: Progressing toward goals    Frequency    Min 2X/week      PT Plan Current plan remains appropriate    Co-evaluation              AM-PAC PT "6 Clicks" Mobility   Outcome Measure  Help needed turning from your back to your side while in a flat bed without using bedrails?: None Help needed moving from lying on your back to sitting on the side of a flat bed without using bedrails?: None Help needed moving to and from a bed to a chair (including a wheelchair)?: None Help needed standing up from a chair using your arms (e.g., wheelchair or bedside chair)?: None Help needed to walk in hospital room?: A Little Help needed climbing 3-5 steps with a railing? : A Little 6 Click Score: 22    End of Session Equipment Utilized During Treatment: Gait belt Activity Tolerance: Patient limited by pain Patient left: in chair;with call bell/phone within reach;with family/visitor present;Other (comment)(spouse refuses alarm while she is in the room)   PT Visit Diagnosis: Unsteadiness on feet (R26.81);Pain;Muscle weakness (generalized) (M62.81);Difficulty in walking, not elsewhere classified (R26.2) Pain - Right/Left: Right     Time: 5732-2025 PT Time Calculation (min) (ACUTE ONLY): 25 min  Charges:  $Gait Training: 8-22 mins $Therapeutic Exercise: 8-22 mins                      Larae Grooms, PTA 04/11/2018, 12:18 PM

## 2018-04-11 NOTE — Consult Note (Signed)
[facility], [place] 04/11/18  Subjective:  Renal function is slightly improved. Creatinine down to 2.3. EGFR 31. Patient did work with physical therapy today.   Objective:  Vital signs in last 24 hours:  Temp:  [98 F (36.7 C)] 98 F (36.7 C) (02/05 0800) Pulse Rate:  [70-84] 83 (02/05 1130) Resp:  [18] 18 (02/05 0800) BP: (114-134)/(69-74) 134/69 (02/05 0800) SpO2:  [96 %] 96 % (02/05 1130) Weight:  [87.8 kg] 87.8 kg (02/05 0547)  Weight change: 4.4 kg Filed Weights   04/09/18 0836 04/10/18 0617 04/11/18 0547  Weight: 83.4 kg 88.5 kg 87.8 kg    Intake/Output:    Intake/Output Summary (Last 24 hours) at 04/11/2018 1328 Last data filed at 04/10/2018 1900 Gross per 24 hour  Intake 240 ml  Output -  Net 240 ml     Physical Exam: General:  No acute distress, sitting up in bed  HEENT  moist oral mucous membranes, anicteric  Neck  supple  Pulm/lungs  decreased breath sounds at bases, no crackles  CVS/Heart  regular, soft systolic murmur  Abdomen:   Soft, nontender  Extremities:  1+ pitting edema bilaterally  Neurologic:  Alert, oriented.  Follows commands  Skin:  No acute rashes    Basic Metabolic Panel:  Recent Labs  Lab 04/07/18 1441 04/08/18 0354 04/09/18 0357 04/10/18 0252 04/11/18 0354  NA 136 137 140 138 139  K 5.7* 4.8 4.5 4.6 4.8  CL 101 104 107 107 108  CO2 _0 GLUCOSE 203* 153* 113* 113* 134*  BUN 35* 38* 40* 41* 41*  CREATININE 2.32* 2.67* 2.53* 2.44* 2.30*  CALCIUM 9.6 9.0 9.2 9.1 9.3     CBC: Recent Labs  Lab 04/07/18 1441 04/09/18 0357 04/10/18 0252  WBC 9.3 5.9  --   HGB 10.8* 8.6* 8.5*  HCT 34.6* 27.1*  --   MCV 94.3 93.8  --   PLT 215 197  --      No results found for: HEPBSAG, HEPBSAB, HEPBIGM    Microbiology:  Recent Results (from the past 240 hour(s))  Urine Culture     Status: None   Collection Time: 04/07/18  5:59 PM  Result Value Ref Range Status   Specimen Description  URINE, RANDOM  Final   Special Requests   Final    Normal Performed at [facility], [address] [place], [place] [address]    Culture NO GROWTH  Final   Report Status 04/09/2018 FINAL  Final    Coagulation Studies: No results for input(s): LABPROT, INR in the last 72 hours.  Urinalysis: No results for input(s): COLORURINE, LABSPEC, PHURINE, GLUCOSEU, HGBUR, BILIRUBINUR, KETONESUR, PROTEINUR, UROBILINOGEN, NITRITE, LEUKOCYTESUR in the last 72 hours.  Invalid input(s): APPERANCEUR    Imaging: Dg Chest 2 View  Result Date: 04/11/2018 CLINICAL DATA:  Follow-up effusions EXAM: CHEST - 2 VIEW COMPARISON:  04/07/2018 FINDINGS: Cardiac shadow is enlarged. Bilateral pleural effusions are noted similar to that seen on prior CT examination. Underlying consolidation is again noted. Central vascular congestion is seen without interstitial edema. No acute bony abnormality is noted. IMPRESSION: Bibasilar consolidation with associated effusions similar to that seen on prior CT. Central vascular congestion is noted as well. Electronically Signed   By: Inez Catalina M.D.   On: 04/11/2018 09:47   US Venous Img Lower Unilateral Right  Result Date: 04/10/2018 CLINICAL DATA:  Right lower extremity pain and edema. History of prior DVT and prostate cancer.  Evaluate for acute or chronic DVT. EXAM: RIGHT LOWER EXTREMITY VENOUS DOPPLER ULTRASOUND TECHNIQUE: Gray-scale sonography with graded compression, as well as color Doppler and duplex ultrasound were performed to evaluate the lower extremity deep venous systems from the level of the common femoral vein and including the common femoral, femoral, profunda femoral, popliteal and calf veins including the posterior tibial, peroneal and gastrocnemius veins when visible. The superficial great saphenous vein was also interrogated. Spectral Doppler was utilized to evaluate flow at rest and with distal augmentation maneuvers in the common femoral, femoral and  popliteal veins. COMPARISON:  None. FINDINGS: Contralateral Common Femoral Vein: Respiratory phasicity is normal and symmetric with the symptomatic side. No evidence of thrombus. Normal compressibility. Common Femoral Vein: No evidence of thrombus. Normal compressibility, respiratory phasicity and response to augmentation. Saphenofemoral Junction: No evidence of thrombus. Normal compressibility and flow on color Doppler imaging. Profunda Femoral Vein: No evidence of thrombus. Normal compressibility and flow on color Doppler imaging. Femoral Vein: Minimal echogenic wall thickening involving the proximal aspect of the right femoral vein (images 15 and 16). No evidence of thrombus. Normal compressibility, respiratory phasicity and response to augmentation. Popliteal Vein: No evidence of thrombus. Normal compressibility, respiratory phasicity and response to augmentation. Calf Veins: No evidence of thrombus. Normal compressibility and flow on color Doppler imaging. Superficial Great Saphenous Vein: No evidence of thrombus. Normal compressibility. Venous Reflux:  None. Other Findings:  None. IMPRESSION: 1. No evidence of acute DVT within the right lower extremity. 2. Suspected minimal amount of mural calcification involving the proximal aspect of the right femoral vein as could be seen as a sequela prior DVT. Electronically Signed   By: Sandi Mariscal M.D.   On: 04/10/2018 11:35     Medications:   . cefTRIAXone (ROCEPHIN)  IV 1 g (04/10/18 2132)   . aspirin EC  81 mg Oral Daily  . atorvastatin  20 mg Oral QHS  . [START ON 04/12/2018] azithromycin  250 mg Oral Daily  . [START ON 04/12/2018] citalopram  20 mg Oral Daily  . famotidine  20 mg Oral Daily  . feeding supplement (GLUCERNA SHAKE)  237 mL Oral TID BM  . gabapentin  100 mg Oral Daily  . heparin  5,000 Units Subcutaneous Q8H  . insulin aspart  0-5 Units Subcutaneous QHS  . insulin aspart  0-9 Units Subcutaneous TID WC  . pantoprazole  40 mg Oral Daily    acetaminophen **OR** acetaminophen, albuterol, guaiFENesin-dextromethorphan, ondansetron **OR** ondansetron (ZOFRAN) IV  Assessment/ Plan:  78 y.o. male  hyperlipidemia, diabetes mellitus type 2 greater than 20 years, chronic diastolic heart failure, GERD, anemia chronic kidney disease, prostate cancer, chronic low back pain, history of left lower extremity DVT   Acute kidney injury.  Cause is unclear. -Possibly related to hemodynamic changes versus urinary retention Renal ultrasound is negative for hydronephrosis, bladder wall thickening noted. -renal function continues to improve very slowly with a creatinine of 2.3 while the patient is off of diuretics as well as IV fluid hydration.  Continue to monitor renal parameters.  Chronic kidney disease stage III.  Baseline creatinine 1.67/GFR 45 from October 12, 2017 -Likely secondary to atherosclerosis, hypertension and diabetes  Proteinuria Most recent urine protein to creatinine ratio was 1.2.  We will consider ARB as an outpatient.  Hyperkalemia -potassium under good control at 4.8.  Patient treated with patiromer.  DM-2 with CKD Hemoglobin A1c acceptable at 7.0.   LOS: 4 Joyous Gleghorn 2/5/20201:28 PM  [place], [place]  [phone]  Note: This note was prepared with Dragon dictation. Any transcription errors are unintentional

## 2018-04-11 NOTE — Procedures (Signed)
Pre procedural Dx: Symptomatic Pleural effusion Post procedural Dx: Same  Successful US guided right sided thoracentesis yielding 1.2 L of serous pleural fluid.   Samples sent to lab for analysis.  EBL: None  Complications: None immediate.  Jay Kailin Principato, MD Pager #: [phone]   

## 2018-04-12 LAB — CBC
HCT: 28.9 % — ABNORMAL LOW (ref 39.0–52.0)
Hemoglobin: 9 g/dL — ABNORMAL LOW (ref 13.0–17.0)
MCH: 29.3 pg (ref 26.0–34.0)
MCHC: 31.1 g/dL (ref 30.0–36.0)
MCV: 94.1 fL (ref 80.0–100.0)
Platelets: 218 10*3/uL (ref 150–400)
RBC: 3.07 MIL/uL — ABNORMAL LOW (ref 4.22–5.81)
RDW: 13.2 % (ref 11.5–15.5)
WBC: 6.4 10*3/uL (ref 4.0–10.5)
nRBC: 0 % (ref 0.0–0.2)

## 2018-04-12 LAB — BASIC METABOLIC PANEL
Anion gap: 7 (ref 5–15)
BUN: 44 mg/dL — ABNORMAL HIGH (ref 8–23)
CHLORIDE: 107 mmol/L (ref 98–111)
CO2: 25 mmol/L (ref 22–32)
Calcium: 9.2 mg/dL (ref 8.9–10.3)
Creatinine, Ser: 2.21 mg/dL — ABNORMAL HIGH (ref 0.61–1.24)
GFR calc Af Amer: 33 mL/min — ABNORMAL LOW (ref >60)
GFR calc non Af Amer: 28 mL/min — ABNORMAL LOW (ref >60)
Glucose, Bld: 160 mg/dL — ABNORMAL HIGH (ref 70–99)
Potassium: 4.6 mmol/L (ref 3.5–5.1)
SODIUM: 139 mmol/L (ref 135–145)

## 2018-04-12 LAB — PROTEIN, BODY FLUID (OTHER): Total Protein, Body Fluid Other: 2.7 g/dL

## 2018-04-12 LAB — GLUCOSE, CAPILLARY
Glucose-Capillary: 126 mg/dL — ABNORMAL HIGH (ref 70–99)
Glucose-Capillary: 146 mg/dL — ABNORMAL HIGH (ref 70–99)
Glucose-Capillary: 216 mg/dL — ABNORMAL HIGH (ref 70–99)
Glucose-Capillary: 206 mg/dL — ABNORMAL HIGH (ref 70–99)

## 2018-04-12 LAB — MAGNESIUM: Magnesium: 2.2 mg/dL (ref 1.7–2.4)

## 2018-04-12 LAB — PH, BODY FLUID: pH, Body Fluid: 7.6

## 2018-04-12 LAB — CYTOLOGY - NON PAP

## 2018-04-12 MED ORDER — IPRATROPIUM-ALBUTEROL 0.5-2.5 (3) MG/3ML IN SOLN
3.0000 mL | Freq: Once | RESPIRATORY_TRACT | Status: AC
  Administered 2018-04-12: 3 mL via RESPIRATORY_TRACT
  Filled 2018-04-12: qty 3

## 2018-04-12 MED ORDER — METHYLPREDNISOLONE SODIUM SUCC 125 MG IJ SOLR
60.0000 mg | INTRAMUSCULAR | Status: DC
  Administered 2018-04-12 – 2018-04-13 (×2): 60 mg via INTRAVENOUS
  Filled 2018-04-12 (×2): qty 2

## 2018-04-12 NOTE — Consult Note (Signed)
[facility], [place] 04/12/18  Subjective:  Renal function slowly improving. BUN currently 44 with a creatinine of 2.2. Urine output 1.2 L over the preceding 24 hours.   Objective:  Vital signs in last 24 hours:  Temp:  [98.2 F (36.8 C)-98.8 F (37.1 C)] 98.8 F (37.1 C) (02/06 0731) Pulse Rate:  [82-89] 89 (02/06 0731) Resp:  [17] 17 (02/05 2303) BP: (126-134)/(63-73) 128/73 (02/06 0731) SpO2:  [93 %-96 %] 93 % (02/06 0731) Weight:  [86.2 kg] 86.2 kg (02/06 0633)  Weight change: -1.617 kg Filed Weights   04/10/18 0617 04/11/18 0547 04/12/18 7510  Weight: 88.5 kg 87.8 kg 86.2 kg    Intake/Output:    Intake/Output Summary (Last 24 hours) at 04/12/2018 1203 Last data filed at 04/12/2018 0900 Gross per 24 hour  Intake 8546.61 ml  Output 925 ml  Net 7621.61 ml     Physical Exam: General:  No acute distress, sitting up in bed  HEENT  moist oral mucous membranes, anicteric  Neck  supple  Pulm/lungs  decreased breath sounds at bases, no crackles  CVS/Heart  regular, soft systolic murmur  Abdomen:   Soft, nontender  Extremities:  1+ pitting edema bilaterally  Neurologic:  Alert, oriented.  Follows commands  Skin:  No acute rashes    Basic Metabolic Panel:  Recent Labs  Lab 04/08/18 0354 04/09/18 0357 04/10/18 0252 04/11/18 0354 04/12/18 0328  NA 137 140 138 139 139  K 4.8 4.5 4.6 4.8 4.6  CL 104 107 107 108 107  CO2 27 26 25 24 25   GLUCOSE 153* 113* 113* 134* 160*  BUN 38* 40* 41* 41* 44*  CREATININE 2.67* 2.53* 2.44* 2.30* 2.21*  CALCIUM 9.0 9.2 9.1 9.3 9.2  MG  --   --   --   --  2.2     CBC: Recent Labs  Lab 04/07/18 1441 04/09/18 0357 04/10/18 0252 04/12/18 0328  WBC 9.3 5.9  --  6.4  HGB 10.8* 8.6* 8.5* 9.0*  HCT 34.6* 27.1*  --  28.9*  MCV 94.3 93.8  --  94.1  PLT 215 197  --  218     No results found for: HEPBSAG, HEPBSAB, HEPBIGM    Microbiology:  Recent Results (from the past 240 hour(s))  Urine  Culture     Status: None   Collection Time: 04/07/18  5:59 PM  Result Value Ref Range Status   Specimen Description URINE, RANDOM  Final   Special Requests   Final    Normal Performed at [facility], [address] [place], [place] [address]    Culture NO GROWTH  Final   Report Status 04/09/2018 FINAL  Final  Body fluid culture     Status: None (Preliminary result)   Collection Time: 04/11/18  2:51 PM  Result Value Ref Range Status   Specimen Description   Final    PLEURAL Performed at [facility], [address], [place], [place] [address]    Special Requests   Final    NONE Performed at [facility], [address] [place], [place] [address]    Gram Stain   Final    WBC PRESENT,BOTH PMN AND MONONUCLEAR NO ORGANISMS SEEN CYTOSPIN SMEAR    Culture   Final    NO GROWTH < 24 HOURS Performed at [facility], [place] [address] [place],  [address]    Report Status PENDING  Incomplete    Coagulation Studies: No  results for input(s): LABPROT, INR in the last 72 hours.  Urinalysis: No results for input(s): COLORURINE, LABSPEC, PHURINE, GLUCOSEU, HGBUR, BILIRUBINUR, KETONESUR, PROTEINUR, UROBILINOGEN, NITRITE, LEUKOCYTESUR in the last 72 hours.  Invalid input(s): APPERANCEUR    Imaging: Dg Chest 2 View  Result Date: 04/11/2018 CLINICAL DATA:  Follow-up effusions EXAM: CHEST - 2 VIEW COMPARISON:  04/07/2018 FINDINGS: Cardiac shadow is enlarged. Bilateral pleural effusions are noted similar to that seen on prior CT examination. Underlying consolidation is again noted. Central vascular congestion is seen without interstitial edema. No acute bony abnormality is noted. IMPRESSION: Bibasilar consolidation with associated effusions similar to that seen on prior CT. Central vascular congestion is noted as well. Electronically Signed   By: Inez Catalina M.D.   On: 04/11/2018 09:47   Dg Chest Port 1 View  Result Date: 04/11/2018 CLINICAL DATA:   Post large volume right-sided thoracentesis. EXAM: PORTABLE CHEST 1 VIEW COMPARISON:  Chest radiograph-earlier same day; CT abdomen pelvis-04/07/2018 FINDINGS: Interval reduction/near resolution of persistent trace right-sided effusion post thoracentesis. No pneumothorax. Unchanged small to moderate-sized left-sided pleural effusion. Grossly unchanged enlarged cardiac silhouette and mediastinal contours. Improved aeration the right lung base with persistent bibasilar heterogeneous/consolidative opacities, left greater than right. No new focal airspace opacities. The pulmonary vasculature remains indistinct with cephalization of flow. No acute osseous abnormalities. Apparent loose bodies overlie the anterior inferior aspect of the right glenohumeral joint without definitive donor sites identified. IMPRESSION: 1. Interval reduction/near resolution of persistent trace right-sided pleural effusion post thoracentesis. No pneumothorax. 2. Similar findings of cardiomegaly, pulmonary edema and small to moderate-sized left-sided pleural effusion. 3. Improved aeration the right lung base with persistent bibasilar opacities, left greater than right, atelectasis versus infiltrate. Electronically Signed   By: Sandi Mariscal M.D.   On: 04/11/2018 14:50   US Thoracentesis Asp Pleural Space W/img Guide  Result Date: 04/11/2018 INDICATION: Symptomatic right sided pleural effusion EXAM: US THORACENTESIS ASP PLEURAL SPACE W/IMG GUIDE COMPARISON:  Chest radiograph - earlier same day; CT abdomen pelvis - 04/07/2018 MEDICATIONS: None. COMPLICATIONS: None immediate. TECHNIQUE: Informed written consent was obtained from the patient after a discussion of the risks, benefits and alternatives to treatment. A timeout was performed prior to the initiation of the procedure. Initial ultrasound scanning demonstrates a moderate to large sized anechoic right-sided pleural effusion. The lower chest was prepped and draped in the usual sterile fashion.  1% lidocaine was used for local anesthesia. An ultrasound image was saved for documentation purposes. An 8 Fr Safe-T-Centesis catheter was introduced. The thoracentesis was performed. The catheter was removed and a dressing was applied. The patient tolerated the procedure well without immediate post procedural complication. The patient was escorted to have an upright chest radiograph. FINDINGS: A total of approximately 1.2 liters of serous fluid was removed. Requested samples were sent to the laboratory. IMPRESSION: Successful ultrasound-guided right sided thoracentesis yielding 1.2 liters of pleural fluid. Electronically Signed   By: Sandi Mariscal M.D.   On: 04/11/2018 15:20     Medications:   . cefTRIAXone (ROCEPHIN)  IV 1 g (04/11/18 2132)   . aspirin EC  81 mg Oral Daily  . atorvastatin  20 mg Oral QHS  . azithromycin  250 mg Oral Daily  . citalopram  20 mg Oral Daily  . famotidine  20 mg Oral Daily  . feeding supplement (GLUCERNA SHAKE)  237 mL Oral TID BM  . gabapentin  100 mg Oral Daily  . heparin  5,000 Units Subcutaneous Q8H  . insulin aspart  0-5 Units Subcutaneous QHS  . insulin aspart  0-9 Units Subcutaneous TID WC  . ipratropium-albuterol  3 mL Nebulization Once  . methylPREDNISolone (SOLU-MEDROL) injection  60 mg Intravenous Q24H  . pantoprazole  40 mg Oral Daily   acetaminophen **OR** acetaminophen, albuterol, guaiFENesin-dextromethorphan, ondansetron **OR** ondansetron (ZOFRAN) IV  Assessment/ Plan:  79 y.o. male  hyperlipidemia, diabetes mellitus type 2 greater than 20 years, chronic diastolic heart failure, GERD, anemia chronic kidney disease, prostate cancer, chronic low back pain, history of left lower extremity DVT   Acute kidney injury.  Cause is unclear. -Possibly related to hemodynamic changes versus urinary retention Renal ultrasound is negative for hydronephrosis, bladder wall thickening noted. -Kidney function continues to improve slowly with a creatinine of 2.2  and good urine output of 1.2 L.  Continue to hold diuretics as well as IV fluids.  Chronic kidney disease stage III.  Baseline creatinine 1.67/GFR 45 from October 12, 2017 -Likely secondary to atherosclerosis, hypertension and diabetes  Proteinuria As renal function continues to improve we will hopefully be able to add back an ARB as an outpatient.  Hyperkalemia Potassium currently 4.6 and at target.  Maintain the patient on preterm her for now.  DM-2 with CKD Hemoglobin A1c acceptable at 7.0.   LOS: 5 Jesse Dickson 2/6/202012:03 PM  [place], [place]  Note: This note was prepared with Dragon dictation. Any transcription errors are unintentional

## 2018-04-12 NOTE — Progress Notes (Signed)
Patient ID: Jesse Dickson, male   DOB: [birth year], 78 y.o.   MRN: [id number]  [employer] PROGRESS NOTE  Jesse Dickson KGM:[id number] DOB: 1941/02/27 DOA: 04/07/2018 PCP: Cletis Athens, MD  HPI/Subjective:  Poor appetite.  Complains of choking on food. wife at bedside.  Afebrile. Not on oxygen  Objective: Vitals:   04/11/18 2303 04/12/18 0731  BP: 128/71 128/73  Pulse: 86 89  Resp: 17   Temp: 98.2 F (36.8 C) 98.8 F (37.1 C)  SpO2: 93% 93%    Filed Weights   04/10/18 0617 04/11/18 0547 04/12/18 0633  Weight: 88.5 kg 87.8 kg 86.2 kg    ROS: Review of Systems  Constitutional: Positive for malaise/fatigue. Negative for chills and fever.  Eyes: Negative for blurred vision.  Respiratory: Negative for cough and shortness of breath.   Cardiovascular: Negative for chest pain.  Gastrointestinal: Positive for abdominal pain. Negative for constipation, diarrhea, nausea and vomiting.  Genitourinary: Negative for dysuria.  Musculoskeletal: Negative for joint pain.  Neurological: Negative for dizziness and headaches.   Exam: Physical Exam  Constitutional: He is oriented to person, place, and time.  HENT:  Nose: No mucosal edema.  Mouth/Throat: No oropharyngeal exudate or posterior oropharyngeal edema.  Eyes: Pupils are equal, round, and reactive to light. Conjunctivae, EOM and lids are normal.  Neck: No JVD present. Carotid bruit is not present. No edema present. No thyroid mass and no thyromegaly present.  Cardiovascular: S1 normal and S2 normal. Exam reveals no gallop.  No murmur heard. Pulses:      Dorsalis pedis pulses are 2+ on the right side and 2+ on the left side.  Respiratory: No respiratory distress. He has decreased breath sounds in the right lower field and the left lower field. He has no wheezes. He has no rhonchi. He has rales in the right lower field and the left lower field.  GI: Soft. Bowel sounds are normal. There is no abdominal tenderness.   Musculoskeletal:     Right ankle: He exhibits swelling.     Left ankle: He exhibits swelling.  Lymphadenopathy:    He has no cervical adenopathy.  Neurological: He is alert and oriented to person, place, and time. No cranial nerve deficit.  Skin: Skin is warm. No rash noted. Nails show no clubbing.  Psychiatric: He has a normal mood and affect.      Data Reviewed: Basic Metabolic Panel: Recent Labs  Lab 04/08/18 0354 04/09/18 0357 04/10/18 0252 04/11/18 0354 04/12/18 0328  NA 137 140 138 139 139  K 4.8 4.5 4.6 4.8 4.6  CL 104 107 107 108 107  CO2 27 26 25 24 25   GLUCOSE 153* 113* 113* 134* 160*  BUN 38* 40* 41* 41* 44*  CREATININE 2.67* 2.53* 2.44* 2.30* 2.21*  CALCIUM 9.0 9.2 9.1 9.3 9.2  MG  --   --   --   --  2.2   Liver Function Tests: Recent Labs  Lab 04/07/18 1441  AST 28  ALT 18  ALKPHOS 138*  BILITOT 1.6*  PROT 7.6  ALBUMIN 4.3   CBC: Recent Labs  Lab 04/07/18 1441 04/09/18 0357 04/10/18 0252 04/12/18 0328  WBC 9.3 5.9  --  6.4  HGB 10.8* 8.6* 8.5* 9.0*  HCT 34.6* 27.1*  --  28.9*  MCV 94.3 93.8  --  94.1  PLT 215 197  --  218   BNP (last 3 results) Recent Labs    06/26/17 1621 06/28/17 0023 09/19/17 0612  BNP 332.0* 362.0* 313.0*  Studies: Dg Chest 2 View  Result Date: 04/11/2018 CLINICAL DATA:  Follow-up effusions EXAM: CHEST - 2 VIEW COMPARISON:  04/07/2018 FINDINGS: Cardiac shadow is enlarged. Bilateral pleural effusions are noted similar to that seen on prior CT examination. Underlying consolidation is again noted. Central vascular congestion is seen without interstitial edema. No acute bony abnormality is noted. IMPRESSION: Bibasilar consolidation with associated effusions similar to that seen on prior CT. Central vascular congestion is noted as well. Electronically Signed   By: Inez Catalina M.D.   On: 04/11/2018 09:47   Dg Chest Port 1 View  Result Date: 04/11/2018 CLINICAL DATA:  Post large volume right-sided thoracentesis.  EXAM: PORTABLE CHEST 1 VIEW COMPARISON:  Chest radiograph-earlier same day; CT abdomen pelvis-04/07/2018 FINDINGS: Interval reduction/near resolution of persistent trace right-sided effusion post thoracentesis. No pneumothorax. Unchanged small to moderate-sized left-sided pleural effusion. Grossly unchanged enlarged cardiac silhouette and mediastinal contours. Improved aeration the right lung base with persistent bibasilar heterogeneous/consolidative opacities, left greater than right. No new focal airspace opacities. The pulmonary vasculature remains indistinct with cephalization of flow. No acute osseous abnormalities. Apparent loose bodies overlie the anterior inferior aspect of the right glenohumeral joint without definitive donor sites identified. IMPRESSION: 1. Interval reduction/near resolution of persistent trace right-sided pleural effusion post thoracentesis. No pneumothorax. 2. Similar findings of cardiomegaly, pulmonary edema and small to moderate-sized left-sided pleural effusion. 3. Improved aeration the right lung base with persistent bibasilar opacities, left greater than right, atelectasis versus infiltrate. Electronically Signed   By: Sandi Mariscal M.D.   On: 04/11/2018 14:50   US Thoracentesis Asp Pleural Space W/img Guide  Result Date: 04/11/2018 INDICATION: Symptomatic right sided pleural effusion EXAM: US THORACENTESIS ASP PLEURAL SPACE W/IMG GUIDE COMPARISON:  Chest radiograph - earlier same day; CT abdomen pelvis - 04/07/2018 MEDICATIONS: None. COMPLICATIONS: None immediate. TECHNIQUE: Informed written consent was obtained from the patient after a discussion of the risks, benefits and alternatives to treatment. A timeout was performed prior to the initiation of the procedure. Initial ultrasound scanning demonstrates a moderate to large sized anechoic right-sided pleural effusion. The lower chest was prepped and draped in the usual sterile fashion. 1% lidocaine was used for local anesthesia.  An ultrasound image was saved for documentation purposes. An 8 Fr Safe-T-Centesis catheter was introduced. The thoracentesis was performed. The catheter was removed and a dressing was applied. The patient tolerated the procedure well without immediate post procedural complication. The patient was escorted to have an upright chest radiograph. FINDINGS: A total of approximately 1.2 liters of serous fluid was removed. Requested samples were sent to the laboratory. IMPRESSION: Successful ultrasound-guided right sided thoracentesis yielding 1.2 liters of pleural fluid. Electronically Signed   By: Sandi Mariscal M.D.   On: 04/11/2018 15:20    Scheduled Meds: . aspirin EC  81 mg Oral Daily  . atorvastatin  20 mg Oral QHS  . citalopram  20 mg Oral Daily  . famotidine  20 mg Oral Daily  . feeding supplement (GLUCERNA SHAKE)  237 mL Oral TID BM  . gabapentin  100 mg Oral Daily  . heparin  5,000 Units Subcutaneous Q8H  . insulin aspart  0-5 Units Subcutaneous QHS  . insulin aspart  0-9 Units Subcutaneous TID WC  . ipratropium-albuterol  3 mL Nebulization Once  . methylPREDNISolone (SOLU-MEDROL) injection  60 mg Intravenous Q24H  . pantoprazole  40 mg Oral Daily   Continuous Infusions:   Assessment/Plan:   1. Acute kidney injury on chronic kidney disease stage III.  Case discussed with Dr. Holley Raring nephrology.  Creatinine slowly improving.  Will need outpatient follow-up 2. Bilateral pleural effusions.  Thoracentesis of 1.2 L on the right lung.  Follow-up chest x-ray is improved.  Did not see any infiltrates.  Afebrile.  Normal WBC.  Will stop IV antibiotics. 3. Chronic diastolic congestive heart failure.  Need to watch closely at this point.  The patient does have lower extremity edema and bilateral pleural effusions.  Thoracentesis of 1.2 L today from the right lung.  TED hose ordered. 4. Hyperkalemia.  Given Veltassa.  Potassium has improved. 5. Type 2 diabetes mellitus.  Put on sliding  scale 6. Essential hypertension.  Holding meds at this point.  Blood pressure was a little low on presentation. 7. Hyperlipidemia unspecified on atorvastatin 8. GERD on Pepcid and Protonix 9. Anemia.  Continue to watch hemoglobin.  Hemoglobin 8.6 today after hydration yesterday.  Nephrology to consider Epogen. 10. Right leg pain.  Likely sciatica.  Ultrasound negative for DVT.  Nurse able to Doppler pulses. 11. Poor appetite.  Patient drinking some Glucerna's. 12. Depression.  Increase Celexa to 20 mg daily with his flat affect. 13. Dysphagia.  Will request swallow evaluation by speech therapist. 14. Acute bronchitis.  Start IV steroids.  Code Status:     Code Status Orders  (From admission, onward)         Start     Ordered   04/07/18 2104  Full code  Continuous     04/07/18 2103        Code Status History    Date Active Date Inactive Code Status Order ID Comments User Context   09/19/2017 1054 09/22/2017 1638 Full Code [id number]  Saundra Shelling, MD Inpatient   06/28/2017 0416 06/30/2017 1830 Full Code [id number]  Harrie Foreman, MD Inpatient     Family Communication: Spoke with wife at bedside.  Disposition Plan: Likely discharge home tomorrow  Time spent: 35 minutes  [place]
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
## 2018-04-12 NOTE — Care Management Important Message (Signed)
Important Message  Patient Details  Name: Jesse Dickson MRN: [id number] Date of Birth: 1940/06/24   Medicare Important Message Given:  Yes    Juliann Pulse A Justin Buechner 04/12/2018, 11:20 AM

## 2018-04-13 LAB — GLUCOSE, CAPILLARY: Glucose-Capillary: 185 mg/dL — ABNORMAL HIGH (ref 70–99)

## 2018-04-13 LAB — BASIC METABOLIC PANEL
ANION GAP: 9 (ref 5–15)
BUN: 49 mg/dL — ABNORMAL HIGH (ref 8–23)
CALCIUM: 9.2 mg/dL (ref 8.9–10.3)
CO2: 24 mmol/L (ref 22–32)
Chloride: 104 mmol/L (ref 98–111)
Creatinine, Ser: 2.39 mg/dL — ABNORMAL HIGH (ref 0.61–1.24)
GFR calc Af Amer: 30 mL/min — ABNORMAL LOW (ref >60)
GFR, EST NON AFRICAN AMERICAN: 26 mL/min — AB (ref >60)
Glucose, Bld: 237 mg/dL — ABNORMAL HIGH (ref 70–99)
POTASSIUM: 5.2 mmol/L — AB (ref 3.5–5.1)
Sodium: 137 mmol/L (ref 135–145)

## 2018-04-13 MED ORDER — SODIUM POLYSTYRENE SULFONATE 15 GM/60ML PO SUSP
30.0000 g | Freq: Once | ORAL | Status: AC
  Administered 2018-04-13: 30 g via ORAL
  Filled 2018-04-13: qty 120

## 2018-04-13 MED ORDER — GUAIFENESIN-DM 100-10 MG/5ML PO SYRP: 5.0000 mL | ORAL_SOLUTION | ORAL | 0 refills | Status: AC | PRN

## 2018-04-13 MED ORDER — PREDNISONE 10 MG (21) PO TBPK: ORAL_TABLET | ORAL | 0 refills | Status: AC

## 2018-04-13 MED ORDER — PATIROMER SORBITEX CALCIUM 8.4 G PO PACK
8.4000 g | PACK | Freq: Every day | ORAL | Status: DC
  Filled 2018-04-13: qty 1

## 2018-04-13 MED ORDER — GLUCERNA SHAKE PO LIQD: 237.0000 mL | Freq: Three times a day (TID) | ORAL | 0 refills | Status: AC

## 2018-04-13 NOTE — Consult Note (Signed)
[place], [place] 04/13/18  Subjective:  Patient seen at bedside. Creatinine slightly higher today at 2.39.   Objective:  Vital signs in last 24 hours:  Temp:  [97.8 F (36.6 C)-98.3 F (36.8 C)] 98.3 F (36.8 C) (02/07 1224) Pulse Rate:  [82-87] 84 (02/07 1224) Resp:  [18-19] 18 (02/07 1224) BP: (120-131)/(66-74) 128/74 (02/07 1224) SpO2:  [95 %-97 %] 97 % (02/07 1224) Weight:  [83.4 kg] 83.4 kg (02/07 0459)  Weight change: -2.767 kg Filed Weights   04/11/18 0547 04/12/18 0633 04/13/18 0459  Weight: 87.8 kg 86.2 kg 83.4 kg    Intake/Output:    Intake/Output Summary (Last 24 hours) at 04/13/2018 1226 Last data filed at 04/12/2018 1300 Gross per 24 hour  Intake 240 ml  Output -  Net 240 ml     Physical Exam: General:  No acute distress, sitting up in bed  HEENT  moist oral mucous membranes, anicteric  Neck  supple  Pulm/lungs  decreased breath sounds at bases, no crackles  CVS/Heart  regular, soft systolic murmur  Abdomen:   Soft, nontender  Extremities:  1+ pitting edema bilaterally  Neurologic:  Alert, oriented.  Follows commands  Skin:  No acute rashes    Basic Metabolic Panel:  Recent Labs  Lab 04/09/18 0357 04/10/18 0252 04/11/18 0354 04/12/18 0328 04/13/18 0402  NA 140 138 139 139 137  K 4.5 4.6 4.8 4.6 5.2*  CL 107 107 108 107 104  CO2 26 25 24 25 24   GLUCOSE 113* 113* 134* 160* 237*  BUN 40* 41* 41* 44* 49*  CREATININE 2.53* 2.44* 2.30* 2.21* 2.39*  CALCIUM 9.2 9.1 9.3 9.2 9.2  MG  --   --   --  2.2  --      CBC: Recent Labs  Lab 04/07/18 1441 04/09/18 0357 04/10/18 0252 04/12/18 0328  WBC 9.3 5.9  --  6.4  HGB 10.8* 8.6* 8.5* 9.0*  HCT 34.6* 27.1*  --  28.9*  MCV 94.3 93.8  --  94.1  PLT 215 197  --  218     No results found for: HEPBSAG, HEPBSAB, HEPBIGM    Microbiology:  Recent Results (from the past 240 hour(s))  Urine Culture     Status: None   Collection Time: 04/07/18  5:59 PM   Result Value Ref Range Status   Specimen Description URINE, RANDOM  Final   Special Requests   Final    Normal Performed at [facility], [address] [place], [place] [address]    Culture NO GROWTH  Final   Report Status 04/09/2018 FINAL  Final  Body fluid culture     Status: None (Preliminary result)   Collection Time: 04/11/18  2:51 PM  Result Value Ref Range Status   Specimen Description   Final    PLEURAL Performed at [facility], [address] [place], [place] [address]    Special Requests   Final    NONE Performed at [facility], [address] [place], [place] [address]    Gram Stain   Final    WBC PRESENT,BOTH PMN AND MONONUCLEAR NO ORGANISMS SEEN CYTOSPIN SMEAR    Culture   Final    NO GROWTH 2 DAYS Performed at [facility], [place] [address], [place], [place] [address]    Report Status PENDING  Incomplete    Coagulation Studies: No results for input(s): LABPROT, INR in the last 72 hours.  Urinalysis: No  results for input(s): COLORURINE, LABSPEC, [place], GLUCOSEU, HGBUR, BILIRUBINUR, KETONESUR, PROTEINUR, UROBILINOGEN, NITRITE, LEUKOCYTESUR in the last 72 hours.  Invalid input(s): APPERANCEUR    Imaging: Dg Chest Port 1 View  Result Date: 04/11/2018 CLINICAL DATA:  Post large volume right-sided thoracentesis. EXAM: PORTABLE CHEST 1 VIEW COMPARISON:  Chest radiograph-earlier same day; CT abdomen pelvis-04/07/2018 FINDINGS: Interval reduction/near resolution of persistent trace right-sided effusion post thoracentesis. No pneumothorax. Unchanged small to moderate-sized left-sided pleural effusion. Grossly unchanged enlarged cardiac silhouette and mediastinal contours. Improved aeration the right lung base with persistent bibasilar heterogeneous/consolidative opacities, left greater than right. No new focal airspace opacities. The pulmonary vasculature remains indistinct with cephalization of flow. No acute osseous  abnormalities. Apparent loose bodies overlie the anterior inferior aspect of the right glenohumeral joint without definitive donor sites identified. IMPRESSION: 1. Interval reduction/near resolution of persistent trace right-sided pleural effusion post thoracentesis. No pneumothorax. 2. Similar findings of cardiomegaly, pulmonary edema and small to moderate-sized left-sided pleural effusion. 3. Improved aeration the right lung base with persistent bibasilar opacities, left greater than right, atelectasis versus infiltrate. Electronically Signed   By: Sandi Mariscal M.D.   On: 04/11/2018 14:50   US Thoracentesis Asp Pleural Space W/img Guide  Result Date: 04/11/2018 INDICATION: Symptomatic right sided pleural effusion EXAM: US THORACENTESIS ASP PLEURAL SPACE W/IMG GUIDE COMPARISON:  Chest radiograph - earlier same day; CT abdomen pelvis - 04/07/2018 MEDICATIONS: None. COMPLICATIONS: None immediate. TECHNIQUE: Informed written consent was obtained from the patient after a discussion of the risks, benefits and alternatives to treatment. A timeout was performed prior to the initiation of the procedure. Initial ultrasound scanning demonstrates a moderate to large sized anechoic right-sided pleural effusion. The lower chest was prepped and draped in the usual sterile fashion. 1% lidocaine was used for local anesthesia. An ultrasound image was saved for documentation purposes. An 8 Fr Safe-T-Centesis catheter was introduced. The thoracentesis was performed. The catheter was removed and a dressing was applied. The patient tolerated the procedure well without immediate post procedural complication. The patient was escorted to have an upright chest radiograph. FINDINGS: A total of approximately 1.2 liters of serous fluid was removed. Requested samples were sent to the laboratory. IMPRESSION: Successful ultrasound-guided right sided thoracentesis yielding 1.2 liters of pleural fluid. Electronically Signed   By: Sandi Mariscal  M.D.   On: 04/11/2018 15:20     Medications:    . aspirin EC  81 mg Oral Daily  . atorvastatin  20 mg Oral QHS  . citalopram  20 mg Oral Daily  . famotidine  20 mg Oral Daily  . feeding supplement (GLUCERNA SHAKE)  237 mL Oral TID BM  . gabapentin  100 mg Oral Daily  . heparin  5,000 Units Subcutaneous Q8H  . insulin aspart  0-5 Units Subcutaneous QHS  . insulin aspart  0-9 Units Subcutaneous TID WC  . methylPREDNISolone (SOLU-MEDROL) injection  60 mg Intravenous Q24H  . pantoprazole  40 mg Oral Daily   acetaminophen **OR** acetaminophen, albuterol, guaiFENesin-dextromethorphan, ondansetron **OR** ondansetron (ZOFRAN) IV  Assessment/ Plan:  78 y.o. male  hyperlipidemia, diabetes mellitus type 2 greater than 20 years, chronic diastolic heart failure, GERD, anemia chronic kidney disease, prostate cancer, chronic low back pain, history of left lower extremity DVT   Acute kidney injury.  Cause is unclear. -Possibly related to hemodynamic changes versus urinary retention Renal ultrasound is negative for hydronephrosis, bladder wall thickening noted. -Renal function slightly worse today.  BUN 49 with a creatinine of 2.39.  He will  need continued monitoring of renal parameters as an outpatient.  Chronic kidney disease stage III.  Baseline creatinine 1.67/GFR 45 from October 12, 2017 -Likely secondary to atherosclerosis, hypertension and diabetes, consider adding ACE inhibitor or ARB as an outpatient.  Proteinuria As renal function continues to improve we will hopefully be able to add back an ARB as an outpatient.  Hyperkalemia Restart the patient on [place] 8.4 g p.o. daily.  DM-2 with CKD Hemoglobin A1c acceptable at 7.0.  Glycemic control per hospitalist.    LOS: 6 Jesse Dickson [place] 2/7/202012:26 PM  [place], [place]  Note: This note was prepared with Dragon dictation. Any transcription errors are unintentional

## 2018-04-13 NOTE — Care Management (Signed)
RNCM spoke with patient and she is "ready to return to home today".  Bedside commode has been delivered by [facility]. MD has agreed to add heart failure protocol to discharge plan.  Corene Cornea with [facility] aware of patient discharge to home today.  No other RNCM needs.

## 2018-04-13 NOTE — Discharge Instructions (Addendum)
Resume diet and activity as before.   Regular/mech soft foods (meats well-Cut and all foods moistened); thin liquids. General aspiration and REFLUX precautions.   Medication Administration: Whole meds with puree(for easier, safer swallowing as needed)

## 2018-04-13 NOTE — Discharge Planning (Signed)
Patient IV removed. RN assessment and VS revealed stability for DC to home with [facility].  Discharge papers given, explained and educated.  Informed of suggested FU appt and appt made. Scripts sent to [facility] [address], [place], per patient request.  Once ready, will be wheeled to front and family transporting home via car.

## 2018-04-13 NOTE — Care Management (Signed)
Patient will be followed by [facility] at discharge.  Please consider adding Heart Failure protocol. Corene Cornea with [facility] updated.

## 2018-04-13 NOTE — Progress Notes (Signed)
Discharge instructions and prescriptions given to patient and his wife at the bedside with verbalization of understanding. Time allowed for questions. Wife states that patient will not weigh himself. I spoke with patient to explain the importance of daily weights. Patient nodded his head. Wife will call son to pick them up. IV will then be d/c'd and patient wheeled.

## 2018-04-15 DIAGNOSIS — D472 Monoclonal gammopathy: Secondary | ICD-10-CM | POA: Diagnosis not present

## 2018-04-15 DIAGNOSIS — Z86718 Personal history of other venous thrombosis and embolism: Secondary | ICD-10-CM | POA: Diagnosis not present

## 2018-04-15 DIAGNOSIS — E860 Dehydration: Secondary | ICD-10-CM | POA: Diagnosis not present

## 2018-04-15 DIAGNOSIS — N183 Chronic kidney disease, stage 3 (moderate): Secondary | ICD-10-CM | POA: Diagnosis not present

## 2018-04-15 DIAGNOSIS — Z8546 Personal history of malignant neoplasm of prostate: Secondary | ICD-10-CM | POA: Diagnosis not present

## 2018-04-15 DIAGNOSIS — Z7982 Long term (current) use of aspirin: Secondary | ICD-10-CM | POA: Diagnosis not present

## 2018-04-15 DIAGNOSIS — I130 Hypertensive heart and chronic kidney disease with heart failure and stage 1 through stage 4 chronic kidney disease, or unspecified chronic kidney disease: Secondary | ICD-10-CM | POA: Diagnosis not present

## 2018-04-15 DIAGNOSIS — E1122 Type 2 diabetes mellitus with diabetic chronic kidney disease: Secondary | ICD-10-CM | POA: Diagnosis not present

## 2018-04-15 DIAGNOSIS — N390 Urinary tract infection, site not specified: Secondary | ICD-10-CM | POA: Diagnosis not present

## 2018-04-15 DIAGNOSIS — I5032 Chronic diastolic (congestive) heart failure: Secondary | ICD-10-CM | POA: Diagnosis not present

## 2018-04-15 LAB — BODY FLUID CULTURE: CULTURE: NO GROWTH

## 2018-04-17 DIAGNOSIS — Z7982 Long term (current) use of aspirin: Secondary | ICD-10-CM | POA: Diagnosis not present

## 2018-04-17 DIAGNOSIS — Z8546 Personal history of malignant neoplasm of prostate: Secondary | ICD-10-CM | POA: Diagnosis not present

## 2018-04-17 DIAGNOSIS — N390 Urinary tract infection, site not specified: Secondary | ICD-10-CM | POA: Diagnosis not present

## 2018-04-17 DIAGNOSIS — I5032 Chronic diastolic (congestive) heart failure: Secondary | ICD-10-CM | POA: Diagnosis not present

## 2018-04-17 DIAGNOSIS — I130 Hypertensive heart and chronic kidney disease with heart failure and stage 1 through stage 4 chronic kidney disease, or unspecified chronic kidney disease: Secondary | ICD-10-CM | POA: Diagnosis not present

## 2018-04-17 DIAGNOSIS — N183 Chronic kidney disease, stage 3 (moderate): Secondary | ICD-10-CM | POA: Diagnosis not present

## 2018-04-17 DIAGNOSIS — D472 Monoclonal gammopathy: Secondary | ICD-10-CM | POA: Diagnosis not present

## 2018-04-17 DIAGNOSIS — E860 Dehydration: Secondary | ICD-10-CM | POA: Diagnosis not present

## 2018-04-17 DIAGNOSIS — Z86718 Personal history of other venous thrombosis and embolism: Secondary | ICD-10-CM | POA: Diagnosis not present

## 2018-04-17 DIAGNOSIS — E1122 Type 2 diabetes mellitus with diabetic chronic kidney disease: Secondary | ICD-10-CM | POA: Diagnosis not present

## 2018-04-18 DIAGNOSIS — D631 Anemia in chronic kidney disease: Secondary | ICD-10-CM | POA: Diagnosis not present

## 2018-04-18 DIAGNOSIS — R0602 Shortness of breath: Secondary | ICD-10-CM | POA: Diagnosis not present

## 2018-04-18 DIAGNOSIS — R140 Abdominal distension (gaseous): Secondary | ICD-10-CM | POA: Diagnosis not present

## 2018-04-18 DIAGNOSIS — N183 Chronic kidney disease, stage 3 (moderate): Secondary | ICD-10-CM | POA: Diagnosis not present

## 2018-04-19 DIAGNOSIS — I890 Lymphedema, not elsewhere classified: Secondary | ICD-10-CM | POA: Diagnosis not present

## 2018-04-20 DIAGNOSIS — I130 Hypertensive heart and chronic kidney disease with heart failure and stage 1 through stage 4 chronic kidney disease, or unspecified chronic kidney disease: Secondary | ICD-10-CM | POA: Diagnosis not present

## 2018-04-20 DIAGNOSIS — Z7982 Long term (current) use of aspirin: Secondary | ICD-10-CM | POA: Diagnosis not present

## 2018-04-20 DIAGNOSIS — R34 Anuria and oliguria: Secondary | ICD-10-CM | POA: Diagnosis not present

## 2018-04-20 DIAGNOSIS — E785 Hyperlipidemia, unspecified: Secondary | ICD-10-CM | POA: Diagnosis not present

## 2018-04-20 DIAGNOSIS — D472 Monoclonal gammopathy: Secondary | ICD-10-CM | POA: Diagnosis not present

## 2018-04-20 DIAGNOSIS — I509 Heart failure, unspecified: Secondary | ICD-10-CM | POA: Diagnosis not present

## 2018-04-20 DIAGNOSIS — E1122 Type 2 diabetes mellitus with diabetic chronic kidney disease: Secondary | ICD-10-CM | POA: Diagnosis not present

## 2018-04-20 DIAGNOSIS — I5032 Chronic diastolic (congestive) heart failure: Secondary | ICD-10-CM | POA: Diagnosis not present

## 2018-04-20 DIAGNOSIS — N183 Chronic kidney disease, stage 3 (moderate): Secondary | ICD-10-CM | POA: Diagnosis not present

## 2018-04-20 DIAGNOSIS — Z8546 Personal history of malignant neoplasm of prostate: Secondary | ICD-10-CM | POA: Diagnosis not present

## 2018-04-20 DIAGNOSIS — E860 Dehydration: Secondary | ICD-10-CM | POA: Diagnosis not present

## 2018-04-20 DIAGNOSIS — N390 Urinary tract infection, site not specified: Secondary | ICD-10-CM | POA: Diagnosis not present

## 2018-04-20 DIAGNOSIS — M5440 Lumbago with sciatica, unspecified side: Secondary | ICD-10-CM | POA: Diagnosis not present

## 2018-04-20 DIAGNOSIS — Z86718 Personal history of other venous thrombosis and embolism: Secondary | ICD-10-CM | POA: Diagnosis not present

## 2018-04-23 DIAGNOSIS — R69 Illness, unspecified: Secondary | ICD-10-CM | POA: Diagnosis not present

## 2018-04-23 NOTE — Discharge Summary (Signed)
[facility] NAME: Jesse Dickson    MR#:  [id number]  DATE OF BIRTH:  [birth year]  DATE OF ADMISSION:  04/07/2018 ADMITTING PHYSICIAN: Fritzi Mandes, MD  DATE OF DISCHARGE: 04/13/2018  2:45 PM  PRIMARY CARE PHYSICIAN: Cletis Athens, MD   ADMISSION DIAGNOSIS:  Abdominal pain, unspecified abdominal location [R10.9] Urinary tract infection without hematuria, site unspecified [N39.0] Acute renal failure, unspecified acute renal failure type ([place]) [N17.9]  DISCHARGE DIAGNOSIS:  Active Problems:   Acute on chronic renal failure ([place])   SECONDARY DIAGNOSIS:   Past Medical History:  Diagnosis Date  . Chronic diastolic CHF (congestive heart failure) ([place])    a. TTE 05/2015 EF > 55%  . Chronic kidney disease (CKD), stage III (moderate) ([facility])   . DVT (deep venous thrombosis) ([place])   . GERD (gastroesophageal reflux disease)   . Hypercholesteremia   . Hypertension   . Insulin dependent diabetes mellitus ([place])   . MGUS (monoclonal gammopathy of unknown significance)   . Prostate CA ([facility]) 2015   only surgery per pt     ADMITTING HISTORY  HISTORY OF PRESENT ILLNESS:  Jesse Dickson  is a 78 y.o. male with a known history of hypertension, chronic diastolic congestive heart failure, hypercholesterolemia, type II diabetes, CKD stage III comes to the emergency room with left flank pain for 2 to 3 days. Patient says his urine is dark. Denies any constipation. Denies any fever. In the ER workup revealed patient has UTI and acute on chronic dehydration. CT scan of the abdomen was unremarkable other than bilateral pleural effusion with atelectasis likely chronic  patient received IV fluid. He also received IV Rocephin he is being admitted for further evaluation of management.  wife and son are present in the ER.   HOSPITAL COURSE:   1. Acute kidney injury on chronic kidney disease stage III.  Case discussed with Dr. Holley Raring nephrology.   Creatinine slowly improving.  Will need outpatient follow-up 2. Bilateral pleural effusions.  Thoracentesis of 1.2 L on the right lung.  Follow-up chest x-ray is improved.  Did not see any infiltrates.  Afebrile.  Normal WBC.    Stop antibiotics. 3. Chronic diastolic congestive heart failure.    Signs of fluid overload. 4. Hyperkalemia.  Given V[place]assa.  Potassium has improved.  Low up with nephrology as outpatient 5. Type 2 diabetes mellitus.  Put on sliding scale 6. Essential hypertension.  Losartan held due to hyperkalemia. 7. Anemia.  Continue to watch hemoglobin.  Hemoglobin 8.6 today after hydration yesterday.  Nephrology to consider Epogen. 8. Right leg pain.  Likely sciatica.  Ultrasound negative for DVT.  Nurse able to Doppler pulses. 9. Dysphagia.  Will request swallow evaluation by speech therapist.  Patient to be on chopped meat.  Chronic issue.  On PPI 10. Acute bronchitis.  Started IV steroids.  Change to oral prednisone at discharge.  Not needing oxygen.  Wheezing showed significant improvement by the time of discharge.  Patient discharged home in stable condition.  With physical therapy home health.  CONSULTS OBTAINED:    DRUG ALLERGIES:  No Known Allergies  DISCHARGE MEDICATIONS:   Allergies as of 04/13/2018   No Known Allergies     Medication List    STOP taking these medications   furosemide 40 MG tablet Commonly known as:  LASIX   losartan 25 MG tablet Commonly known as:  COZAAR     TAKE these medications   aspirin EC 81 MG  tablet Take 81 mg by mouth daily.   atorvastatin 20 MG tablet Commonly known as:  LIPITOR Take 20 mg by mouth at bedtime.   carvedilol 3.125 MG tablet Commonly known as:  COREG Take 1 tablet (3.125 mg total) by mouth 2 (two) times daily.   citalopram 10 MG tablet Commonly known as:  CELEXA Take 10 mg by mouth daily.   feeding supplement (GLUCERNA SHAKE) Liqd Take 237 mLs by mouth 3 (three) times daily between meals.    gabapentin 100 MG capsule Commonly known as:  NEURONTIN Take 200 mg by mouth daily.   guaiFENesin-dextromethorphan 100-10 MG/5ML syrup Commonly known as:  ROBITUSSIN DM Take 5 mLs by mouth every 4 (four) hours as needed for cough.   linaclotide 290 MCG Caps capsule Commonly known as:  LINZESS Take 1 capsule (290 mcg total) by mouth daily before breakfast.   omeprazole 20 MG capsule Commonly known as:  PRILOSEC Take 20 mg by mouth daily.   predniSONE 10 MG (21) Tbpk tablet Commonly known as:  STERAPRED UNI-PAK 21 TAB 6 tabs day 1 and taper 10 mg a day - 6 days   TRESIBA FLEXTOUCH 100 UNIT/ML Sopn FlexTouch Pen Generic drug:  insulin degludec Inject 30 Units into the skin daily as needed (hyperglycemia).   VENTOLIN HFA 108 (90 Base) MCG/ACT inhaler Generic drug:  albuterol INHALE 1 PUFF BY MOUTH 3 TIMES A DAY AS NEEDED       Today   VITAL SIGNS:  Blood pressure 128/74, pulse 84, temperature 98.3 F (36.8 C), temperature source Oral, resp. rate 18, height 5\' 8"  (1.727 m), weight 83.4 kg, SpO2 97 %.  I/O:  No intake or output data in the 24 hours ending 04/23/18 1319  PHYSICAL EXAMINATION:  Physical Exam  GENERAL:  78 y.o.-year-old patient lying in the bed with no acute distress.  LUNGS: Normal breath sounds bilaterally, no wheezing, rales,rhonchi or crepitation. No use of accessory muscles of respiration.  CARDIOVASCULAR: S1, S2 normal. No murmurs, rubs, or gallops.  ABDOMEN: Soft, non-tender, non-distended. Bowel sounds present. No organomegaly or mass.  NEUROLOGIC: Moves all 4 extremities. PSYCHIATRIC: The patient is alert and oriented x 3.  SKIN: No obvious rash, lesion, or ulcer.   DATA REVIEW:   CBC No results for input(s): WBC, HGB, HCT, PLT in the last 168 hours.  Chemistries  No results for input(s): NA, K, CL, CO2, GLUCOSE, BUN, CREATININE, CALCIUM, MG, AST, ALT, ALKPHOS, BILITOT in the last 168 hours.  Invalid input(s): GFRCGP  Cardiac Enzymes No  results for input(s): TROPONINI in the last 168 hours.  Microbiology Results  Results for orders placed or performed during the hospital encounter of 04/07/18  Urine Culture     Status: None   Collection Time: 04/07/18  5:59 PM  Result Value Ref Range Status   Specimen Description URINE, RANDOM  Final   Special Requests   Final    Normal Performed at [facility], [address] [place], [place] [address]    Culture NO GROWTH  Final   Report Status 04/09/2018 FINAL  Final  Body fluid culture     Status: None   Collection Time: 04/11/18  2:51 PM  Result Value Ref Range Status   Specimen Description   Final    PLEURAL Performed at [facility], [address], [place], [place] [address]    Special Requests   Final    NONE Performed at [facility], [address], [place], [place] [address]  Gram Stain   Final    WBC PRESENT,BOTH PMN AND MONONUCLEAR NO ORGANISMS SEEN CYTOSPIN SMEAR    Culture   Final    NO GROWTH 3 DAYS Performed at [facility], [address] [place], [place] [address]    Report Status 04/15/2018 FINAL  Final    RADIOLOGY:  No results found.  Follow up with PCP in 1 week.  Management plans discussed with the patient, family and they are in agreement.  CODE STATUS:  Code Status History    Date Active Date Inactive Code Status Order ID Comments User Context   04/07/2018 2103 04/13/2018 1814 Full Code [id number]  Fritzi Mandes, MD ED   09/19/2017 1054 09/22/2017 1638 Full Code [id number]  Saundra Shelling, MD Inpatient   06/28/2017 0416 06/30/2017 1830 Full Code [id number]  Harrie Foreman, MD Inpatient      TOTAL TIME TAKING CARE OF THIS PATIENT ON DAY OF DISCHARGE: more than 30 minutes.   Leia Alf Pearse Shiffler M.D on 04/23/2018 at 1:19 PM  Between 7am to 6pm - Pager - [phone]  After 6pm go to [email or web address] - password [email or web address] [facility]  Office  [phone]  CC: Primary care physician;  Cletis Athens, MD  Note: This dictation was prepared with Dragon dictation along with smaller phrase technology. Any transcriptional errors that result from this process are unintentional.

## 2018-04-24 DIAGNOSIS — N183 Chronic kidney disease, stage 3 (moderate): Secondary | ICD-10-CM | POA: Diagnosis not present

## 2018-04-24 DIAGNOSIS — I130 Hypertensive heart and chronic kidney disease with heart failure and stage 1 through stage 4 chronic kidney disease, or unspecified chronic kidney disease: Secondary | ICD-10-CM | POA: Diagnosis not present

## 2018-04-24 DIAGNOSIS — Z7982 Long term (current) use of aspirin: Secondary | ICD-10-CM | POA: Diagnosis not present

## 2018-04-24 DIAGNOSIS — I5032 Chronic diastolic (congestive) heart failure: Secondary | ICD-10-CM | POA: Diagnosis not present

## 2018-04-24 DIAGNOSIS — Z8546 Personal history of malignant neoplasm of prostate: Secondary | ICD-10-CM | POA: Diagnosis not present

## 2018-04-24 DIAGNOSIS — Z86718 Personal history of other venous thrombosis and embolism: Secondary | ICD-10-CM | POA: Diagnosis not present

## 2018-04-24 DIAGNOSIS — N390 Urinary tract infection, site not specified: Secondary | ICD-10-CM | POA: Diagnosis not present

## 2018-04-24 DIAGNOSIS — D472 Monoclonal gammopathy: Secondary | ICD-10-CM | POA: Diagnosis not present

## 2018-04-24 DIAGNOSIS — E1122 Type 2 diabetes mellitus with diabetic chronic kidney disease: Secondary | ICD-10-CM | POA: Diagnosis not present

## 2018-04-24 DIAGNOSIS — E860 Dehydration: Secondary | ICD-10-CM | POA: Diagnosis not present

## 2018-04-26 DIAGNOSIS — E1122 Type 2 diabetes mellitus with diabetic chronic kidney disease: Secondary | ICD-10-CM | POA: Diagnosis not present

## 2018-04-26 DIAGNOSIS — D472 Monoclonal gammopathy: Secondary | ICD-10-CM | POA: Diagnosis not present

## 2018-04-26 DIAGNOSIS — E860 Dehydration: Secondary | ICD-10-CM | POA: Diagnosis not present

## 2018-04-26 DIAGNOSIS — Z7982 Long term (current) use of aspirin: Secondary | ICD-10-CM | POA: Diagnosis not present

## 2018-04-26 DIAGNOSIS — I5032 Chronic diastolic (congestive) heart failure: Secondary | ICD-10-CM | POA: Diagnosis not present

## 2018-04-26 DIAGNOSIS — Z86718 Personal history of other venous thrombosis and embolism: Secondary | ICD-10-CM | POA: Diagnosis not present

## 2018-04-26 DIAGNOSIS — I130 Hypertensive heart and chronic kidney disease with heart failure and stage 1 through stage 4 chronic kidney disease, or unspecified chronic kidney disease: Secondary | ICD-10-CM | POA: Diagnosis not present

## 2018-04-26 DIAGNOSIS — N183 Chronic kidney disease, stage 3 (moderate): Secondary | ICD-10-CM | POA: Diagnosis not present

## 2018-04-26 DIAGNOSIS — N390 Urinary tract infection, site not specified: Secondary | ICD-10-CM | POA: Diagnosis not present

## 2018-04-26 DIAGNOSIS — Z8546 Personal history of malignant neoplasm of prostate: Secondary | ICD-10-CM | POA: Diagnosis not present

## 2018-05-06 DEATH — deceased

## 2018-05-08 ENCOUNTER — Ambulatory Visit: Payer: Medicare HMO | Admitting: Nurse Practitioner

## 2019-03-30 IMAGING — MR MR HEAD W/O CM
10 series · 48 of 48 positions shown · non-contrast
Comparison: None.

CLINICAL DATA: 76 y/o M; chest pain and syncope. History of MGUS
and prostate cancer.

EXAM:
MRI HEAD WITHOUT CONTRAST
TECHNIQUE: Multiplanar, multiecho pulse sequences of the brain and surrounding
structures were obtained without intravenous contrast.

[Series 3: DWI · axial · 3.0mm · 1.80mm/px · z∈[-55,+102]mm · 7 of 54 slices shown (1 of 2)]
[im 1/54]
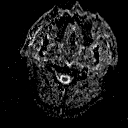
[im 9/54]
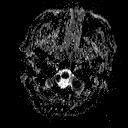
[im 18/54]
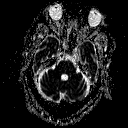
[im 27/54]
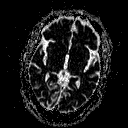
[im 36/54]
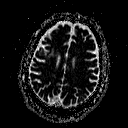
[im 45/54]
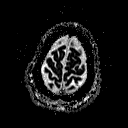
[im 54/54]
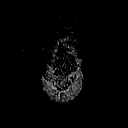

[Series 5: DWI · coronal · 3.0mm · 1.80mm/px · 6 of 48 slices shown (2 of 2)]
[im 1/48]
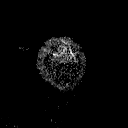
[im 10/48]
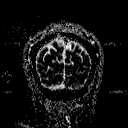
[im 19/48]
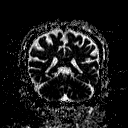
[im 29/48]
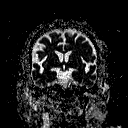
[im 38/48]
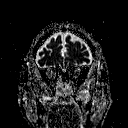
[im 48/48]
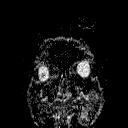

[Series 6: FLAIR · axial · 5.0mm · 0.45mm/px · z∈[-58,+106]mm · 3 of 27 slices shown]
[im 1/27]
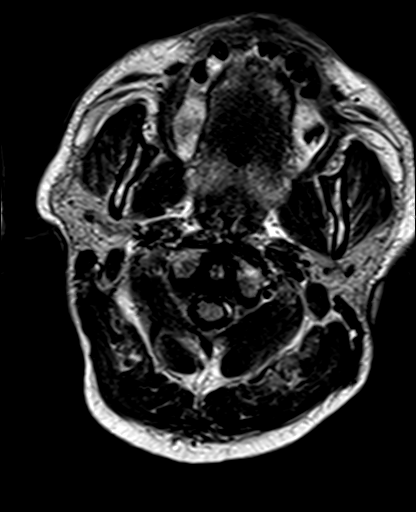
[im 14/27]
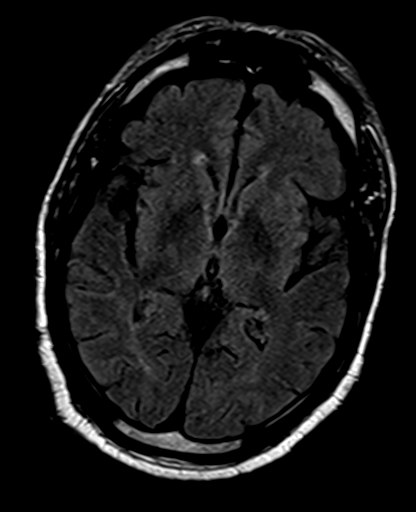
[im 27/27]
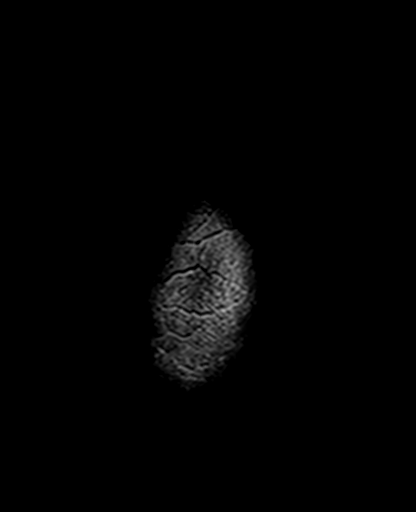

[Series 7: T2 · axial · 5.0mm · 0.45mm/px · z∈[-58,+106]mm · 3 of 27 slices shown (1 of 3)]
[im 1/27]
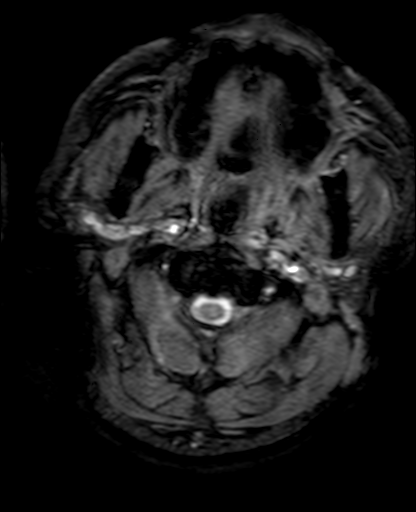
[im 14/27]
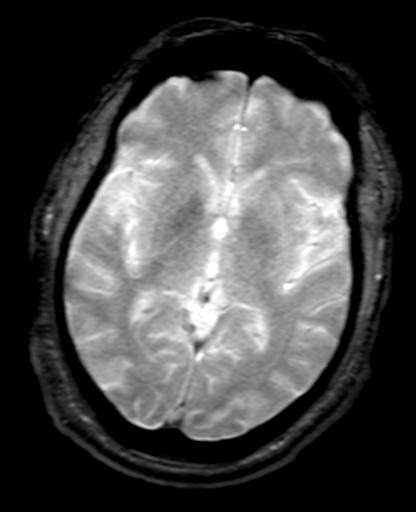
[im 27/27]
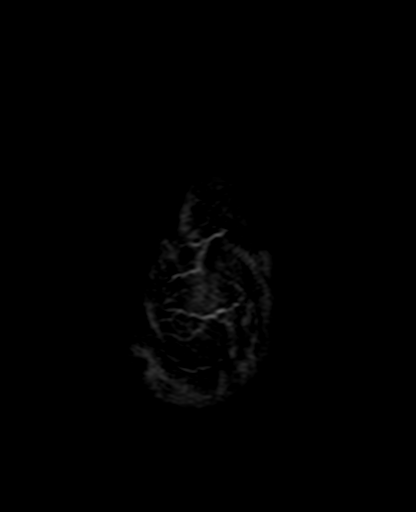

[Series 8: T2 · axial · 5.0mm · 0.90mm/px · z∈[-58,+106]mm · 3 of 27 slices shown (2 of 3)]
[im 1/27]
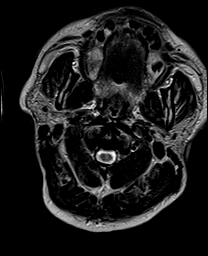
[im 14/27]
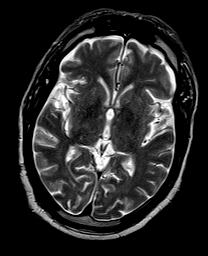
[im 27/27]
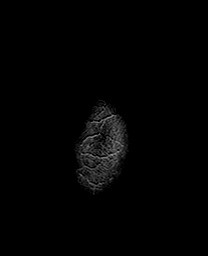

[Series 9: T1 · sagittal · 5.0mm · 0.45mm/px · 3 of 29 slices shown (1 of 2)]
[im 1/29]
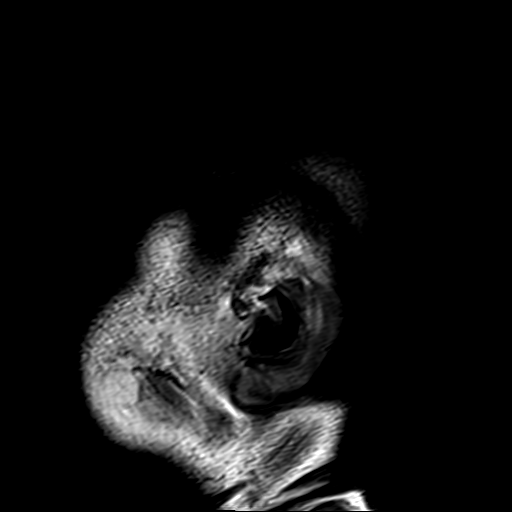
[im 15/29]
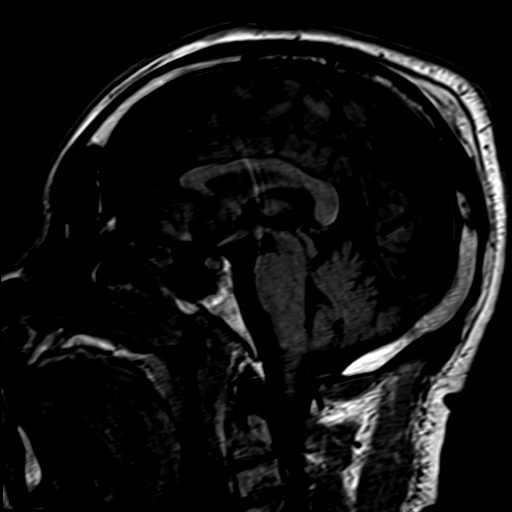
[im 29/29]
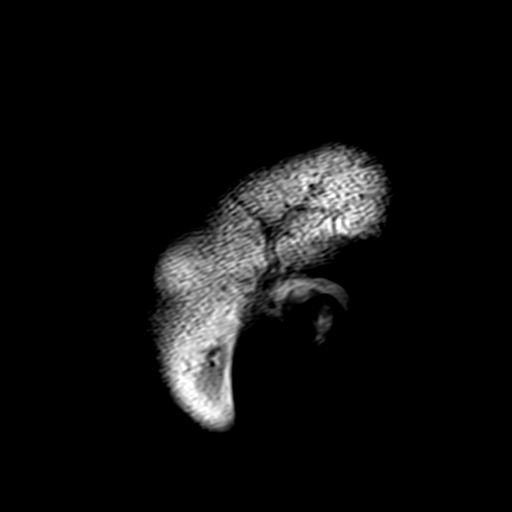

[Series 10: T1 · axial · 3.0mm · 1.00mm/px · z∈[-59,+113]mm · 7 of 60 slices shown (2 of 2)]
[im 1/60]
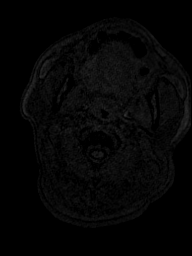
[im 10/60]
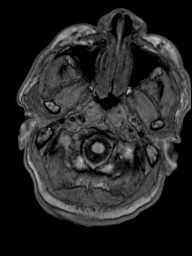
[im 20/60]
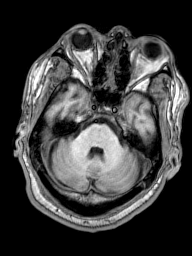
[im 30/60]
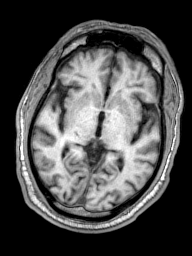
[im 40/60]
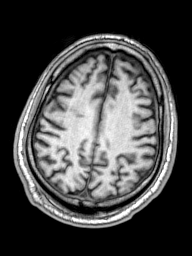
[im 50/60]
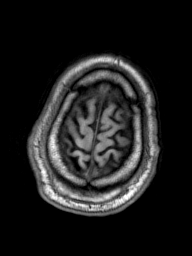
[im 60/60]
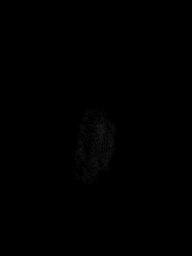

[Series 11: T2 · coronal · 5.0mm · 0.86mm/px · 4 of 31 slices shown (3 of 3)]
[im 1/31]
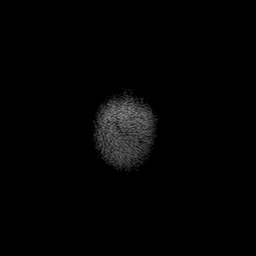
[im 11/31]
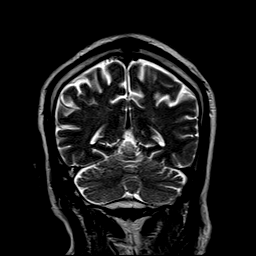
[im 21/31]
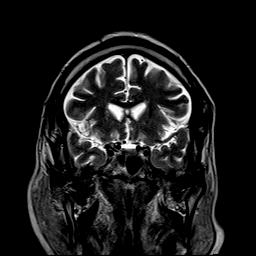
[im 31/31]
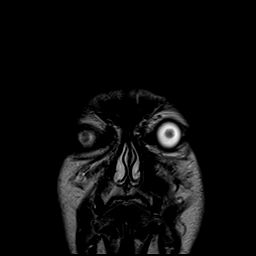

[Series 100: ax (id) · axial · 3.0mm · 1.80mm/px · z∈[-55,+102]mm · 6 of 55 slices shown]
[im 1/55]
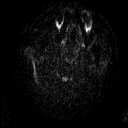
[im 11/55]
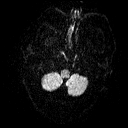
[im 22/55]
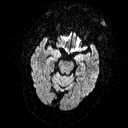
[im 33/55]
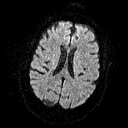
[im 44/55]
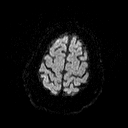
[im 55/55]
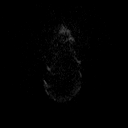

[Series 101: cor (id) · coronal · 3.0mm · 1.80mm/px · 6 of 49 slices shown]
[im 1/49]
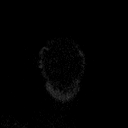
[im 10/49]
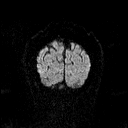
[im 20/49]
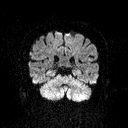
[im 29/49]
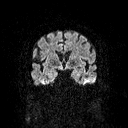
[im 39/49]
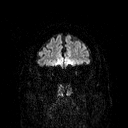
[im 49/49]
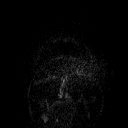

[48 of 48 positions shown; findings below may reference images not displayed]

FINDINGS: Brain: No acute infarction, hemorrhage, hydrocephalus, extra-axial
collection or mass lesion. Small chronic infarction within the right
frontal operculum extending to periventricular white matter.

Vascular: Normal flow voids.

Skull and upper cervical spine: Normal marrow signal.

Sinuses/Orbits: Mild diffuse paranasal sinus mucosal thickening.
Partial opacification of mastoid air cells. Orbits are unremarkable.

Other: None.
IMPRESSION: 1. No acute intracranial abnormality identified.
2. Small chronic infarction within the right frontal operculum.
3. Mild diffuse paranasal sinus disease and partial opacification of
mastoid air cells.

By: Edelene Urano M.D.
